# Patient Record
Sex: Female | Born: 1960 | Race: Black or African American | Hispanic: No | Smoking: Current every day smoker
Health system: Southern US, Community
[De-identification: ages and names within clinical notes are randomized; demographics above are authoritative.]

## PROBLEM LIST (undated history)

## (undated) DIAGNOSIS — E119 Type 2 diabetes mellitus without complications: Secondary | ICD-10-CM

## (undated) DIAGNOSIS — I10 Essential (primary) hypertension: Secondary | ICD-10-CM

---

## 2000-10-21 ENCOUNTER — Emergency Department (HOSPITAL_COMMUNITY): Admission: EM | Admit: 2000-10-21 | Discharge: 2000-10-21 | Payer: Self-pay | Admitting: Emergency Medicine

## 2001-04-04 ENCOUNTER — Encounter: Payer: Self-pay | Admitting: Emergency Medicine

## 2001-04-04 ENCOUNTER — Emergency Department (HOSPITAL_COMMUNITY): Admission: EM | Admit: 2001-04-04 | Discharge: 2001-04-04 | Payer: Self-pay | Admitting: Emergency Medicine

## 2002-02-25 ENCOUNTER — Emergency Department (HOSPITAL_COMMUNITY): Admission: EM | Admit: 2002-02-25 | Discharge: 2002-02-25 | Payer: Self-pay | Admitting: *Deleted

## 2004-05-08 ENCOUNTER — Emergency Department (HOSPITAL_COMMUNITY): Admission: EM | Admit: 2004-05-08 | Discharge: 2004-05-08 | Payer: Self-pay | Admitting: *Deleted

## 2004-07-21 ENCOUNTER — Emergency Department (HOSPITAL_COMMUNITY): Admission: EM | Admit: 2004-07-21 | Discharge: 2004-07-21 | Payer: Self-pay | Admitting: Emergency Medicine

## 2004-08-25 ENCOUNTER — Emergency Department (HOSPITAL_COMMUNITY): Admission: EM | Admit: 2004-08-25 | Discharge: 2004-08-26 | Payer: Self-pay | Admitting: Emergency Medicine

## 2005-09-23 ENCOUNTER — Emergency Department (HOSPITAL_COMMUNITY): Admission: EM | Admit: 2005-09-23 | Discharge: 2005-09-23 | Payer: Self-pay | Admitting: *Deleted

## 2007-04-12 ENCOUNTER — Ambulatory Visit: Payer: Self-pay | Admitting: Family Medicine

## 2007-04-12 ENCOUNTER — Ambulatory Visit: Payer: Self-pay | Admitting: *Deleted

## 2007-05-12 ENCOUNTER — Ambulatory Visit: Payer: Self-pay | Admitting: Family Medicine

## 2007-05-12 LAB — CONVERTED CEMR LAB
ALT: 12 units/L (ref 0–35)
AST: 15 units/L (ref 0–37)
Albumin: 4 g/dL (ref 3.5–5.2)
Basophils Absolute: 0 10*3/uL (ref 0.0–0.1)
CO2: 25 meq/L (ref 19–32)
Calcium: 9.4 mg/dL (ref 8.4–10.5)
Creatinine, Ser: 0.85 mg/dL (ref 0.40–1.20)
HCT: 49 % — ABNORMAL HIGH (ref 36.0–46.0)
HDL: 52 mg/dL (ref >39)
Lymphocytes Relative: 37 % (ref 12–46)
MCHC: 32.2 g/dL (ref 30.0–36.0)
MCV: 106.1 fL — ABNORMAL HIGH (ref 78.0–100.0)
Monocytes Absolute: 0.6 10*3/uL (ref 0.2–0.7)
Monocytes Relative: 9 % (ref 3–11)
Neutro Abs: 3.4 10*3/uL (ref 1.7–7.7)
Potassium: 5.1 meq/L (ref 3.5–5.3)
RDW: 14.6 % — ABNORMAL HIGH (ref 11.5–14.0)
TSH: 1.493 microintl units/mL (ref 0.350–5.50)
Total CHOL/HDL Ratio: 4.3
Triglycerides: 300 mg/dL — ABNORMAL HIGH (ref <150)

## 2008-08-06 ENCOUNTER — Emergency Department (HOSPITAL_COMMUNITY): Admission: EM | Admit: 2008-08-06 | Discharge: 2008-08-07 | Payer: Self-pay | Admitting: Emergency Medicine

## 2008-11-19 ENCOUNTER — Emergency Department (HOSPITAL_COMMUNITY): Admission: EM | Admit: 2008-11-19 | Discharge: 2008-11-19 | Payer: Self-pay | Admitting: Emergency Medicine

## 2008-12-10 ENCOUNTER — Ambulatory Visit: Payer: Self-pay | Admitting: Internal Medicine

## 2009-10-02 ENCOUNTER — Emergency Department (HOSPITAL_COMMUNITY): Admission: EM | Admit: 2009-10-02 | Discharge: 2009-10-02 | Payer: Self-pay | Admitting: Family Medicine

## 2009-11-12 ENCOUNTER — Emergency Department (HOSPITAL_COMMUNITY): Admission: EM | Admit: 2009-11-12 | Discharge: 2009-11-12 | Payer: Self-pay | Admitting: Family Medicine

## 2009-11-26 ENCOUNTER — Ambulatory Visit: Payer: Self-pay | Admitting: Family Medicine

## 2009-11-26 ENCOUNTER — Encounter (INDEPENDENT_AMBULATORY_CARE_PROVIDER_SITE_OTHER): Payer: Self-pay | Admitting: Adult Health

## 2009-11-26 ENCOUNTER — Ambulatory Visit: Payer: Self-pay | Admitting: Internal Medicine

## 2009-11-26 LAB — CONVERTED CEMR LAB
BUN: 12 mg/dL (ref 6–23)
CO2: 28 meq/L (ref 19–32)
Cholesterol: 281 mg/dL — ABNORMAL HIGH (ref 0–200)
HDL: 50 mg/dL (ref >39)
LDL Cholesterol: 180 mg/dL — ABNORMAL HIGH (ref 0–99)
Microalb, Ur: 4.8 mg/dL — ABNORMAL HIGH (ref 0.00–1.89)
Sodium: 138 meq/L (ref 135–145)
Total Bilirubin: 0.7 mg/dL (ref 0.3–1.2)
Triglycerides: 257 mg/dL — ABNORMAL HIGH (ref <150)
VLDL: 51 mg/dL — ABNORMAL HIGH (ref 0–40)

## 2010-01-28 ENCOUNTER — Ambulatory Visit: Payer: Self-pay | Admitting: Family Medicine

## 2010-01-28 LAB — CONVERTED CEMR LAB
BUN: 15 mg/dL (ref 6–23)
Chloride: 101 meq/L (ref 96–112)
Sodium: 140 meq/L (ref 135–145)
Vit D, 25-Hydroxy: 20 ng/mL — ABNORMAL LOW (ref 30–89)

## 2010-06-22 ENCOUNTER — Inpatient Hospital Stay (HOSPITAL_COMMUNITY): Admission: EM | Admit: 2010-06-22 | Discharge: 2010-06-24 | Payer: Self-pay | Admitting: Emergency Medicine

## 2010-10-09 ENCOUNTER — Inpatient Hospital Stay (INDEPENDENT_AMBULATORY_CARE_PROVIDER_SITE_OTHER)
Admission: RE | Admit: 2010-10-09 | Discharge: 2010-10-09 | Disposition: A | Discharge status: Home / Self Care | Payer: Self-pay | Source: Ambulatory Visit | Attending: Emergency Medicine | Admitting: Emergency Medicine

## 2010-10-09 DIAGNOSIS — I10 Essential (primary) hypertension: Secondary | ICD-10-CM

## 2010-10-09 DIAGNOSIS — Z760 Encounter for issue of repeat prescription: Secondary | ICD-10-CM

## 2010-10-21 LAB — CBC
HCT: 40.5 % (ref 36.0–46.0)
Hemoglobin: 13.9 g/dL (ref 12.0–15.0)
MCH: 33.3 pg (ref 26.0–34.0)
MCV: 96.8 fL (ref 78.0–100.0)
MCV: 96.8 fL (ref 78.0–100.0)
Platelets: 314 10*3/uL (ref 150–400)
RBC: 4.18 MIL/uL (ref 3.87–5.11)
RBC: 4.57 MIL/uL (ref 3.87–5.11)
RDW: 13.2 % (ref 11.5–15.5)
WBC: 8.2 10*3/uL (ref 4.0–10.5)
WBC: 8.7 10*3/uL (ref 4.0–10.5)

## 2010-10-21 LAB — BASIC METABOLIC PANEL
BUN: 3 mg/dL — ABNORMAL LOW (ref 6–23)
CO2: 26 mEq/L (ref 19–32)
CO2: 30 mEq/L (ref 19–32)
Calcium: 9.5 mg/dL (ref 8.4–10.5)
Chloride: 101 mEq/L (ref 96–112)
Creatinine, Ser: 0.87 mg/dL (ref 0.4–1.2)
GFR calc Af Amer: >60 mL/min (ref >60)
GFR calc non Af Amer: >60 mL/min (ref >60)
GFR calc non Af Amer: >60 mL/min (ref >60)
Glucose, Bld: 125 mg/dL — ABNORMAL HIGH (ref 70–99)
Potassium: 3.1 mEq/L — ABNORMAL LOW (ref 3.5–5.1)

## 2010-10-21 LAB — BLOOD GAS, ARTERIAL
Acid-Base Excess: 2.9 mmol/L — ABNORMAL HIGH (ref 0.0–2.0)
Bicarbonate: 25.6 mEq/L — ABNORMAL HIGH (ref 20.0–24.0)
Drawn by: 30996
FIO2: 0.21 %
O2 Saturation: 87.6 %
pCO2 arterial: 34.4 mmHg — ABNORMAL LOW (ref 35.0–45.0)
pO2, Arterial: 51.9 mmHg — ABNORMAL LOW (ref 80.0–100.0)

## 2010-10-21 LAB — DIFFERENTIAL
Monocytes Relative: 8 % (ref 3–12)
Neutro Abs: 5.7 10*3/uL (ref 1.7–7.7)
Neutrophils Relative %: 66 % (ref 43–77)

## 2010-10-21 LAB — BRAIN NATRIURETIC PEPTIDE: Pro B Natriuretic peptide (BNP): 65.5 pg/mL (ref 0.0–100.0)

## 2011-05-14 LAB — URINE MICROSCOPIC-ADD ON

## 2011-05-14 LAB — POCT I-STAT, CHEM 8
BUN: 4 mg/dL — ABNORMAL LOW (ref 6–23)
Creatinine, Ser: 1 mg/dL (ref 0.4–1.2)
Glucose, Bld: 103 mg/dL — ABNORMAL HIGH (ref 70–99)
Hemoglobin: 17 g/dL — ABNORMAL HIGH (ref 12.0–15.0)
Sodium: 140 mEq/L (ref 135–145)

## 2011-05-14 LAB — URINALYSIS, ROUTINE W REFLEX MICROSCOPIC
Ketones, ur: NEGATIVE mg/dL
Protein, ur: NEGATIVE mg/dL
Urobilinogen, UA: 1 mg/dL (ref 0.0–1.0)
pH: 6.5 (ref 5.0–8.0)

## 2013-03-30 ENCOUNTER — Emergency Department (HOSPITAL_COMMUNITY)
Admission: EM | Admit: 2013-03-30 | Discharge: 2013-03-30 | Disposition: A | Discharge status: Home / Self Care | Payer: No Typology Code available for payment source | Attending: Emergency Medicine | Admitting: Emergency Medicine

## 2013-03-30 ENCOUNTER — Encounter (HOSPITAL_COMMUNITY): Payer: Self-pay | Admitting: *Deleted

## 2013-03-30 DIAGNOSIS — M25519 Pain in unspecified shoulder: Secondary | ICD-10-CM | POA: Insufficient documentation

## 2013-03-30 DIAGNOSIS — M25511 Pain in right shoulder: Secondary | ICD-10-CM

## 2013-03-30 DIAGNOSIS — E119 Type 2 diabetes mellitus without complications: Secondary | ICD-10-CM | POA: Insufficient documentation

## 2013-03-30 DIAGNOSIS — M79605 Pain in left leg: Secondary | ICD-10-CM

## 2013-03-30 DIAGNOSIS — Z79899 Other long term (current) drug therapy: Secondary | ICD-10-CM | POA: Insufficient documentation

## 2013-03-30 DIAGNOSIS — M549 Dorsalgia, unspecified: Secondary | ICD-10-CM | POA: Insufficient documentation

## 2013-03-30 DIAGNOSIS — F17200 Nicotine dependence, unspecified, uncomplicated: Secondary | ICD-10-CM | POA: Insufficient documentation

## 2013-03-30 DIAGNOSIS — M79609 Pain in unspecified limb: Secondary | ICD-10-CM | POA: Insufficient documentation

## 2013-03-30 DIAGNOSIS — I10 Essential (primary) hypertension: Secondary | ICD-10-CM | POA: Insufficient documentation

## 2013-03-30 HISTORY — DX: Type 2 diabetes mellitus without complications: E11.9

## 2013-03-30 HISTORY — DX: Essential (primary) hypertension: I10

## 2013-03-30 MED ORDER — TRAMADOL HCL 50 MG PO TABS: 50.0000 mg | ORAL_TABLET | Freq: Four times a day (QID) | ORAL | Status: DC | PRN

## 2013-03-30 NOTE — ED Provider Notes (Signed)
CSN: [id number]     Arrival date & time 03/30/13  1626 History  This chart was scribed for non-physician practitioner, Sharilyn Sites, PA-C working with Raeford Razor, MD by Danella Maiers, ED Scribe. This patient was seen in room TR11C/TR11C and the patient's care was started at 5:05 PM.    Chief Complaint  Patient presents with  . Shoulder Pain  . Leg Pain   The history is provided by the patient. No language interpreter was used.   HPI Comments: Darlene Pugh is a 52 y.o. female who presents to the Emergency Department complaining of gradual onset, constant throbbing right shoulder pain that radiates to the front of her arm and her back onset 4 days ago. Pt reports no injury, trauma, or falls.  States she has been lifting the hood of her car a lot recently. She reports pain is made worse by movement and she has been unable to lift objects. She denies numbness and tingling. Pt expresses concern that it might be due to muscle strain. Pt reports having similar pain in her left thigh but denies having trouble ambulating. She has tried OTC pain medication and a heat patch with little relief.    Past Medical History  Diagnosis Date  . Diabetes mellitus without complication   . Hypertension    History reviewed. No pertinent past surgical history. History reviewed. No pertinent family history. History  Substance Use Topics  . Smoking status: Current Every Day Smoker    Types: Cigarettes  . Smokeless tobacco: Not on file  . Alcohol Use: No   OB History   Grav Para Term Preterm Abortions TAB SAB Ect Mult Living                 Review of Systems  Musculoskeletal: Positive for back pain and arthralgias (right shoulder and left leg pain).  Neurological: Negative for numbness.  All other systems reviewed and are negative.    Allergies  Review of patient's allergies indicates no known allergies.  Home Medications  No current outpatient prescriptions on file.  BP 115/57   Pulse 105  Temp(Src) 98.1 F (36.7 C) (Oral)  Resp 18  SpO2 97%  Physical Exam  Nursing note and vitals reviewed. Constitutional: She is oriented to person, place, and time. She appears well-developed and well-nourished. No distress.  HENT:  Head: Normocephalic and atraumatic.  Eyes: Conjunctivae and EOM are normal.  Neck: Normal range of motion. Neck supple.  Cardiovascular: Normal rate, regular rhythm and normal heart sounds.   Pulmonary/Chest: Effort normal and breath sounds normal. No respiratory distress. She has no wheezes.  Musculoskeletal:       Right shoulder: She exhibits decreased range of motion, tenderness and pain. She exhibits no bony tenderness, no swelling, no effusion, no crepitus, no deformity, no laceration and no spasm.  Right shoulder TTP; no deformity, swelling, bruising, or other signs of trauma; strong radial pulse, sensation intact Left thigh pain without focal TTP; no deformity noted; distal sensation intact, normal gait  Neurological: She is alert and oriented to person, place, and time.  Skin: Skin is warm and dry. She is not diaphoretic.  Psychiatric: She has a normal mood and affect.    ED Course  Medications - No data to display  DIAGNOSTIC STUDIES: Oxygen Saturation is 97% on room air, normal by my interpretation.    COORDINATION OF CARE: 5:10 PM- Discussed treatment plan with pt which includes pain meds and warm compresses and pt agrees to plan.  Procedures (including critical care time)  Labs Reviewed - No data to display No results found.  1. Shoulder pain, right   2. Left leg pain     MDM   Doubt acute fx or dislocation of shoulder or left femur, likely muscle soreness.  Rx tramadol.  Instructed to continue using heat patches at home to help with pain.  Pt will FU with [facility] if no improvement in the next few days.  Discussed plan with pt, they agreed.  Return precautions advised.   I personally performed the services  described in this documentation, which was scribed in my presence. The recorded information has been reviewed and is accurate.    Garlon Hatchet, PA-C 03/30/13 2301

## 2013-03-30 NOTE — ED Notes (Signed)
Pt reports right shoulder pain and left leg pain x 4 days, increases with movement, unsure if possible muscle strain. Ambulatory at triage.

## 2013-03-31 ENCOUNTER — Telehealth (HOSPITAL_COMMUNITY): Payer: Self-pay | Admitting: Emergency Medicine

## 2013-03-31 NOTE — Telephone Encounter (Signed)
Pharmacist called to verify prescription for tramadol. It was verified.

## 2013-04-06 NOTE — ED Provider Notes (Signed)
Medical screening examination/treatment/procedure(s) were performed by non-physician practitioner and as supervising physician I was immediately available for consultation/collaboration.  Raeford Razor, MD 04/06/13 [phone]

## 2013-06-01 ENCOUNTER — Ambulatory Visit (HOSPITAL_COMMUNITY)
Admission: RE | Admit: 2013-06-01 | Discharge: 2013-06-01 | Disposition: A | Discharge status: Home / Self Care | Payer: No Typology Code available for payment source | Source: Ambulatory Visit | Attending: Internal Medicine | Admitting: Internal Medicine

## 2013-06-01 ENCOUNTER — Other Ambulatory Visit (HOSPITAL_COMMUNITY): Payer: Self-pay | Admitting: Internal Medicine

## 2013-06-01 DIAGNOSIS — M169 Osteoarthritis of hip, unspecified: Secondary | ICD-10-CM | POA: Insufficient documentation

## 2013-06-01 DIAGNOSIS — R52 Pain, unspecified: Secondary | ICD-10-CM

## 2013-06-01 DIAGNOSIS — M1610 Unilateral primary osteoarthritis, unspecified hip: Secondary | ICD-10-CM | POA: Insufficient documentation

## 2013-06-01 DIAGNOSIS — M25559 Pain in unspecified hip: Secondary | ICD-10-CM | POA: Insufficient documentation

## 2013-06-01 DIAGNOSIS — I998 Other disorder of circulatory system: Secondary | ICD-10-CM | POA: Insufficient documentation

## 2013-10-16 ENCOUNTER — Encounter (HOSPITAL_COMMUNITY): Payer: Self-pay | Admitting: Emergency Medicine

## 2013-10-16 ENCOUNTER — Emergency Department (HOSPITAL_COMMUNITY)
Admission: EM | Admit: 2013-10-16 | Discharge: 2013-10-16 | Disposition: A | Discharge status: Home / Self Care | Payer: No Typology Code available for payment source | Attending: Emergency Medicine | Admitting: Emergency Medicine

## 2013-10-16 DIAGNOSIS — Z792 Long term (current) use of antibiotics: Secondary | ICD-10-CM | POA: Insufficient documentation

## 2013-10-16 DIAGNOSIS — F17200 Nicotine dependence, unspecified, uncomplicated: Secondary | ICD-10-CM | POA: Insufficient documentation

## 2013-10-16 DIAGNOSIS — Z79899 Other long term (current) drug therapy: Secondary | ICD-10-CM | POA: Insufficient documentation

## 2013-10-16 DIAGNOSIS — M79606 Pain in leg, unspecified: Secondary | ICD-10-CM

## 2013-10-16 DIAGNOSIS — M79609 Pain in unspecified limb: Secondary | ICD-10-CM | POA: Insufficient documentation

## 2013-10-16 DIAGNOSIS — I10 Essential (primary) hypertension: Secondary | ICD-10-CM | POA: Insufficient documentation

## 2013-10-16 DIAGNOSIS — G8929 Other chronic pain: Secondary | ICD-10-CM

## 2013-10-16 DIAGNOSIS — M129 Arthropathy, unspecified: Secondary | ICD-10-CM | POA: Insufficient documentation

## 2013-10-16 DIAGNOSIS — E119 Type 2 diabetes mellitus without complications: Secondary | ICD-10-CM | POA: Insufficient documentation

## 2013-10-16 DIAGNOSIS — M25559 Pain in unspecified hip: Secondary | ICD-10-CM | POA: Insufficient documentation

## 2013-10-16 DIAGNOSIS — E669 Obesity, unspecified: Secondary | ICD-10-CM | POA: Insufficient documentation

## 2013-10-16 MED ORDER — NAPROXEN 500 MG PO TABS: 500.0000 mg | ORAL_TABLET | Freq: Two times a day (BID) | ORAL | Status: DC

## 2013-10-16 NOTE — ED Provider Notes (Signed)
Medical screening examination/treatment/procedure(s) were performed by non-physician practitioner and as supervising physician I was immediately available for consultation/collaboration.   EKG Interpretation None        Darlene OctaveStephen Earlie Arciga, MD 10/16/13 1535

## 2013-10-16 NOTE — Discharge Instructions (Signed)
Take naproxen as prescribed. Follow up with your doctor.  Chronic Pain Chronic pain can be defined as pain that is off and on and lasts for 3 6 months or longer. Many things cause chronic pain, which can make it difficult to make a diagnosis. There are many treatment options available for chronic pain. However, finding a treatment that works well for you may require trying various approaches until the right one is found. Many people benefit from a combination of two or more types of treatment to control their pain. SYMPTOMS  Chronic pain can occur anywhere in the body and can range from mild to very severe. Some types of chronic pain include:  Headache.  Low back pain.  Cancer pain.  Arthritis pain.  Neurogenic pain. This is pain resulting from damage to nerves. People with chronic pain may also have other symptoms such as:  Depression.  Anger.  Insomnia.  Anxiety. DIAGNOSIS  Your health care provider will help diagnose your condition over time. In many cases, the initial focus will be on excluding possible conditions that could be causing the pain. Depending on your symptoms, your health care provider may order tests to diagnose your condition. Some of these tests may include:   Blood tests.   CT scan.   MRI.   X-rays.   Ultrasounds.   Nerve conduction studies.  You may need to see a specialist.  TREATMENT  Finding treatment that works well may take time. You may be referred to a pain specialist. He or she may prescribe medicine or therapies, such as:   Mindful meditation or yoga.  Shots (injections) of numbing or pain-relieving medicines into the spine or area of pain.  Local electrical stimulation.  Acupuncture.   Massage therapy.   Aroma, color, light, or sound therapy.   Biofeedback.   Working with a physical therapist to keep from getting stiff.   Regular, gentle exercise.   Cognitive or behavioral therapy.   Group support.   Sometimes, surgery may be recommended.  HOME CARE INSTRUCTIONS   Take all medicines as directed by your health care provider.   Lessen stress in your life by relaxing and doing things such as listening to calming music.   Exercise or be active as directed by your health care provider.   Eat a healthy diet and include things such as vegetables, fruits, fish, and lean meats in your diet.   Keep all follow-up appointments with your health care provider.   Attend a support group with others suffering from chronic pain. SEEK MEDICAL CARE IF:   Your pain gets worse.   You develop a new pain that was not there before.   You cannot tolerate medicines given to you by your health care provider.   You have new symptoms since your last visit with your health care provider.  SEEK IMMEDIATE MEDICAL CARE IF:   You feel weak.   You have decreased sensation or numbness.   You lose control of bowel or bladder function.   Your pain suddenly gets much worse.   You develop shaking.  You develop chills.  You develop confusion.  You develop chest pain.  You develop shortness of breath.  MAKE SURE YOU:  Understand these instructions.  Will watch your condition.  Will get help right away if you are not doing well or get worse. Document Released: 04/18/2002 Document Revised: 03/29/2013 Document Reviewed: 01/20/2013 [facility] Patient Information 2014 [employer] [place].

## 2013-10-16 NOTE — ED Notes (Signed)
Patient states [facility] goes to [facility] and uses them as primary care.

## 2013-10-16 NOTE — Discharge Planning (Signed)
[facility] Felicia E, Community Liaison  [facility] [place]. Patient states she has an upcoming appointment on 10/18/13 with her PCP. Patient was educated on proper use of her orange card, and the importance of following up with her primary care provider vs coming to the Emergency department. My contact information was given for any future questions or concerns.

## 2013-10-16 NOTE — ED Notes (Signed)
Pt presents to department for evaluation of L leg pain. States chronic pain. States she ran out of pain medicine and muscle relaxer. 10/10 pain upon arrival, becomes worse with movement. Also states chronic pain to bilateral knees. Pt is alert and oriented x4. No signs of acute distress noted.

## 2013-10-16 NOTE — ED Provider Notes (Signed)
CSN: [id number]     Arrival date & time 10/16/13  1128 History  This chart was scribed for non-physician practitioner, Johnnette Gourdobyn Albert, PA-C working with Glynn OctaveStephen Rancour, MD by Greggory StallionKayla Andersen, ED scribe. This patient was seen in room TR07C/TR07C and the patient's care was started at 11:56 AM.   Chief Complaint  Patient presents with  . Leg Pain   The history is provided by the patient. No language interpreter was used.   HPI Comments: Darlene Pugh is a 53 y.o. female who presents to the Emergency Department complaining of chronic left leg pain. Denies new injury. Movement worsens the pain. Pt recently ran out the pain medication and muscle relaxer she was previously given by her PCP. She had xrays done and was told she had arthritis. She states she has an appointment with her PCP on 10/18/13 to discuss therapy for her pain. Pt states she has taken a generic PM medication from the [employer] with no relief. Denies back pain.    Past Medical History  Diagnosis Date  . Diabetes mellitus without complication   . Hypertension    History reviewed. No pertinent past surgical history. No family history on file. History  Substance Use Topics  . Smoking status: Current Every Day Smoker    Types: Cigarettes  . Smokeless tobacco: Not on file  . Alcohol Use: No   OB History   Grav Para Term Preterm Abortions TAB SAB Ect Mult Living                 Review of Systems  Musculoskeletal: Positive for arthralgias and myalgias. Negative for back pain.  All other systems reviewed and are negative.   Allergies  Review of patient's allergies indicates no known allergies.  Home Medications   Current Outpatient Rx  Name  Route  Sig  Dispense  Refill  . atorvastatin (LIPITOR) 20 MG tablet   Oral   Take 20 mg by mouth at bedtime.         Marland Kitchen. lisinopril (PRINIVIL,ZESTRIL) 20 MG tablet   Oral   Take 20 mg by mouth daily.         . metFORMIN (GLUCOPHAGE) 500 MG tablet   Oral   Take  500 mg by mouth daily with breakfast.         . minocycline (DYNACIN) 50 MG tablet   Oral   Take 50 mg by mouth 2 (two) times daily.         . naproxen (NAPROSYN) 500 MG tablet   Oral   Take 1 tablet (500 mg total) by mouth 2 (two) times daily.   30 tablet   0   . OVER THE COUNTER MEDICATION   Oral   Take 2 tablets by mouth daily as needed (sleep and pain).         . traMADol (ULTRAM) 50 MG tablet   Oral   Take 1 tablet (50 mg total) by mouth every 6 (six) hours as needed for pain.   15 tablet   0    BP 151/96  Pulse 93  Temp(Src) 98 F (36.7 C) (Oral)  Resp 18  Wt 194 lb 9.6 oz (88.27 kg)  SpO2 98%  Physical Exam  Nursing note and vitals reviewed. Constitutional: She is oriented to person, place, and time. She appears well-developed and well-nourished. No distress.  Obese.   HENT:  Head: Normocephalic and atraumatic.  Mouth/Throat: Oropharynx is clear and moist.  Eyes: Conjunctivae and EOM are normal.  Neck: Normal range of motion. Neck supple.  Cardiovascular: Normal rate, regular rhythm and normal heart sounds.   Pulmonary/Chest: Effort normal and breath sounds normal. No respiratory distress.  Musculoskeletal: Normal range of motion. She exhibits no edema.  Tender to palpation left hip down lateral left leg. Normal gait.   Neurological: She is alert and oriented to person, place, and time. No sensory deficit.  Skin: Skin is warm and dry.  Psychiatric: She has a normal mood and affect. Her behavior is normal.    ED Course  Procedures (including critical care time)  DIAGNOSTIC STUDIES: Oxygen Saturation is 98% on RA, normal by my interpretation.    COORDINATION OF CARE: 11:59 AM-Discussed treatment plan which includes an anti-inflammatory and PCP follow up with pt at bedside and pt agreed to plan.   Labs Review Labs Reviewed - No data to display Imaging Review No results found.   EKG Interpretation None      MDM   Final diagnoses:   Chronic leg pain    Patient presenting with chronic pain, no injury or new symptoms. She is well appearing and in no apparent distress, ambulating without difficulty. She has a followup with her PCP in 2 days for the same. She is told that she has arthritis. Her PCP is trying to wean her off of Flexeril. I will prescribe patient naproxen, followup with PCP. Stable for discharge. Return precautions given. Patient states understanding of treatment care plan and is agreeable.   I personally performed the services described in this documentation, which was scribed in my presence. The recorded information has been reviewed and is accurate.   Trevor Mace, PA-C 10/16/13 1205

## 2013-12-13 ENCOUNTER — Other Ambulatory Visit (HOSPITAL_COMMUNITY): Payer: Self-pay | Admitting: Internal Medicine

## 2013-12-13 ENCOUNTER — Ambulatory Visit (HOSPITAL_COMMUNITY)
Admission: RE | Admit: 2013-12-13 | Discharge: 2013-12-13 | Disposition: A | Discharge status: Home / Self Care | Payer: No Typology Code available for payment source | Source: Ambulatory Visit | Attending: Internal Medicine | Admitting: Internal Medicine

## 2013-12-13 DIAGNOSIS — M25569 Pain in unspecified knee: Secondary | ICD-10-CM

## 2013-12-13 DIAGNOSIS — M25579 Pain in unspecified ankle and joints of unspecified foot: Secondary | ICD-10-CM | POA: Insufficient documentation

## 2014-02-23 ENCOUNTER — Ambulatory Visit (HOSPITAL_COMMUNITY)
Admission: RE | Admit: 2014-02-23 | Discharge: 2014-02-23 | Disposition: A | Discharge status: Home / Self Care | Payer: No Typology Code available for payment source | Source: Ambulatory Visit | Attending: Internal Medicine | Admitting: Internal Medicine

## 2014-02-23 ENCOUNTER — Other Ambulatory Visit (HOSPITAL_COMMUNITY): Payer: Self-pay | Admitting: Internal Medicine

## 2014-02-23 DIAGNOSIS — R52 Pain, unspecified: Secondary | ICD-10-CM | POA: Insufficient documentation

## 2014-02-23 DIAGNOSIS — Q762 Congenital spondylolisthesis: Secondary | ICD-10-CM | POA: Insufficient documentation

## 2015-10-18 ENCOUNTER — Encounter (HOSPITAL_COMMUNITY): Payer: Self-pay | Admitting: Emergency Medicine

## 2015-10-18 ENCOUNTER — Emergency Department (HOSPITAL_COMMUNITY)
Admission: EM | Admit: 2015-10-18 | Discharge: 2015-10-18 | Disposition: A | Discharge status: Home / Self Care | Payer: Medicaid Other | Attending: Emergency Medicine | Admitting: Emergency Medicine

## 2015-10-18 DIAGNOSIS — M25552 Pain in left hip: Secondary | ICD-10-CM | POA: Insufficient documentation

## 2015-10-18 DIAGNOSIS — E119 Type 2 diabetes mellitus without complications: Secondary | ICD-10-CM | POA: Insufficient documentation

## 2015-10-18 DIAGNOSIS — Z760 Encounter for issue of repeat prescription: Secondary | ICD-10-CM | POA: Diagnosis not present

## 2015-10-18 DIAGNOSIS — Z7984 Long term (current) use of oral hypoglycemic drugs: Secondary | ICD-10-CM | POA: Diagnosis not present

## 2015-10-18 DIAGNOSIS — G8929 Other chronic pain: Secondary | ICD-10-CM | POA: Insufficient documentation

## 2015-10-18 DIAGNOSIS — R0989 Other specified symptoms and signs involving the circulatory and respiratory systems: Secondary | ICD-10-CM | POA: Diagnosis not present

## 2015-10-18 DIAGNOSIS — Z79899 Other long term (current) drug therapy: Secondary | ICD-10-CM | POA: Insufficient documentation

## 2015-10-18 DIAGNOSIS — M25562 Pain in left knee: Secondary | ICD-10-CM | POA: Insufficient documentation

## 2015-10-18 DIAGNOSIS — I10 Essential (primary) hypertension: Secondary | ICD-10-CM | POA: Diagnosis not present

## 2015-10-18 DIAGNOSIS — Z791 Long term (current) use of non-steroidal anti-inflammatories (NSAID): Secondary | ICD-10-CM | POA: Diagnosis not present

## 2015-10-18 DIAGNOSIS — M549 Dorsalgia, unspecified: Secondary | ICD-10-CM | POA: Insufficient documentation

## 2015-10-18 DIAGNOSIS — Z792 Long term (current) use of antibiotics: Secondary | ICD-10-CM | POA: Diagnosis not present

## 2015-10-18 DIAGNOSIS — F17210 Nicotine dependence, cigarettes, uncomplicated: Secondary | ICD-10-CM | POA: Insufficient documentation

## 2015-10-18 MED ORDER — MELOXICAM 7.5 MG PO TABS: 15.0000 mg | ORAL_TABLET | Freq: Every day | ORAL | Status: DC

## 2015-10-18 MED ORDER — METFORMIN HCL 500 MG PO TABS: 500.0000 mg | ORAL_TABLET | Freq: Every day | ORAL | Status: DC

## 2015-10-18 MED ORDER — LISINOPRIL 20 MG PO TABS: 20.0000 mg | ORAL_TABLET | Freq: Every day | ORAL | Status: DC

## 2015-10-18 MED ORDER — ATORVASTATIN CALCIUM 20 MG PO TABS: 20.0000 mg | ORAL_TABLET | Freq: Every day | ORAL | Status: DC

## 2015-10-18 NOTE — ED Provider Notes (Signed)
CSN: [id number]     Arrival date & time 10/18/15  1049 History  By signing my name below, I, Darlene Pugh, attest that this documentation has been prepared under the direction and in the presence of Joycie PeekBenjamin Danise Dehne, PA-C Electronically Signed: Charline BillsEssence Pugh, ED Scribe 10/18/2015 at 2:00 PM.   Chief Complaint  Patient presents with  . Hip Pain  . Back Pain   The history is provided by the patient. No language interpreter was used.   HPI Comments: Darlene PupaJacqueline L Pugh is a 55 y.o. female, with a h/o chronic back pain and osteoarthritis, who presents to the Emergency Department complaining of left hip pain that radiates into left leg for several months. Pt reports worsened pain with lying on her left side. She is followed by PCP: Darlene BenderEric L Dean, MD. Pt missed her appointment with her PCP last week but has an upcoming appointment on 11/27/15. She reports h/o neuropathy and has tried Gabapentin and Celebrex to manage pain. Pt has not had her medication since 07/30/15 due to finances. She was approved for an assistance program with social services but states that her PCP has to fax her medications to them. She denies abdominal pain, nausea, vomiting, diarrhea.   Past Medical History  Diagnosis Date  . Diabetes mellitus without complication (HCC)   . Hypertension    History reviewed. No pertinent past surgical history. No family history on file. Social History  Substance Use Topics  . Smoking status: Current Every Day Smoker    Types: Cigarettes  . Smokeless tobacco: None  . Alcohol Use: No   OB History    No data available     Review of Systems  Gastrointestinal: Negative for nausea, vomiting, abdominal pain and diarrhea.  Musculoskeletal: Positive for back pain and arthralgias.  All other systems reviewed and are negative.  Allergies  Review of patient's allergies indicates no known allergies.  Home Medications   Prior to Admission medications   Medication Sig Start Date End  Date Taking? Authorizing Provider  atorvastatin (LIPITOR) 20 MG tablet Take 20 mg by mouth at bedtime.    Historical Provider, MD  lisinopril (PRINIVIL,ZESTRIL) 20 MG tablet Take 20 mg by mouth daily.    Historical Provider, MD  metFORMIN (GLUCOPHAGE) 500 MG tablet Take 500 mg by mouth daily with breakfast.    Historical Provider, MD  minocycline (DYNACIN) 50 MG tablet Take 50 mg by mouth 2 (two) times daily. 03/15/13   Historical Provider, MD  naproxen (NAPROSYN) 500 MG tablet Take 1 tablet (500 mg total) by mouth 2 (two) times daily. 10/16/13   Robyn M Hess, PA-C  OVER THE COUNTER MEDICATION Take 2 tablets by mouth daily as needed (sleep and pain).    Historical Provider, MD  traMADol (ULTRAM) 50 MG tablet Take 1 tablet (50 mg total) by mouth every 6 (six) hours as needed for pain. 03/30/13   Darlene HatchetLisa M Sanders, PA-C   BP 158/94 mmHg  Pulse 90  Temp(Src) 98.1 F (36.7 C) (Oral)  Resp 18  Wt 193 lb 3.2 oz (87.635 kg)  SpO2 97% Physical Exam  Constitutional: She is oriented to person, place, and time. She appears well-developed and well-nourished. No distress.  HENT:  Head: Normocephalic and atraumatic.  Eyes: Conjunctivae and EOM are normal.  Neck: Neck supple. No tracheal deviation present.  Cardiovascular: Normal rate, regular rhythm and normal heart sounds.   Pulmonary/Chest: Effort normal. No respiratory distress. She has rhonchi (mild diffuse).  Abdominal: Soft. There is no tenderness.  Musculoskeletal: Normal range of motion.  Neurological: She is alert and oriented to person, place, and time.  Skin: Skin is warm and dry.  Psychiatric: She has a normal mood and affect. Her behavior is normal.  Nursing note and vitals reviewed.  ED Course  Procedures (including critical care time) DIAGNOSTIC STUDIES: Oxygen Saturation is 97% on RA, normal by my interpretation.    COORDINATION OF CARE: 1:52 PM-Discussed treatment plan which includes Mobic with pt at bedside and pt agreed to plan.    Labs Review Labs Reviewed - No data to display  Imaging Review No results found.   EKG Interpretation None     Meds given in ED:  Medications - No data to display  Discharge Medication List as of 10/18/2015  2:30 PM    START taking these medications   Details  meloxicam (MOBIC) 7.5 MG tablet Take 2 tablets (15 mg total) by mouth daily., Starting 10/18/2015, Until Discontinued, Print       Filed Vitals:   10/18/15 1126 10/18/15 1435  BP: 158/94 155/92  Pulse: 90 75  Temp: 98.1 F (36.7 C)   TempSrc: Oral   Resp: 18 16  Weight: 87.635 kg   SpO2: 97% 98%    MDM  Darlene Pugh Pugh is a 55 y.o. female history of diabetes and hypertension comes in for medication refill. Patient reports chronic left leg and hip pain, exacerbated since she has run out of her medications. Patient reports she has not had any of her medications since December 2016. She has been unable to follow up with her PCP. Stress importance of her following up with PCP for medication reconciliation. No objective findings on exam today. She is hemodynamically stable and afebrile and overall appears well. Will DC with prescription refill for metformin, lisinopril, atorvastatin and meloxicam for chronic leg pain. Patient verbalizes understanding that she will need follow-up with PCP next week for reevaluation and subsequent medication reconciliation. Discussed return precautions. Appropriate for discharge. Final diagnoses:  Left knee pain  Left hip pain  Chronic pain  Medication refill    I personally performed the services described in this documentation, which was scribed in my presence. The recorded information has been reviewed and is accurate.   Joycie Peek, PA-C 10/18/15 1455  Laurence Spates, MD 10/20/15 [phone]

## 2015-10-18 NOTE — Discharge Instructions (Signed)
It is important for you to follow-up with your doctor for further management of your medications. You need to call him today in order to schedule an appointment. You were given a refill of your lisinopril for blood pressure, atorvastatin for cholesterol, metformin for diabetes, meloxicam for your chronic knee and hip pain. Return to ED for new or worsening symptoms as we discussed.

## 2015-10-18 NOTE — ED Notes (Signed)
Pt c/o left hip and leg pain. Also c/o right wrist/arm pain with numbness to fingers. States has had this problem several months. [facility] Has multiple unfilled Rx's in hand from 07/26/2015. States she has been trying to get social services to pay for them.States has had NO MEDICINE since 07/2015.

## 2015-10-18 NOTE — ED Notes (Signed)
Pt c/o left hip and back pain ongoing for "a little while." Pt also c/o sharp pain to left eye ongoing before she got glasses. Pt has history of arthritis of hip and pain radiates down the left leg.

## 2015-12-01 ENCOUNTER — Emergency Department (HOSPITAL_COMMUNITY)
Admission: EM | Admit: 2015-12-01 | Discharge: 2015-12-01 | Disposition: A | Discharge status: Home / Self Care | Payer: Medicaid Other | Attending: Emergency Medicine | Admitting: Emergency Medicine

## 2015-12-01 ENCOUNTER — Encounter (HOSPITAL_COMMUNITY): Payer: Self-pay | Admitting: Nurse Practitioner

## 2015-12-01 DIAGNOSIS — Z792 Long term (current) use of antibiotics: Secondary | ICD-10-CM | POA: Diagnosis not present

## 2015-12-01 DIAGNOSIS — Z791 Long term (current) use of non-steroidal anti-inflammatories (NSAID): Secondary | ICD-10-CM | POA: Diagnosis not present

## 2015-12-01 DIAGNOSIS — I10 Essential (primary) hypertension: Secondary | ICD-10-CM | POA: Insufficient documentation

## 2015-12-01 DIAGNOSIS — E119 Type 2 diabetes mellitus without complications: Secondary | ICD-10-CM | POA: Diagnosis not present

## 2015-12-01 DIAGNOSIS — F17210 Nicotine dependence, cigarettes, uncomplicated: Secondary | ICD-10-CM | POA: Diagnosis not present

## 2015-12-01 DIAGNOSIS — M79602 Pain in left arm: Secondary | ICD-10-CM | POA: Diagnosis not present

## 2015-12-01 DIAGNOSIS — Z79899 Other long term (current) drug therapy: Secondary | ICD-10-CM | POA: Insufficient documentation

## 2015-12-01 DIAGNOSIS — Z7984 Long term (current) use of oral hypoglycemic drugs: Secondary | ICD-10-CM | POA: Diagnosis not present

## 2015-12-01 MED ORDER — NAPROXEN 500 MG PO TABS: 500.0000 mg | ORAL_TABLET | Freq: Two times a day (BID) | ORAL | Status: DC

## 2015-12-01 MED ORDER — METHOCARBAMOL 500 MG PO TABS: 500.0000 mg | ORAL_TABLET | Freq: Two times a day (BID) | ORAL | Status: DC

## 2015-12-01 NOTE — ED Notes (Signed)
Declined W/C at D/C and was escorted to lobby by RN. 

## 2015-12-01 NOTE — Discharge Instructions (Signed)

## 2015-12-01 NOTE — ED Notes (Signed)
She c/o 2 week history of L arm pain from shoulder to wrist. Onset after lifting grocery bags. Pain increased with lifting and moving. She tried muscle rub and icy hot with no relief. Describes as a "pulling pain."

## 2015-12-01 NOTE — ED Provider Notes (Signed)
CSN: [id number]     Arrival date & time 12/01/15  1242 History  By signing my name below, I, Darlene Pugh, attest that this documentation has been prepared under the direction and in the presence of Alveta HeimlichStevi Namine Beahm, PA-C Electronically Signed: Soijett Pugh, ED Scribe. 12/01/2015. 2:16 PM.   Chief Complaint  Patient presents with  . Arm Pain   The history is provided by the patient. No language interpreter was used.    Darlene Pugh is a 55 y.o. female with a medical hx of DM and HTN, who presents to the Emergency Department complaining of left arm pain onset 2 weeks. Pt reports that her left arm pain radiates from her left shoulder to her left wrist with a sharp, pulling sensation. The pain is most prominent over her left bicep. Pt states that she was lifting heavy groceries prior to the onset of her symptoms. She states her left arm was mildly painful after that episode and then she lifted her granddaughter one week ago which caused an increase in pain. Pt denies any other injury or trauma to the area. Pt states that her left arm pain is increased with lifting and movement, denies any alleviating factors. She notes that she has tried OTC muscle rub and icy hot with no relief of her symptoms. Pt had similar symptoms to her right arm 2 years ago that was contributed to heavy lifting and overuse. 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 She saw her PCP 2 days ago and mentioned the arm pain to him but did not see him for this reason. She denies color change, wound, rash, swelling, numbness, tingling, weakness, neck pain and any other symptoms. Pt states that when she saw her PCP Friday, he was going to refer her to an orthopedist for further evaluation of her symptoms.   Past Medical History  Diagnosis Date  . Diabetes mellitus without complication (HCC)   . Hypertension    History reviewed. No pertinent past surgical history. History reviewed. No pertinent  family history. Social History  Substance Use Topics  . Smoking status: Current Every Day Smoker    Types: Cigarettes  . Smokeless tobacco: None  . Alcohol Use: No   OB History    No data available     Review of Systems  Musculoskeletal: Positive for myalgias and arthralgias. Negative for joint swelling.  Skin: Negative for color change, rash and wound.  Neurological: Negative for weakness and numbness.       No tingling  All other systems reviewed and are negative.     Allergies  Review of patient's allergies indicates no known allergies.  Home Medications   Prior to Admission medications   Medication Sig Start Date End Date Taking? Authorizing Provider  atorvastatin (LIPITOR) 20 MG tablet Take 1 tablet (20 mg total) by mouth at bedtime. 10/18/15   Joycie PeekBenjamin Cartner, PA-C  lisinopril (PRINIVIL,ZESTRIL) 20 MG tablet Take 1 tablet (20 mg total) by mouth daily. 10/18/15   Joycie PeekBenjamin Cartner, PA-C  meloxicam (MOBIC) 7.5 MG tablet Take 2 tablets (15 mg total) by mouth daily. 10/18/15   Joycie PeekBenjamin Cartner, PA-C  metFORMIN (GLUCOPHAGE) 500 MG tablet Take 1 tablet (500 mg total) by mouth daily with breakfast. 10/18/15   Joycie PeekBenjamin Cartner, PA-C  methocarbamol (ROBAXIN) 500 MG tablet Take 1 tablet (500 mg total) by mouth 2 (two) times daily. 12/01/15   Trana Ressler, PA-C  minocycline (DYNACIN) 50 MG tablet Take 50 mg by mouth 2 (two)  times daily. 03/15/13   Historical Provider, MD  naproxen (NAPROSYN) 500 MG tablet Take 1 tablet (500 mg total) by mouth 2 (two) times daily. 10/16/13   Robyn M Hess, PA-C  naproxen (NAPROSYN) 500 MG tablet Take 1 tablet (500 mg total) by mouth 2 (two) times daily. 12/01/15   Safaa Stingley, PA-C  OVER THE COUNTER MEDICATION Take 2 tablets by mouth daily as needed (sleep and pain).    Historical Provider, MD  traMADol (ULTRAM) 50 MG tablet Take 1 tablet (50 mg total) by mouth every 6 (six) hours as needed for pain. 03/30/13   Garlon Hatchet, PA-C   BP 118/88 mmHg  Pulse  86  Temp(Src) 97.7 F (36.5 C) (Oral)  Resp 16  Ht  (1.575 m)  Wt 87.091 kg  BMI 35.11 kg/m2  SpO2 100% Physical Exam  Constitutional: She appears well-developed and well-nourished. No distress.  HENT:  Head: Normocephalic and atraumatic.  Right Ear: External ear normal.  Left Ear: External ear normal.  Eyes: Conjunctivae are normal. Right eye exhibits no discharge. Left eye exhibits no discharge. No scleral icterus.  Neck: Normal range of motion.    Tenderness over left superior portion of trapezius muscle. No focal tenderness of cervical spine. No bony deformities of cervical spine. FROM intact.   Cardiovascular: Normal rate.   Radial pulse palpable with cap refill less than 2 seconds  Pulmonary/Chest: Effort normal.  Musculoskeletal: Normal range of motion.  No tenderness over the left humeral head or shoulder girdle. Mild TTP over biceps muscle belly. No tenderness over bicep tendon insertion sites. FROM of left shoulder, elbow, wrist, and digits intact. Moves all extremities spontaneously and walks with a steady gait  Neurological: She is alert. Coordination normal.  5/5 strength of LUE. Sensation to light touch intact throughout  Skin: Skin is warm and dry.  Psychiatric: She has a normal mood and affect. Her behavior is normal.  Nursing note and vitals reviewed.   ED Course  Procedures (including critical care time) DIAGNOSTIC STUDIES: Oxygen Saturation is 100% on RA, nl by my interpretation.    COORDINATION OF CARE: 2:13 PM Discussed treatment plan with pt at bedside and pt agreed to plan.    Labs Review Labs Reviewed - No data to display  Imaging Review No results found.    EKG Interpretation None      MDM   Final diagnoses:  Left arm pain   55 year old female presenting with left arm pain after lifting groceries and then her grandchild. Pain persistent for 2 weeks. Left arm is neurovascularly intact with full range of motion. Mild tenderness  over biceps muscle but no tenderness at insertion site. Mild tenderness over left trapezius muscle. No indication for imaging at this time. Presentation consistent with musculoskeletal pain. No neuro deficits. Will discharge with Robaxin and Naprosyn. Encouraged patient to keep her orthopedics follow-up. Return precautions given in discharge paperwork and discussed with pt at bedside. Pt stable for discharge  I personally performed the services described in this documentation, which was scribed in my presence. The recorded information has been reviewed and is accurate.    Rolm Gala Tyesha Joffe, PA-C 12/01/15 1614  Pricilla Loveless, MD 12/02/15 [phone]

## 2016-05-26 ENCOUNTER — Other Ambulatory Visit (HOSPITAL_COMMUNITY): Payer: Self-pay | Admitting: Internal Medicine

## 2016-05-26 ENCOUNTER — Ambulatory Visit (HOSPITAL_COMMUNITY)
Admission: RE | Admit: 2016-05-26 | Discharge: 2016-05-26 | Disposition: A | Discharge status: Home / Self Care | Payer: No Typology Code available for payment source | Source: Ambulatory Visit | Attending: Internal Medicine | Admitting: Internal Medicine

## 2016-05-26 DIAGNOSIS — R069 Unspecified abnormalities of breathing: Secondary | ICD-10-CM | POA: Insufficient documentation

## 2016-05-26 DIAGNOSIS — Z0000 Encounter for general adult medical examination without abnormal findings: Secondary | ICD-10-CM | POA: Insufficient documentation

## 2016-05-26 DIAGNOSIS — I700 Atherosclerosis of aorta: Secondary | ICD-10-CM | POA: Insufficient documentation

## 2016-06-09 ENCOUNTER — Ambulatory Visit: Payer: Medicaid Other | Attending: Orthopedic Surgery | Admitting: Physical Therapy

## 2016-06-09 DIAGNOSIS — M7062 Trochanteric bursitis, left hip: Secondary | ICD-10-CM | POA: Diagnosis present

## 2016-06-09 DIAGNOSIS — M6281 Muscle weakness (generalized): Secondary | ICD-10-CM | POA: Insufficient documentation

## 2016-06-09 DIAGNOSIS — M62838 Other muscle spasm: Secondary | ICD-10-CM | POA: Insufficient documentation

## 2016-06-09 DIAGNOSIS — R2689 Other abnormalities of gait and mobility: Secondary | ICD-10-CM

## 2016-06-09 NOTE — Therapy (Signed)
[facility] [address] [place], [place], [address] Phone: [phone]   Fax:  [phone]  Physical Therapy Evaluation  Patient Details  Name: CHAQUITA BASQUES MRN: [id number] Date of Birth: Dec 26, 1960 Referring Provider: Loreli Dollar MD  Encounter Date: 06/09/2016      PT End of Session - 06/09/16 1303    Visit Number 1   Number of Visits 1   Date for PT Re-Evaluation 06/10/16   Authorization Type Medicaid 1 time evaluation   PT Start Time 1015   PT Stop Time 1101   PT Time Calculation (min) 46 min   Activity Tolerance Patient tolerated treatment well   Behavior During Therapy [facility] for tasks assessed/performed      Past Medical History:  Diagnosis Date  . Diabetes mellitus without complication (HCC)   . Hypertension     No past surgical history on file.  There were no vitals filed for this visit.       Subjective Assessment - 06/09/16 1028    Subjective pt is a 55 y.o F with CC of L lateral hip pain that started between 2-3 years ago with gradual onset with occasional referral to the L knee. Reports only pain in the hip outside, denies N/T in the hip or knee. worse with standing/ walking that seems to fluctuate depending on activity. has recieved an injection from the MD which provide minor relief for alittle bit.    Limitations Sitting;Standing;Walking;Writing   How long can you sit comfortably? couple of minutes before shifting   How long can you stand comfortably? couple of minutes before sitting down   How long can you walk comfortably? 5-10 min   Diagnostic tests x-ray   Patient Stated Goals calm pain down, help bones in the body, be able to exercise.    Currently in Pain? Yes   Pain Score 6    Pain Location Hip   Pain Orientation Left;Lateral;Medial   Pain Descriptors / Indicators Sharp;Aching;Sore   Pain Type Chronic pain   Pain Radiating Towards to the anterior and lateral L knee   Pain Onset More  than a month ago   Pain Frequency Intermittent   Aggravating Factors  standing, walking, prolonged sitting, stairs, walking up/ down inclines   Pain Relieving Factors medication, muscle rub, topical ointment,             [facility] PT Assessment - 06/09/16 1029      Assessment   Medical Diagnosis L hip pain   Referring Provider Loreli Dollar MD   Onset Date/Surgical Date --  2-3 years ago   Hand Dominance Right   Next MD Visit make on PRN   Prior Therapy yes     Precautions   Precautions None     Restrictions   Weight Bearing Restrictions No     Balance Screen   Has the patient fallen in the past 6 months No   Has the patient had a decrease in activity level because of a fear of falling?  No   Is the patient reluctant to leave their home because of a fear of falling?  No     Home Environment   Living Environment Private residence   Living Arrangements Alone   Type of Home Apartment   Home Access Level entry   Home Layout One level   Home Equipment [place] - single point     Prior Function   Level of Independence Independent;Independent with basic ADLs   Vocation  On disability     Cognition   Overall Cognitive Status Within Functional Limits for tasks assessed   Area of Impairment --     Posture/Postural Control   Posture/Postural Control Postural limitations     ROM / Strength   AROM / PROM / Strength AROM;PROM;Strength     AROM   AROM Assessment Site Hip   Right/Left Hip Left   Left Hip Extension 10  pain during motion   Left Hip Flexion 64  pain during motion   Left Hip ABduction 18  pain during movement     PROM   PROM Assessment Site Hip   Right/Left Hip Left   Left Hip Extension 15  ERP   Left Hip Flexion 91  ERP   Left Hip ABduction 25  ERP     Strength   Strength Assessment Site Hip   Right/Left Hip Left   Left Hip Flexion 3+/5  pain during testing   Left Hip Extension 3+/5  pain in thigh during testing   Left Hip ABduction 3/5  pain  during testing in lateral hip/groin   Left Hip ADduction 3+/5     Palpation   Palpation comment tightness with pain in the glute medius and maximus with referral down to the L knee during palpation,  tenderness at the greater trochanter on the  L      Ambulation/Gait   Ambulation/Gait Yes   Gait Pattern Step-through pattern;Decreased stride length;Trendelenburg;Antalgic;Trunk flexed;Decreased stance time - left;Decreased step length - left                           PT Education - 06/09/16 1300    Education provided Yes   Education Details evaluation findings, HEP with proper form and treatment rationale, anatomy of the hip muscualture and referral of the muscles, manual trigger point release techniques and [facility] handout.    Person(s) Educated Patient   Methods Explanation;Verbal cues;Handout;Demonstration   Comprehension Verbalized understanding;Verbal cues required;Returned demonstration                    Plan - 06/09/16 1303    Clinical Impression Statement Mrs. Tinnie GensMungo presents to OPP[facility] as a low complexity evaluation with CC of L hip pain with referral to the L knee. she demonstrates limited hip mobility secondary to pain and tightness. weakness in the LLE with pain during testing.  tightness upon palpation of the glute medius and with referral to the L knee. she reported decreased pain following manual trigger point release of the glute medius. provied HEP and information for [facility].    PT Frequency One time visit   PT Next Visit Plan 1 time Medicaid evaluation   PT Home Exercise Plan see HEP handout   Consulted and Agree with Plan of Care Patient      Patient will benefit from skilled therapeutic intervention in order to improve the following deficits and impairments:  Abnormal gait, Pain, Improper body mechanics, Postural dysfunction, Increased muscle spasms, Decreased range of motion, Hypomobility, Decreased strength, Decreased mobility,  Decreased endurance, Decreased activity tolerance  Visit Diagnosis: Trochanteric bursitis of left hip  Muscle weakness (generalized)  Other abnormalities of gait and mobility  Other muscle spasm     Problem List There are no active problems to display for this patient.  Lulu RidingKristoffer Cabrini Ruggieri PT, DPT, LAT, ATC  06/09/16  1:11 PM       [facility] [address]  [address] [place], [place], [address] Phone: [phone]   Fax:  [phone]  Name: Franco NonesJacqueline L Pinch MRN: [id number] Date of Birth: 06/19/1961

## 2016-10-09 ENCOUNTER — Encounter (HOSPITAL_COMMUNITY): Payer: Self-pay | Admitting: *Deleted

## 2016-10-09 ENCOUNTER — Emergency Department (HOSPITAL_COMMUNITY): Payer: Medicaid Other

## 2016-10-09 ENCOUNTER — Emergency Department (HOSPITAL_COMMUNITY)
Admission: EM | Admit: 2016-10-09 | Discharge: 2016-10-09 | Disposition: A | Discharge status: Home / Self Care | Payer: Medicaid Other | Attending: Emergency Medicine | Admitting: Emergency Medicine

## 2016-10-09 DIAGNOSIS — F17210 Nicotine dependence, cigarettes, uncomplicated: Secondary | ICD-10-CM | POA: Insufficient documentation

## 2016-10-09 DIAGNOSIS — Z7984 Long term (current) use of oral hypoglycemic drugs: Secondary | ICD-10-CM | POA: Insufficient documentation

## 2016-10-09 DIAGNOSIS — R1032 Left lower quadrant pain: Secondary | ICD-10-CM | POA: Diagnosis present

## 2016-10-09 DIAGNOSIS — E119 Type 2 diabetes mellitus without complications: Secondary | ICD-10-CM | POA: Diagnosis not present

## 2016-10-09 DIAGNOSIS — Z79899 Other long term (current) drug therapy: Secondary | ICD-10-CM | POA: Insufficient documentation

## 2016-10-09 DIAGNOSIS — I10 Essential (primary) hypertension: Secondary | ICD-10-CM | POA: Insufficient documentation

## 2016-10-09 DIAGNOSIS — R109 Unspecified abdominal pain: Secondary | ICD-10-CM

## 2016-10-09 LAB — COMPREHENSIVE METABOLIC PANEL
ALBUMIN: 3.7 g/dL (ref 3.5–5.0)
ALT: 13 U/L — AB (ref 14–54)
AST: 24 U/L (ref 15–41)
Alkaline Phosphatase: 95 U/L (ref 38–126)
Anion gap: 11 (ref 5–15)
BUN: 8 mg/dL (ref 6–20)
CHLORIDE: 100 mmol/L — AB (ref 101–111)
CO2: 23 mmol/L (ref 22–32)
CREATININE: 0.62 mg/dL (ref 0.44–1.00)
Calcium: 9.2 mg/dL (ref 8.9–10.3)
GFR calc Af Amer: >60 mL/min (ref >60)
GFR calc non Af Amer: >60 mL/min (ref >60)
GLUCOSE: 298 mg/dL — AB (ref 65–99)
POTASSIUM: 4.7 mmol/L (ref 3.5–5.1)
SODIUM: 134 mmol/L — AB (ref 135–145)
Total Bilirubin: 1.5 mg/dL — ABNORMAL HIGH (ref 0.3–1.2)
Total Protein: 6.8 g/dL (ref 6.5–8.1)

## 2016-10-09 LAB — CBC
HEMATOCRIT: 44.5 % (ref 36.0–46.0)
Hemoglobin: 15.1 g/dL — ABNORMAL HIGH (ref 12.0–15.0)
MCH: 31.8 pg (ref 26.0–34.0)
MCHC: 33.9 g/dL (ref 30.0–36.0)
MCV: 93.7 fL (ref 78.0–100.0)
Platelets: 263 10*3/uL (ref 150–400)
RBC: 4.75 MIL/uL (ref 3.87–5.11)
RDW: 12.1 % (ref 11.5–15.5)
WBC: 7 10*3/uL (ref 4.0–10.5)

## 2016-10-09 LAB — URINALYSIS, ROUTINE W REFLEX MICROSCOPIC
BILIRUBIN URINE: NEGATIVE
Glucose, UA: >=500 mg/dL — AB
Ketones, ur: NEGATIVE mg/dL
LEUKOCYTES UA: NEGATIVE
Nitrite: NEGATIVE
Protein, ur: NEGATIVE mg/dL
SPECIFIC GRAVITY, URINE: 1.017 (ref 1.005–1.030)
pH: 5 (ref 5.0–8.0)

## 2016-10-09 MED ORDER — METHOCARBAMOL 750 MG PO TABS: 750.0000 mg | ORAL_TABLET | Freq: Four times a day (QID) | ORAL | 0 refills | Status: DC

## 2016-10-09 MED ORDER — IBUPROFEN 400 MG PO TABS: 400.0000 mg | ORAL_TABLET | Freq: Four times a day (QID) | ORAL | 0 refills | Status: DC | PRN

## 2016-10-09 NOTE — ED Notes (Signed)
Pt given gingerale and graham crackers per Dr. Freida BusmanAllen.

## 2016-10-09 NOTE — ED Triage Notes (Signed)
To ED for eval of left lower abd pain and left lower back pain for the past week. No difficulty with urination or bowel movements. No vomiting. Pt states laying down makes pain worse. No injury noted.

## 2016-10-09 NOTE — ED Provider Notes (Signed)
[facility]-EMERGENCY DEPT Provider Note   CSN: [id number] Arrival date & time: 10/09/16  [id number]     History   Chief Complaint Chief Complaint  Patient presents with  . Back Pain  . Abdominal Pain    HPI Darlene Pugh is a 56 y.o. female.  56 year old female presents with one-week history of left-sided flank pain radiates to her left lower quadrant. Pain is characterized as sharp and persistent with periods of more intensity. Symptoms are worse with movement and do not radiate to her leg. No prior history of diverticular disease. Denies any vaginal bleeding or discharge. Denies any hematuria, urinary frequency, dysuria. Denies any rashes to the skin. No dark or bloody stools. No prior history of same. No associated fever or chills. No treatment use prior to arrival.      Past Medical History:  Diagnosis Date  . Diabetes mellitus without complication (HCC)   . Hypertension     There are no active problems to display for this patient.   History reviewed. No pertinent surgical history.  OB History    No data available       Home Medications    Prior to Admission medications   Medication Sig Start Date End Date Taking? Authorizing Provider  atorvastatin (LIPITOR) 20 MG tablet Take 1 tablet (20 mg total) by mouth at bedtime. 10/18/15   Joycie PeekBenjamin Cartner, PA-C  gabapentin (NEURONTIN) 100 MG capsule Take 100 mg by mouth 3 (three) times daily.    Historical Provider, MD  lisinopril (PRINIVIL,ZESTRIL) 20 MG tablet Take 1 tablet (20 mg total) by mouth daily. 10/18/15   Joycie PeekBenjamin Cartner, PA-C  meloxicam (MOBIC) 7.5 MG tablet Take 2 tablets (15 mg total) by mouth daily. 10/18/15   Joycie PeekBenjamin Cartner, PA-C  metFORMIN (GLUCOPHAGE) 500 MG tablet Take 1 tablet (500 mg total) by mouth daily with breakfast. 10/18/15   Joycie PeekBenjamin Cartner, PA-C  methocarbamol (ROBAXIN) 500 MG tablet Take 1 tablet (500 mg total) by mouth 2 (two) times daily. Patient not taking: Reported on 06/09/2016 12/01/15   Rolm GalaStevi  Barrett, PA-C  minocycline (DYNACIN) 50 MG tablet Take 50 mg by mouth 2 (two) times daily. 03/15/13   Historical Provider, MD  naproxen (NAPROSYN) 500 MG tablet Take 1 tablet (500 mg total) by mouth 2 (two) times daily. Patient not taking: Reported on 06/09/2016 10/16/13   Kathrynn Speedobyn M Hess, PA-C  naproxen (NAPROSYN) 500 MG tablet Take 1 tablet (500 mg total) by mouth 2 (two) times daily. Patient not taking: Reported on 06/09/2016 12/01/15   Rolm GalaStevi Barrett, PA-C  OVER THE COUNTER MEDICATION Take 2 tablets by mouth daily as needed (sleep and pain).    Historical Provider, MD  QUEtiapine (SEROQUEL) 25 MG tablet Take 25 mg by mouth at bedtime.    Historical Provider, MD  sitaGLIPtin (JANUVIA) 100 MG tablet Take 100 mg by mouth daily.    Historical Provider, MD  traMADol (ULTRAM) 50 MG tablet Take 1 tablet (50 mg total) by mouth every 6 (six) hours as needed for pain. Patient not taking: Reported on 06/09/2016 03/30/13   Garlon HatchetLisa M Sanders, PA-C    Family History No family history on file.  Social History Social History  Substance Use Topics  . Smoking status: Current Every Day Smoker    Types: Cigarettes  . Smokeless tobacco: Never Used  . Alcohol use No     Allergies   Patient has no known allergies.   Review of Systems Review of Systems  All other systems reviewed and are  negative.    Physical Exam Updated Vital Signs BP (!) 157/102 (BP Location: Left Arm)   Pulse 101   Temp 98.2 F (36.8 C) (Oral)   Ht 5\' 4"  (1.626 m)   SpO2 100%   Physical Exam  Constitutional: She is oriented to person, place, and time. She appears well-developed and well-nourished.  Non-toxic appearance. No distress.  HENT:  Head: Normocephalic and atraumatic.  Eyes: Conjunctivae, EOM and lids are normal. Pupils are equal, round, and reactive to light.  Neck: Normal range of motion. Neck supple. No tracheal deviation present. No thyroid mass present.  Cardiovascular: Normal rate, regular rhythm and normal heart  sounds.  Exam reveals no gallop.   No murmur heard. Pulmonary/Chest: Effort normal and breath sounds normal. No stridor. No respiratory distress. She has no decreased breath sounds. She has no wheezes. She has no rhonchi. She has no rales.  Abdominal: Soft. Normal appearance and bowel sounds are normal. She exhibits no distension. There is no tenderness. There is no rebound and no CVA tenderness.    Musculoskeletal: Normal range of motion. She exhibits no edema or tenderness.  Neurological: She is alert and oriented to person, place, and time. She has normal strength. No cranial nerve deficit or sensory deficit. GCS eye subscore is 4. GCS verbal subscore is 5. GCS motor subscore is 6.  Skin: Skin is warm and dry. No abrasion and no rash noted.  Psychiatric: She has a normal mood and affect. Her speech is normal and behavior is normal.  Nursing note and vitals reviewed.    ED Treatments / Results  Labs (all labs ordered are listed, but only abnormal results are displayed) Labs Reviewed  COMPREHENSIVE METABOLIC PANEL  CBC  URINALYSIS, ROUTINE W REFLEX MICROSCOPIC    EKG  EKG Interpretation None       Radiology No results found.  Procedures Procedures (including critical care time)  Medications Ordered in ED Medications - No data to display   Initial Impression / Assessment and Plan / ED Course  I have reviewed the triage vital signs and the nursing notes.  Pertinent labs & imaging results that were available during my care of the patient were reviewed by me and considered in my medical decision making (see chart for details).     Patient's workup here is reassuring. The musculoskeletal in etiology with muscle relaxants and return precautions given.  Final Clinical Impressions(s) / ED Diagnoses   Final diagnoses:  None    New Prescriptions New Prescriptions   No medications on file     Lorre Nick, MD 10/09/16 1232

## 2017-01-22 ENCOUNTER — Ambulatory Visit: Payer: Medicaid Other | Attending: Internal Medicine | Admitting: Physical Therapy

## 2017-01-22 ENCOUNTER — Encounter: Payer: Self-pay | Admitting: Physical Therapy

## 2017-01-22 DIAGNOSIS — R293 Abnormal posture: Secondary | ICD-10-CM | POA: Insufficient documentation

## 2017-01-22 DIAGNOSIS — M25562 Pain in left knee: Secondary | ICD-10-CM | POA: Diagnosis present

## 2017-01-22 DIAGNOSIS — M545 Low back pain: Secondary | ICD-10-CM | POA: Diagnosis not present

## 2017-01-22 DIAGNOSIS — G8929 Other chronic pain: Secondary | ICD-10-CM | POA: Diagnosis present

## 2017-01-22 DIAGNOSIS — M6281 Muscle weakness (generalized): Secondary | ICD-10-CM | POA: Insufficient documentation

## 2017-01-22 NOTE — Therapy (Signed)
[facility] [address] [place], [place], [address] Phone: [phone]   Fax:  [phone]  Physical Therapy Evaluation  Patient Details  Name: Darlene Pugh MRN: [id number] Date of Birth: [birth year] Referring Provider: Gwenyth Bender, MD  Encounter Date: 01/22/2017      PT End of Session - 01/22/17 0927    Visit Number 1   Number of Visits 1   Date for PT Re-Evaluation 01/23/17   PT Start Time 0930   PT Stop Time 1015   PT Time Calculation (min) 45 min   Activity Tolerance Patient tolerated treatment well   Behavior During Therapy [facility] for tasks assessed/performed      Past Medical History:  Diagnosis Date  . Diabetes mellitus without complication (HCC)   . Hypertension     History reviewed. No pertinent surgical history.  There were no vitals filed for this visit.       Subjective Assessment - 01/22/17 0938    Subjective pt is a 56 y.o F with CC of low back pain that started for a few years unsure of specific MOI.  pain stays in the L/ R low back that and denies N/T. pt reports fluctuating symptoms depending on what she does. L knee pain started about the same the low back did. reports most of the pain stays on the outside of the L knee with fluctuating pain and sypmtoms depending on activty.    Limitations Sitting;Standing;Lifting;House hold activities   How long can you sit comfortably? 15 min   How long can you stand comfortably? 15 min   How long can you walk comfortably? 15 min   Diagnostic tests N/A   Patient Stated Goals to decrease pain, improve mobility    Currently in Pain? Yes   Pain Score 7    Pain Location Back   Pain Orientation Left;Lower;Right  L>R   Pain Descriptors / Indicators Aching;Sore;Tightness   Pain Type Chronic pain   Pain Onset More than a month ago   Pain Frequency Constant   Aggravating Factors  prolonged sitting/ standing/ walking, lifting,    Pain Relieving Factors muscle  rub,    Multiple Pain Sites Yes   Pain Score 6   Pain Location Knee   Pain Orientation Left   Pain Descriptors / Indicators Aching;Sore   Pain Onset More than a month ago   Pain Frequency Constant   Aggravating Factors  prlonged standing/ walking   Pain Relieving Factors muscle rub.             [facility] PT Assessment - 01/22/17 0926      Assessment   Medical Diagnosis Low back pain and knee pain   Referring Provider Gwenyth Bender, MD   Onset Date/Surgical Date --  many years ago   Hand Dominance Right   Next MD Visit --  2 weeks   Prior Therapy yes     Precautions   Precautions None     Restrictions   Weight Bearing Restrictions No     Balance Screen   Has the patient fallen in the past 6 months No   Has the patient had a decrease in activity level because of a fear of falling?  No   Is the patient reluctant to leave their home because of a fear of falling?  No     Home Environment   Living Environment Private residence   Living Arrangements Alone   Type of Home Apartment  Home Access Level entry   Home Layout One level   Home Equipment Cane - single point     Prior Function   Level of Independence Independent;Independent with basic ADLs   Vocation On disability     Cognition   Overall Cognitive Status Within Functional Limits for tasks assessed     Posture/Postural Control   Posture/Postural Control Postural limitations   Postural Limitations Forward head;Rounded Shoulders     ROM / Strength   AROM / PROM / Strength AROM;Strength     AROM   AROM Assessment Site Lumbar;Knee   Right/Left Knee Left   Left Knee Extension 0   Left Knee Flexion 126   Lumbar Flexion 46   Lumbar Extension 10   Lumbar - Right Side Bend 13   Lumbar - Left Side Bend 13     Strength   Strength Assessment Site Knee   Right/Left Knee Right;Left     Palpation   SI assessment  guarded mobility of lateral patellar movement on the L   Palpation comment TTP at the lateral pole  of the patella, tightness in the vastus lateralis, L lumbar parapsinal tightness with TTP at the L SIJ     Ambulation/Gait   Gait Pattern Step-through pattern;Decreased stride length;Trendelenburg;Antalgic            Objective measurements completed on examination: See above findings.          [facility] Adult PT Treatment/Exercise - 01/22/17 0926      Lumbar Exercises: Supine   Bent Knee Raise 10 reps  with cues to for core contraction   Straight Leg Raise 10 reps  x 2 on LLE     Knee/Hip Exercises: Supine   Short Arc Quad Sets Strengthening;Left;1 set;10 reps  with ball squeeze for VMO activation                PT Education - 01/22/17 1012    Education provided Yes   Education Details evaluation findings, HEP with proper form/ rationale, anatomy of areas involved, [facility] clinic handout information   Person(s) Educated Patient   Methods Explanation;Verbal cues;Handout;Demonstration   Comprehension Verbalized understanding;Verbal cues required;Returned demonstration                     Plan - 01/22/17 1013    Clinical Impression Statement Darlene Pugh presents to OPPT with CC or chronic low back and L knee pain that has been going on for mulitple years. she demonstrates limted trunk and L knee mobility due to guarding and pain. TTP at the L lateral pole of th epatella/ vastus lateralis tightness, L lumbar paraspinals and soreness at the L PSIS. reviewed HEP which she performed well. provided [facility] clinic handout.    History and Personal Factors relevant to plan of care: significant medication hx, PMHx of HTN/ DM.    Clinical Presentation Evolving   Clinical Presentation due to: chronic fluctuating pain, abnormal posture, limited trunk mobility, spasm in L lumbar spine. weakness / guarding.    Clinical Decision Making Moderate   PT Next Visit Plan 1 x    PT Home Exercise Plan see pt instruction   Consulted and Agree with Plan of Care Patient       Patient will benefit from skilled therapeutic intervention in order to improve the following deficits and impairments:  Abnormal gait, Pain, Improper body mechanics, Postural dysfunction, Decreased mobility, Decreased strength, Increased fascial restricitons, Decreased endurance, Decreased activity tolerance, Decreased balance  Visit Diagnosis:  Chronic bilateral low back pain, with sciatica presence unspecified - Plan: PT plan of care cert/re-cert  Chronic pain of left knee - Plan: PT plan of care cert/re-cert  Muscle weakness (generalized) - Plan: PT plan of care cert/re-cert  Abnormal posture - Plan: PT plan of care cert/re-cert     Problem List There are no active problems to display for this patient.  Lulu Riding PT, DPT, LAT, ATC  01/22/17  10:22 AM      [facility] [address] [place], [place], [address] Phone: [phone]   Fax:  [phone]  Name: Darlene Pugh MRN: [id number] Date of Birth: July 28, 1961

## 2017-07-31 ENCOUNTER — Emergency Department (HOSPITAL_COMMUNITY): Payer: Medicaid Other

## 2017-07-31 ENCOUNTER — Encounter (HOSPITAL_COMMUNITY): Payer: Self-pay | Admitting: Emergency Medicine

## 2017-07-31 ENCOUNTER — Other Ambulatory Visit: Payer: Self-pay

## 2017-07-31 ENCOUNTER — Inpatient Hospital Stay (HOSPITAL_COMMUNITY)
Admission: EM | Admit: 2017-07-31 | Discharge: 2017-08-03 | DRG: 247 | Disposition: A | Discharge status: Home / Self Care | Payer: Medicaid Other | Attending: Cardiology | Admitting: Cardiology

## 2017-07-31 DIAGNOSIS — I2510 Atherosclerotic heart disease of native coronary artery without angina pectoris: Secondary | ICD-10-CM | POA: Diagnosis present

## 2017-07-31 DIAGNOSIS — E1151 Type 2 diabetes mellitus with diabetic peripheral angiopathy without gangrene: Secondary | ICD-10-CM | POA: Diagnosis present

## 2017-07-31 DIAGNOSIS — R0789 Other chest pain: Secondary | ICD-10-CM | POA: Diagnosis present

## 2017-07-31 DIAGNOSIS — I10 Essential (primary) hypertension: Secondary | ICD-10-CM | POA: Diagnosis present

## 2017-07-31 DIAGNOSIS — Z791 Long term (current) use of non-steroidal anti-inflammatories (NSAID): Secondary | ICD-10-CM | POA: Diagnosis not present

## 2017-07-31 DIAGNOSIS — F17210 Nicotine dependence, cigarettes, uncomplicated: Secondary | ICD-10-CM | POA: Diagnosis present

## 2017-07-31 DIAGNOSIS — Z6832 Body mass index (BMI) 32.0-32.9, adult: Secondary | ICD-10-CM

## 2017-07-31 DIAGNOSIS — I214 Non-ST elevation (NSTEMI) myocardial infarction: Principal | ICD-10-CM | POA: Diagnosis present

## 2017-07-31 DIAGNOSIS — E785 Hyperlipidemia, unspecified: Secondary | ICD-10-CM | POA: Diagnosis present

## 2017-07-31 LAB — BASIC METABOLIC PANEL
ANION GAP: 7 (ref 5–15)
BUN: 10 mg/dL (ref 6–20)
CALCIUM: 9.6 mg/dL (ref 8.9–10.3)
CO2: 27 mmol/L (ref 22–32)
CREATININE: 0.72 mg/dL (ref 0.44–1.00)
Chloride: 103 mmol/L (ref 101–111)
Glucose, Bld: 174 mg/dL — ABNORMAL HIGH (ref 65–99)
Potassium: 4.2 mmol/L (ref 3.5–5.1)
Sodium: 137 mmol/L (ref 135–145)

## 2017-07-31 LAB — GLUCOSE, CAPILLARY: Glucose-Capillary: 188 mg/dL — ABNORMAL HIGH (ref 65–99)

## 2017-07-31 LAB — HEPARIN LEVEL (UNFRACTIONATED): Heparin Unfractionated: 0.16 IU/mL — ABNORMAL LOW (ref 0.30–0.70)

## 2017-07-31 LAB — HEPATIC FUNCTION PANEL
ALT: 13 U/L — ABNORMAL LOW (ref 14–54)
AST: 37 U/L (ref 15–41)
Albumin: 3.6 g/dL (ref 3.5–5.0)
Alkaline Phosphatase: 82 U/L (ref 38–126)
BILIRUBIN DIRECT: 0.2 mg/dL (ref 0.1–0.5)
BILIRUBIN INDIRECT: 1.5 mg/dL — AB (ref 0.3–0.9)
Total Bilirubin: 1.7 mg/dL — ABNORMAL HIGH (ref 0.3–1.2)
Total Protein: 6.8 g/dL (ref 6.5–8.1)

## 2017-07-31 LAB — CBC
HCT: 44.9 % (ref 36.0–46.0)
HEMOGLOBIN: 14.8 g/dL (ref 12.0–15.0)
MCH: 31.7 pg (ref 26.0–34.0)
MCHC: 33 g/dL (ref 30.0–36.0)
MCV: 96.1 fL (ref 78.0–100.0)
PLATELETS: 262 10*3/uL (ref 150–400)
RBC: 4.67 MIL/uL (ref 3.87–5.11)
RDW: 13.2 % (ref 11.5–15.5)
WBC: 11.1 10*3/uL — ABNORMAL HIGH (ref 4.0–10.5)

## 2017-07-31 LAB — I-STAT BETA HCG BLOOD, ED (MC, WL, AP ONLY)

## 2017-07-31 LAB — HEMOGLOBIN A1C
HEMOGLOBIN A1C: 7.8 % — AB (ref 4.8–5.6)
MEAN PLASMA GLUCOSE: 177.16 mg/dL

## 2017-07-31 LAB — LIPASE, BLOOD: LIPASE: 24 U/L (ref 11–51)

## 2017-07-31 LAB — TROPONIN I
TROPONIN I: 6.96 ng/mL — AB (ref <0.03)
TROPONIN I: 6.17 ng/mL — AB (ref <0.03)
Troponin I: 5.92 ng/mL (ref <0.03)

## 2017-07-31 LAB — I-STAT TROPONIN, ED: TROPONIN I, POC: 6.17 ng/mL — AB (ref 0.00–0.08)

## 2017-07-31 LAB — MRSA PCR SCREENING: MRSA by PCR: NEGATIVE

## 2017-07-31 LAB — PROTIME-INR
INR: 1.18
PROTHROMBIN TIME: 14.9 s (ref 11.4–15.2)

## 2017-07-31 MED ORDER — METOPROLOL TARTRATE 25 MG PO TABS
25.0000 mg | ORAL_TABLET | Freq: Two times a day (BID) | ORAL | Status: DC
  Administered 2017-07-31 – 2017-08-03 (×6): 25 mg via ORAL
  Filled 2017-07-31 (×7): qty 1

## 2017-07-31 MED ORDER — ASPIRIN 81 MG PO CHEW
324.0000 mg | CHEWABLE_TABLET | Freq: Once | ORAL | Status: DC
  Filled 2017-07-31: qty 4

## 2017-07-31 MED ORDER — HEPARIN BOLUS VIA INFUSION
4000.0000 [IU] | Freq: Once | INTRAVENOUS | Status: AC
  Administered 2017-07-31: 4000 [IU] via INTRAVENOUS
  Filled 2017-07-31: qty 4000

## 2017-07-31 MED ORDER — HEPARIN (PORCINE) IN NACL 100-0.45 UNIT/ML-% IJ SOLN
1000.0000 [IU]/h | INTRAMUSCULAR | Status: DC
  Administered 2017-07-31: 900 [IU]/h via INTRAVENOUS
  Filled 2017-07-31 (×2): qty 250

## 2017-07-31 MED ORDER — ONDANSETRON HCL 4 MG/2ML IJ SOLN: 4.0000 mg | Freq: Four times a day (QID) | INTRAMUSCULAR | Status: DC | PRN

## 2017-07-31 MED ORDER — AMLODIPINE BESYLATE 5 MG PO TABS
2.5000 mg | ORAL_TABLET | Freq: Every day | ORAL | Status: DC
  Administered 2017-07-31 – 2017-08-02 (×3): 2.5 mg via ORAL
  Filled 2017-07-31 (×3): qty 1

## 2017-07-31 MED ORDER — SODIUM CHLORIDE 0.9 % IV SOLN
INTRAVENOUS | Status: DC
  Administered 2017-07-31: 19:00:00 via INTRAVENOUS

## 2017-07-31 MED ORDER — NITROGLYCERIN 0.4 MG SL SUBL: 0.4000 mg | SUBLINGUAL_TABLET | SUBLINGUAL | Status: DC | PRN

## 2017-07-31 MED ORDER — MORPHINE SULFATE (PF) 4 MG/ML IV SOLN
4.0000 mg | Freq: Once | INTRAVENOUS | Status: AC
  Administered 2017-07-31: 4 mg via INTRAVENOUS
  Filled 2017-07-31: qty 1

## 2017-07-31 MED ORDER — ASPIRIN EC 81 MG PO TBEC
81.0000 mg | DELAYED_RELEASE_TABLET | Freq: Every day | ORAL | Status: DC
  Administered 2017-08-01 – 2017-08-03 (×2): 81 mg via ORAL
  Filled 2017-07-31 (×2): qty 1

## 2017-07-31 MED ORDER — ASPIRIN 81 MG PO CHEW
324.0000 mg | CHEWABLE_TABLET | ORAL | Status: AC
  Administered 2017-07-31: 324 mg via ORAL

## 2017-07-31 MED ORDER — CLOPIDOGREL BISULFATE 300 MG PO TABS
300.0000 mg | ORAL_TABLET | Freq: Once | ORAL | Status: AC
  Administered 2017-07-31: 300 mg via ORAL
  Filled 2017-07-31: qty 1

## 2017-07-31 MED ORDER — ATORVASTATIN CALCIUM 80 MG PO TABS
80.0000 mg | ORAL_TABLET | Freq: Every day | ORAL | Status: DC
  Administered 2017-07-31 – 2017-08-02 (×3): 80 mg via ORAL
  Filled 2017-07-31 (×4): qty 1

## 2017-07-31 MED ORDER — NITROGLYCERIN IN D5W 200-5 MCG/ML-% IV SOLN
5.0000 ug/min | INTRAVENOUS | Status: DC
  Administered 2017-07-31 – 2017-08-02 (×2): 5 ug/min via INTRAVENOUS
  Filled 2017-07-31: qty 250

## 2017-07-31 MED ORDER — IOPAMIDOL (ISOVUE-370) INJECTION 76%
INTRAVENOUS | Status: AC
Start: 2017-07-31 — End: 2017-07-31
  Administered 2017-07-31: 100 mL
  Filled 2017-07-31: qty 100

## 2017-07-31 MED ORDER — INSULIN ASPART 100 UNIT/ML ~~LOC~~ SOLN: 0.0000 [IU] | Freq: Three times a day (TID) | SUBCUTANEOUS | Status: DC

## 2017-07-31 MED ORDER — ACETAMINOPHEN 325 MG PO TABS: 650.0000 mg | ORAL_TABLET | ORAL | Status: DC | PRN

## 2017-07-31 MED ORDER — PANTOPRAZOLE SODIUM 40 MG PO TBEC
40.0000 mg | DELAYED_RELEASE_TABLET | Freq: Two times a day (BID) | ORAL | Status: DC
  Administered 2017-07-31 – 2017-08-03 (×6): 40 mg via ORAL
  Filled 2017-07-31 (×6): qty 1

## 2017-07-31 MED ORDER — ASPIRIN 300 MG RE SUPP: 300.0000 mg | RECTAL | Status: AC

## 2017-07-31 NOTE — ED Triage Notes (Signed)
Pt. Stated, Darlene Pugh had stomach pain and then I started having some chest pain. Ive never had that

## 2017-07-31 NOTE — ED Provider Notes (Signed)
[facility] Provider Note   CSN: [id number] Arrival date & time: 07/31/17  1205     History   Chief Complaint Chief Complaint  Patient presents with  . Abdominal Pain  . Chest Pain    HPI Darlene Pugh is a 56 y.o. female.  The history is provided by the patient. No language interpreter was used.  Abdominal Pain    Chest Pain   Associated symptoms include abdominal pain.    Darlene Pugh is a 56 y.o. female who presents to the Emergency Department complaining of chest pain/abdominal pain.  Presents for evaluation of severe left-sided chest pain that started yesterday.  Pain is described as sharp in nature and radiating to her left back as well as her left lower abdomen.  Overall her pain is improved since it began.  She did have a headache along with the pain.  No fevers, diaphoresis, shortness of breath, nausea, vomiting.  She does have a history of hypertension and diabetes.  Past Medical History:  Diagnosis Date  . Diabetes mellitus without complication (HCC)   . Hypertension     There are no active problems to display for this patient.   History reviewed. No pertinent surgical history.  OB History    No data available       Home Medications    Prior to Admission medications   Medication Sig Start Date End Date Taking? Authorizing Provider  gabapentin (NEURONTIN) 100 MG capsule Take 100 mg by mouth at bedtime.    Yes [provider]  lisinopril (PRINIVIL,ZESTRIL) 20 MG tablet Take 1 tablet (20 mg total) by mouth daily. 10/18/15  Yes Cartner, Sharlet SalinaBenjamin, PA-C  meloxicam (MOBIC) 7.5 MG tablet Take 2 tablets (15 mg total) by mouth daily. 10/18/15  Yes Cartner, Sharlet SalinaBenjamin, PA-C  QUEtiapine (SEROQUEL) 25 MG tablet Take 25 mg by mouth at bedtime.   Yes [provider]  Vitamin D, Ergocalciferol, (DRISDOL) 50000 units CAPS capsule Take 50,000 Units by mouth every 7 (seven) days.   Yes [provider]  zolpidem (AMBIEN) 5 MG tablet Take 5 mg by mouth at bedtime.   Yes [provider]  atorvastatin (LIPITOR) 20 MG tablet Take 1 tablet (20 mg total) by mouth at bedtime. Patient not taking: Reported on 07/31/2017 10/18/15   Joycie Peekartner, Benjamin, PA-C  ibuprofen (ADVIL,MOTRIN) 400 MG tablet Take 1 tablet (400 mg total) by mouth every 6 (six) hours as needed. Patient not taking: Reported on 01/22/2017 10/09/16   Lorre NickAllen, Anthony, MD  metFORMIN (GLUCOPHAGE) 500 MG tablet Take 1 tablet (500 mg total) by mouth daily with breakfast. Patient not taking: Reported on 07/31/2017 10/18/15   Joycie Peekartner, Benjamin, PA-C  methocarbamol (ROBAXIN) 500 MG tablet Take 1 tablet (500 mg total) by mouth 2 (two) times daily. Patient not taking: Reported on 01/22/2017 12/01/15   Barrett, Rolm GalaStevi, PA-C  methocarbamol (ROBAXIN-750) 750 MG tablet Take 1 tablet (750 mg total) by mouth 4 (four) times daily. Patient not taking: Reported on 01/22/2017 10/09/16   Lorre NickAllen, Anthony, MD  naproxen (NAPROSYN) 500 MG tablet Take 1 tablet (500 mg total) by mouth 2 (two) times daily. Patient not taking: Reported on 01/22/2017 10/16/13   Kathrynn SpeedHess, Robyn M, PA-C  naproxen (NAPROSYN) 500 MG tablet Take 1 tablet (500 mg total) by mouth 2 (two) times daily. Patient not taking: Reported on 07/31/2017 12/01/15   Barrett, Rolm GalaStevi, PA-C  traMADol (ULTRAM) 50 MG tablet Take 1 tablet (50 mg total) by mouth every  6 (six) hours as needed for pain. Patient not taking: Reported on 01/22/2017 03/30/13   Garlon HatchetSanders, Lisa M, PA-C    Family History No family history on file.  Social History Social History   Tobacco Use  . Smoking status: Current Every Day Smoker    Types: Cigarettes  . Smokeless tobacco: Never Used  Substance Use Topics  . Alcohol use: No  . Drug use: No     Allergies   Patient has no known allergies.   Review of Systems Review of Systems  Cardiovascular: Positive for chest pain.  Gastrointestinal: Positive for abdominal pain.  All other  systems reviewed and are negative.    Physical Exam Updated Vital Signs BP 116/77 (BP Location: Right Arm)   Pulse 77   Temp 98.4 F (36.9 C) (Oral)   Resp 18   Ht 5\' 4"  (1.626 m)   SpO2 99%   BMI 32.96 kg/m   Physical Exam  Constitutional: She is oriented to person, place, and time. She appears well-developed and well-nourished. No distress.  HENT:  Head: Normocephalic and atraumatic.  Cardiovascular: Normal rate and regular rhythm.  No murmur heard. Pulmonary/Chest: Effort normal. No respiratory distress.  Occasional rhonchi  Abdominal: Soft. There is no rebound and no guarding.  Moderate diffuse abdominal tenderness  Musculoskeletal: She exhibits no edema or tenderness.  Neurological: She is alert and oriented to person, place, and time.  Skin: Skin is warm and dry.  Psychiatric: She has a normal mood and affect. Her behavior is normal.  Nursing note and vitals reviewed.    ED Treatments / Results  Labs (all labs ordered are listed, but only abnormal results are displayed) Labs Reviewed  BASIC METABOLIC PANEL - Abnormal; Notable for the following components:      Result Value   Glucose, Bld 174 (*)    All other components within normal limits  CBC - Abnormal; Notable for the following components:   WBC 11.1 (*)    All other components within normal limits  TROPONIN I - Abnormal; Notable for the following components:   Troponin I 5.92 (*)    All other components within normal limits  HEPATIC FUNCTION PANEL - Abnormal; Notable for the following components:   ALT 13 (*)    Total Bilirubin 1.7 (*)    Indirect Bilirubin 1.5 (*)    All other components within normal limits  I-STAT TROPONIN, ED - Abnormal; Notable for the following components:   Troponin i, poc 6.17 (*)    All other components within normal limits  LIPASE, BLOOD  I-STAT BETA HCG BLOOD, ED ([facility], [facility], [facility] ONLY)    EKG  EKG Interpretation  Date/Time:  Saturday July 31 2017 12:32:38  EST Ventricular Rate:  88 PR Interval:  142 QRS Duration: 72 QT Interval:  388 QTC Calculation: 469 R Axis:   54 Text Interpretation:  Normal sinus rhythm Normal ECG Confirmed by Tilden Fossaees, Zamar Odwyer [phone]([id number]) on 07/31/2017 1:54:41 PM       Radiology Dg Chest 2 View  Result Date: 07/31/2017 CLINICAL DATA:  56 year old with left chest pain. Left shoulder pain. EXAM: CHEST  2 VIEW COMPARISON:  05/26/2016 FINDINGS: Few small densities at the left lung base are suggestive for atelectasis. Otherwise, the lungs are clear. No pulmonary edema. Heart and mediastinum are within normal limits. Trachea is midline. Surgical clips in the right upper quadrant. Degenerative endplate changes in thoracic spine. No large pleural effusions. IMPRESSION: Small left basilar densities, most compatible with atelectasis. Electronically Signed  By: Richarda Overlie M.D.   On: 07/31/2017 13:36   Ct Angio Chest/abd/pel For Dissection W And/or W/wo  Result Date: 07/31/2017 CLINICAL DATA:  56 year old female with central chest pain radiating into the left lower abdomen and back for the past 2 days EXAM: CT ANGIOGRAPHY CHEST, ABDOMEN AND PELVIS TECHNIQUE: Multidetector CT imaging through the chest, abdomen and pelvis was performed using the standard protocol during bolus administration of intravenous contrast. Multiplanar reconstructed images and MIPs were obtained and reviewed to evaluate the vascular anatomy. CONTRAST:  ISOVUE-370 IOPAMIDOL (ISOVUE-370) INJECTION 76% COMPARISON:  Prior CT scan of the abdomen and pelvis 10/09/2016 FINDINGS: CTA CHEST FINDINGS Cardiovascular: The initial unenhanced images demonstrate no evidence of increased density within the aortic media to suggest an acute intramural hematoma. Following administration of intravenous contrast, there is excellent opacification of the thoracic aorta and its branch vessels. Four vessel aortic arch. The left vertebral artery arises directly from the aorta. The aorta  is normal in caliber. No evidence of dissection. No significant atherosclerotic plaque. The pulmonary arteries are also adequately opacified to the proximal segmental level. No evidence of pulmonary embolus. The heart is normal in size. No pericardial effusion. Trace calcification along the left anterior descending coronary artery. Mediastinum/Nodes: Unremarkable CT appearance of the thyroid gland. No suspicious mediastinal or hilar adenopathy. No soft tissue mediastinal mass. The thoracic esophagus is unremarkable. Lungs/Pleura: Mild dependent atelectasis in both lower lobes. No focal airspace consolidation, pulmonary edema, pleural effusion or pneumothorax. No suspicious pulmonary nodule or mass. 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 No evidence of acute fracture or aggressive appearing lytic or blastic osseous lesion. Multilevel degenerative disc disease throughout the thoracic spine. Review of the MIP images confirms the above findings. CTA ABDOMEN AND PELVIS FINDINGS VASCULAR Aorta: Normal caliber abdominal aorta. Trace atherosclerotic vascular calcifications. No evidence of dissection or ulcerated plaque. Celiac: The lateral segmental branch of the left hepatic artery is replaced to the left gastric artery. Otherwise, no evidence of significant stenosis, dissection or aneurysm. SMA: Patent without evidence of aneurysm, dissection, vasculitis or significant stenosis. Renals: Both renal arteries are patent without evidence of aneurysm, dissection, vasculitis, fibromuscular dysplasia or significant stenosis. IMA: Patent without evidence of aneurysm, dissection, vasculitis or significant stenosis. Inflow: Scattered atherosclerotic vascular calcifications. No focal stenosis, dissection or aneurysm. Outflow: Critical stenosis of the origin of the right superficial femoral artery. 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 Veins: No obvious venous  abnormality within the limitations of this arterial phase study. Review of the MIP images confirms the above findings. NON-VASCULAR Hepatobiliary: Small transient hepatic attenuation difference versus flash fill capillary hemangioma in hepatic segment 7. Otherwise, no focal liver lesion. The gallbladder is surgically absent. No intra or extrahepatic biliary ductal dilatation. Pancreas: Unremarkable. No pancreatic ductal dilatation or surrounding inflammatory changes. Spleen: Normal in size without focal abnormality. Adrenals/Urinary Tract: Adrenal glands are unremarkable. Kidneys are normal, without renal calculi, focal lesion, or hydronephrosis. Bladder is unremarkable. Stomach/Bowel: No evidence of obstruction or focal bowel wall thickening. Normal appendix in the right lower quadrant. The terminal ileum is unremarkable. Lymphatic: No suspicious lymphadenopathy. Reproductive: Uterus and bilateral adnexa are unremarkable. Other: No abdominal wall hernia or abnormality. No abdominopelvic ascites. Musculoskeletal: No acute fracture or aggressive appearing lytic or blastic osseous lesion. Focal degenerative disc disease at L5-S1. Bilateral lower lumbar facet arthropathy. Review of the MIP images confirms the above findings. IMPRESSION: CTA Chest 1. No evidence of acute aortic abnormality or pulmonary embolus. 2. Coronary  artery calcifications along the course of the left anterior descending coronary artery. Please note that although the presence of coronary artery calcium documents the presence of coronary artery disease, the severity of this disease and any potential stenosis cannot be assessed on this non-gated CT examination. Assessment for potential risk factor modification, dietary therapy or pharmacologic therapy may be warranted, if clinically indicated. 3. 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 CTA Abd/Pelvis 1. No evidence of acute vascular abnormality, or other acute abnormality within  the abdomen or pelvis. 2. 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 3. Critical stenosis of the origin of the right superficial femoral artery. Does the patient have clinical symptoms of claudication in either lower extremity? 4. Focal lower lumbar degenerative disc disease and facet arthropathy. 5. Surgical changes of prior cholecystectomy. Signed, Sterling Big, MD [facility] Radiology Electronically Signed   By: Malachy Moan M.D.   On: 07/31/2017 16:15    Procedures Procedures (including critical care time) CRITICAL CARE Performed by: Tilden Fossa   Total critical care time: 35 minutes  Critical care time was exclusive of separately billable procedures and treating other patients.  Critical care was necessary to treat or prevent imminent or life-threatening deterioration.  Critical care was time spent personally by me on the following activities: development of treatment plan with patient and/or surrogate as well as nursing, discussions with consultants, evaluation of patient's response to treatment, examination of patient, obtaining history from patient or surrogate, ordering and performing treatments and interventions, ordering and review of laboratory studies, ordering and review of radiographic studies, pulse oximetry and re-evaluation of patient's condition.  Medications Ordered in ED Medications  aspirin chewable tablet 324 mg (not administered)  morphine 4 MG/ML injection 4 mg (4 mg Intravenous Given 07/31/17 1511)  iopamidol (ISOVUE-370) 76 % injection (100 mLs  Contrast Given 07/31/17 1540)     Initial Impression / Assessment and Plan / ED Course  I have reviewed the triage vital signs and the nursing notes.  Pertinent labs & imaging results that were available during my care of the patient were reviewed by me and considered in my medical decision making (see chart for details).      Patient here for evaluation of left-sided chest pain rating to the back and abdomen.  Her pain is essentially resolved on evaluation initial concern for possible dissection based on her history anticoagulations were held up front.  Initial EKG with no acute ischemic changes.  CTA obtained that was negative for dissection.  She was her on aspirin heparin drip for non-ST elevation MI.  Cardiology consulted for further management.  Final Clinical Impressions(s) / ED Diagnoses   Final diagnoses:  NSTEMI (non-ST elevated myocardial infarction) [facility])    ED Discharge Orders    None       Tilden Fossa, MD 07/31/17 1644

## 2017-07-31 NOTE — ED Notes (Signed)
I Stat Lactic Acid results shown to Dr. Particia NearingHaviland, and Charge Nurse [facility]

## 2017-07-31 NOTE — Progress Notes (Signed)
ANTICOAGULATION CONSULT NOTE - Initial Consult  Pharmacy Consult for heparin Indication: chest pain/ACS  No Known Allergies  Patient Measurements: Height: 5\' 4"  (162.6 cm) IBW/kg (Calculated) : 54.7 Heparin Dosing Weight: 72.2kg  Vital Signs: Temp: 98.4 F (36.9 C) (12/22 1226) Temp Source: Oral (12/22 1226) BP: 116/77 (12/22 1226) Pulse Rate: 77 (12/22 1226)  Labs: Recent Labs    07/31/17 1302 07/31/17 1500  HGB 14.8  --   HCT 44.9  --   PLT 262  --   CREATININE 0.72  --   TROPONINI  --  5.92*    CrCl cannot be calculated (Unknown ideal weight.).   Medical History: Past Medical History:  Diagnosis Date  . Diabetes mellitus without complication (HCC)   . Hypertension    Assessment: [age] yof presented to the ED with abdominal pain and chest pain. Troponin is elevated at 5.92. To start IV heparin. Baseline CBC is WNL and she it not on anticoagulation PTA.   Goal of Therapy:  Heparin level 0.3-0.7 units/ml Monitor platelets by anticoagulation protocol: Yes   Plan:  Heparin bolus 4000 units IV x 1 Heparin gtt 900 units/hr Check a 6 hr heparin level Daily heparin level and CBC  Zoey Gilkeson, Drake Leachachel Lynn 07/31/2017,4:38 PM

## 2017-07-31 NOTE — Progress Notes (Signed)
ANTICOAGULATION CONSULT NOTE  Pharmacy Consult for Heparin Indication: chest pain/ACS  No Known Allergies  Patient Measurements: Height: 5\' 4"  (162.6 cm) Weight: 177 lb 4 oz (80.4 kg) IBW/kg (Calculated) : 54.7 Heparin Dosing Weight: 72.2kg  Vital Signs: Temp: 98.2 F (36.8 C) (12/22 2324) Temp Source: Oral (12/22 2324) BP: 93/67 (12/22 2324) Pulse Rate: 65 (12/22 2324)  Labs: Recent Labs    07/31/17 1302 07/31/17 1500 07/31/17 1756 07/31/17 2312  HGB 14.8  --   --   --   HCT 44.9  --   --   --   PLT 262  --   --   --   LABPROT  --   --  14.9  --   INR  --   --  1.18  --   HEPARINUNFRC  --   --   --  0.16*  CREATININE 0.72  --   --   --   TROPONINI  --  5.92* 6.96*  --     Estimated Creatinine Clearance: 81.5 mL/min (by C-G formula based on SCr of 0.72 mg/dL).   Medical History: Past Medical History:  Diagnosis Date  . Diabetes mellitus without complication (HCC)   . Hypertension    Assessment: [age] yof presented to the ED with abdominal pain and chest pain. Troponin is elevated. To start IV heparin. Baseline CBC is WNL and she it not on anticoagulation PTA.   12/22 PM: heparin level sub-therapeutic   Goal of Therapy:  Heparin level 0.3-0.7 units/ml Monitor platelets by anticoagulation protocol: Yes   Plan:  Inc heparin to 1050 units/hr 0800 HL  Darlene Pugh 07/31/2017,11:53 PM

## 2017-07-31 NOTE — H&P (Signed)
Darlene Pugh is an 56 y.o. female.   Chief Complaint: Chest pain/abdominal pain since yesterday HPI: Patient is 56 year old female with past medical history significant for hypertension, diabetes mellitus, morbid obesity, tobacco abuse, came to ER complaining of left-sided chest pain/abdominal pain radiating to left shoulder and upper back associated with nausea and did not seek any medical attention states chest pain lasted 2-3 hours and went to bed this morning again felt similar pain so decided to come to ED. Patient denies any shortness of breath. Denies palpitation lightheadedness or syncope. EKG done in the ED showed normal sinus rhythm with minimal ST coving in inferior leads with minimal respiratory: Changes patient was noted to have elevated troponin I. Patient presently denies any chest pain at present. CT of the chest done in the ED showed no evidence of pulmonary embolism or dissection. States has not been on any antiplatelet medications.  Past Medical History:  Diagnosis Date  . Diabetes mellitus without complication ([place])   . Hypertension     History reviewed. No pertinent surgical history.  No family history on file. Social History:  reports that she has been smoking cigarettes.  she has never used smokeless tobacco. She reports that she does not drink alcohol or use drugs.  Allergies: No Known Allergies   (Not in a hospital admission)  Results for orders placed or performed during the hospital encounter of 07/31/17 (from the past 48 hour(s))  Basic metabolic panel     Status: Abnormal   Collection Time: 07/31/17  1:02 PM  Result Value Ref Range   Sodium 137 135 - 145 mmol/L   Potassium 4.2 3.5 - 5.1 mmol/L   Chloride 103 101 - 111 mmol/L   CO2 27 22 - 32 mmol/L   Glucose, Bld 174 (H) 65 - 99 mg/dL   BUN 10 6 - 20 mg/dL   Creatinine, Ser 0.72 0.44 - 1.00 mg/dL   Calcium 9.6 8.9 - 10.3 mg/dL   GFR calc non Af Amer >60 >60 mL/min   GFR calc Af Amer >60 >60 mL/min     Comment: (NOTE) The eGFR has been calculated using the CKD EPI equation. This calculation has not been validated in all clinical situations. eGFR's persistently <60 mL/min signify possible Chronic Kidney Disease.    Anion gap 7 5 - 15  CBC     Status: Abnormal   Collection Time: 07/31/17  1:02 PM  Result Value Ref Range   WBC 11.1 (H) 4.0 - 10.5 K/uL   RBC 4.67 3.87 - 5.11 MIL/uL   Hemoglobin 14.8 12.0 - 15.0 g/dL   HCT 44.9 36.0 - 46.0 %   MCV 96.1 78.0 - 100.0 fL   MCH 31.7 26.0 - 34.0 pg   MCHC 33.0 30.0 - 36.0 g/dL   RDW 13.2 11.5 - 15.5 %   Platelets 262 150 - 400 K/uL  I-Stat beta hCG blood, ED     Status: None   Collection Time: 07/31/17  1:14 PM  Result Value Ref Range   I-stat hCG, quantitative <5.0 <5 mIU/mL   Comment 3            Comment:   GEST. AGE      CONC.  (mIU/mL)   <=1 WEEK        5 - 50     2 WEEKS       50 - 500     3 WEEKS       100 - 10,000  4 WEEKS     1,000 - 30,000        FEMALE AND NON-PREGNANT FEMALE:     LESS THAN 5 mIU/mL   I-stat troponin, ED     Status: Abnormal   Collection Time: 07/31/17  1:36 PM  Result Value Ref Range   Troponin i, poc 6.17 (HH) 0.00 - 0.08 ng/mL   Comment NOTIFIED PHYSICIAN    Comment 3            Comment: Due to the release kinetics of cTnI, a negative result within the first hours of the onset of symptoms does not rule out myocardial infarction with certainty. If myocardial infarction is still suspected, repeat the test at appropriate intervals.   Troponin I     Status: Abnormal   Collection Time: 07/31/17  3:00 PM  Result Value Ref Range   Troponin I 5.92 (HH) <0.03 ng/mL    Comment: CRITICAL RESULT CALLED TO, READ BACK BY AND VERIFIED WITH: KAREN COBB RN AT 1638 07/31/17 BY WOOLLENK   Lipase, blood     Status: None   Collection Time: 07/31/17  3:00 PM  Result Value Ref Range   Lipase 24 11 - 51 U/L  Hepatic function panel     Status: Abnormal   Collection Time: 07/31/17  3:00 PM  Result Value Ref  Range   Total Protein 6.8 6.5 - 8.1 g/dL   Albumin 3.6 3.5 - 5.0 g/dL   AST 37 15 - 41 U/L   ALT 13 (L) 14 - 54 U/L   Alkaline Phosphatase 82 38 - 126 U/L   Total Bilirubin 1.7 (H) 0.3 - 1.2 mg/dL   Bilirubin, Direct 0.2 0.1 - 0.5 mg/dL   Indirect Bilirubin 1.5 (H) 0.3 - 0.9 mg/dL   Dg Chest 2 View  Result Date: 07/31/2017 CLINICAL DATA:  56 year old with left chest pain. Left shoulder pain. EXAM: CHEST  2 VIEW COMPARISON:  05/26/2016 FINDINGS: Few small densities at the left lung base are suggestive for atelectasis. Otherwise, the lungs are clear. No pulmonary edema. Heart and mediastinum are within normal limits. Trachea is midline. Surgical clips in the right upper quadrant. Degenerative endplate changes in thoracic spine. No large pleural effusions. IMPRESSION: Small left basilar densities, most compatible with atelectasis. Electronically Signed   By: Markus Daft M.D.   On: 07/31/2017 13:36   Ct Angio Chest/abd/pel For Dissection W And/or W/wo  Result Date: 07/31/2017 CLINICAL DATA:  56 year old female with central chest pain radiating into the left lower abdomen and back for the past 2 days EXAM: CT ANGIOGRAPHY CHEST, ABDOMEN AND PELVIS TECHNIQUE: Multidetector CT imaging through the chest, abdomen and pelvis was performed using the standard protocol during bolus administration of intravenous contrast. Multiplanar reconstructed images and MIPs were obtained and reviewed to evaluate the vascular anatomy. CONTRAST:  164m ISOVUE-370 IOPAMIDOL (ISOVUE-370) INJECTION 76% COMPARISON:  Prior CT scan of the abdomen and pelvis 10/09/2016 FINDINGS: CTA CHEST FINDINGS Cardiovascular: The initial unenhanced images demonstrate no evidence of increased density within the aortic media to suggest an acute intramural hematoma. Following administration of intravenous contrast, there is excellent opacification of the thoracic aorta and its branch vessels. Four vessel aortic arch. The left vertebral artery arises  directly from the aorta. The aorta is normal in caliber. No evidence of dissection. No significant atherosclerotic plaque. The pulmonary arteries are also adequately opacified to the proximal segmental level. No evidence of pulmonary embolus. The heart is normal in size. No pericardial effusion. Trace calcification  along the left anterior descending coronary artery. Mediastinum/Nodes: Unremarkable CT appearance of the thyroid gland. No suspicious mediastinal or hilar adenopathy. No soft tissue mediastinal mass. The thoracic esophagus is unremarkable. Lungs/Pleura: Mild dependent atelectasis in both lower lobes. No focal airspace consolidation, pulmonary edema, pleural effusion or pneumothorax. No suspicious pulmonary nodule or mass. 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 No evidence of acute fracture or aggressive appearing lytic or blastic osseous lesion. Multilevel degenerative disc disease throughout the thoracic spine. Review of the MIP images confirms the above findings. CTA ABDOMEN AND PELVIS FINDINGS VASCULAR Aorta: Normal caliber abdominal aorta. Trace atherosclerotic vascular calcifications. No evidence of dissection or ulcerated plaque. Celiac: The lateral segmental branch of the left hepatic artery is replaced to the left gastric artery. Otherwise, no evidence of significant stenosis, dissection or aneurysm. SMA: Patent without evidence of aneurysm, dissection, vasculitis or significant stenosis. Renals: Both renal arteries are patent without evidence of aneurysm, dissection, vasculitis, fibromuscular dysplasia or significant stenosis. IMA: Patent without evidence of aneurysm, dissection, vasculitis or significant stenosis. Inflow: Scattered atherosclerotic vascular calcifications. No focal stenosis, dissection or aneurysm. Outflow: Critical stenosis of the origin of the right superficial femoral artery. 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  Veins: No obvious venous abnormality within the limitations of this arterial phase study. Review of the MIP images confirms the above findings. NON-VASCULAR Hepatobiliary: Small transient hepatic attenuation difference versus flash fill capillary hemangioma in hepatic segment 7. Otherwise, no focal liver lesion. The gallbladder is surgically absent. No intra or extrahepatic biliary ductal dilatation. Pancreas: Unremarkable. No pancreatic ductal dilatation or surrounding inflammatory changes. Spleen: Normal in size without focal abnormality. Adrenals/Urinary Tract: Adrenal glands are unremarkable. Kidneys are normal, without renal calculi, focal lesion, or hydronephrosis. Bladder is unremarkable. Stomach/Bowel: No evidence of obstruction or focal bowel wall thickening. Normal appendix in the right lower quadrant. The terminal ileum is unremarkable. Lymphatic: No suspicious lymphadenopathy. Reproductive: Uterus and bilateral adnexa are unremarkable. Other: No abdominal wall hernia or abnormality. No abdominopelvic ascites. Musculoskeletal: No acute fracture or aggressive appearing lytic or blastic osseous lesion. Focal degenerative disc disease at L5-S1. Bilateral lower lumbar facet arthropathy. Review of the MIP images confirms the above findings. IMPRESSION: CTA Chest 1. No evidence of acute aortic abnormality or pulmonary embolus. 2. Coronary artery calcifications along the course of the left anterior descending coronary artery. Please note that although the presence of coronary artery calcium documents the presence of coronary artery disease, the severity of this disease and any potential stenosis cannot be assessed on this non-gated CT examination. Assessment for potential risk factor modification, dietary therapy or pharmacologic therapy may be warranted, if clinically indicated. 3. 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 CTA Abd/Pelvis 1. No evidence of acute vascular abnormality, or other  acute abnormality within the abdomen or pelvis. 2. 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 3. Critical stenosis of the origin of the right superficial femoral artery. Does the patient have clinical symptoms of claudication in either lower extremity? 4. Focal lower lumbar degenerative disc disease and facet arthropathy. 5. Surgical changes of prior cholecystectomy. Signed, Criselda Peaches, MD [facility] Electronically Signed   By: Jacqulynn Cadet M.D.   On: 07/31/2017 16:15    Review of Systems  Constitutional: Negative for chills and fever.  Eyes: Negative for blurred vision.  Respiratory: Negative for shortness of breath.   Cardiovascular: Positive for chest pain. Negative for orthopnea and claudication.  Gastrointestinal:  Positive for nausea. Negative for abdominal pain and vomiting.  Genitourinary: Negative for dysuria.  Skin: Negative for rash.  Neurological: Negative for dizziness.    Blood pressure 116/77, pulse 77, temperature 98.4 F (36.9 C), temperature source Oral, resp. rate 18, height _0  (1.626 m), weight 81.3 kg (179 lb 3.2 oz), SpO2 99 %. Physical Exam  Constitutional: She is oriented to person, place, and time.  HENT:  Head: Normocephalic and atraumatic.  Eyes: Conjunctivae are normal. Pupils are equal, round, and reactive to light.  Neck: Normal range of motion. Neck supple.  Cardiovascular: Normal rate and regular rhythm.  Murmur (Soft systolic murmur noted no S3 gallop) heard. Respiratory: Effort normal and breath sounds normal. No respiratory distress. She has no wheezes. She has no rales.  GI: Soft. Bowel sounds are normal. She exhibits no distension. There is no tenderness. There is no rebound and no guarding.  Musculoskeletal: She exhibits no edema, tenderness or deformity.  Neurological: She is alert and oriented to person, place, and time.     Assessment/Plan Acute  non-Q-wave myocardial infarction probably involving RCA hypertension Diabetes mellitus Morbid obesity Tobacco abuse Plan As per orders Patient presently is pain-free discussed with patient at length regarding left cardiac catheterization possible PTCA stenting its risk and benefits i.e. death MI stroke need for emergency CABG local vascular complications etc. and consents for PCI will schedule her for Monday unless she has recurrent chest pain EKG changes or markedly elevated cardiac enzymes or signs of hemodynamic compromise. Patient agrees  Charolette Forward, MD 07/31/2017, 4:55 PM

## 2017-08-01 ENCOUNTER — Other Ambulatory Visit: Payer: Self-pay

## 2017-08-01 LAB — GLUCOSE, CAPILLARY
GLUCOSE-CAPILLARY: 255 mg/dL — AB (ref 65–99)
GLUCOSE-CAPILLARY: 217 mg/dL — AB (ref 65–99)
Glucose-Capillary: 161 mg/dL — ABNORMAL HIGH (ref 65–99)
Glucose-Capillary: 106 mg/dL — ABNORMAL HIGH (ref 65–99)

## 2017-08-01 LAB — BASIC METABOLIC PANEL
Anion gap: 7 (ref 5–15)
BUN: 12 mg/dL (ref 6–20)
CO2: 24 mmol/L (ref 22–32)
Calcium: 9.2 mg/dL (ref 8.9–10.3)
Chloride: 104 mmol/L (ref 101–111)
Creatinine, Ser: 0.75 mg/dL (ref 0.44–1.00)
GFR calc Af Amer: >60 mL/min (ref >60)
GFR calc non Af Amer: >60 mL/min (ref >60)
Glucose, Bld: 151 mg/dL — ABNORMAL HIGH (ref 65–99)
Potassium: 3.9 mmol/L (ref 3.5–5.1)
Sodium: 135 mmol/L (ref 135–145)

## 2017-08-01 LAB — LIPID PANEL
Cholesterol: 274 mg/dL — ABNORMAL HIGH (ref 0–200)
HDL: 37 mg/dL — ABNORMAL LOW (ref >40)
LDL Cholesterol: UNDETERMINED mg/dL (ref 0–99)
Total CHOL/HDL Ratio: 7.4 RATIO
Triglycerides: 535 mg/dL — ABNORMAL HIGH (ref <150)
VLDL: UNDETERMINED mg/dL (ref 0–40)

## 2017-08-01 LAB — HIV ANTIBODY (ROUTINE TESTING W REFLEX): HIV Screen 4th Generation wRfx: NONREACTIVE

## 2017-08-01 LAB — TROPONIN I: Troponin I: 5.52 ng/mL (ref <0.03)

## 2017-08-01 LAB — CBC
HCT: 40.6 % (ref 36.0–46.0)
Hemoglobin: 13.3 g/dL (ref 12.0–15.0)
MCH: 31.4 pg (ref 26.0–34.0)
MCHC: 32.8 g/dL (ref 30.0–36.0)
MCV: 95.8 fL (ref 78.0–100.0)
Platelets: 268 10*3/uL (ref 150–400)
RBC: 4.24 MIL/uL (ref 3.87–5.11)
RDW: 13.3 % (ref 11.5–15.5)
WBC: 9.1 10*3/uL (ref 4.0–10.5)

## 2017-08-01 LAB — HEPARIN LEVEL (UNFRACTIONATED): Heparin Unfractionated: 0.27 IU/mL — ABNORMAL LOW (ref 0.30–0.70)

## 2017-08-01 MED ORDER — NICOTINE 21 MG/24HR TD PT24
21.0000 mg | MEDICATED_PATCH | Freq: Once | TRANSDERMAL | Status: AC
  Administered 2017-08-01: 21 mg via TRANSDERMAL
  Filled 2017-08-01: qty 1

## 2017-08-01 MED ORDER — LOSARTAN POTASSIUM 50 MG PO TABS
25.0000 mg | ORAL_TABLET | Freq: Every day | ORAL | Status: DC
  Administered 2017-08-01 – 2017-08-03 (×3): 25 mg via ORAL
  Filled 2017-08-01 (×3): qty 1

## 2017-08-01 MED ORDER — GLIMEPIRIDE 4 MG PO TABS
2.0000 mg | ORAL_TABLET | Freq: Every day | ORAL | Status: DC
  Administered 2017-08-01 – 2017-08-03 (×2): 2 mg via ORAL
  Filled 2017-08-01 (×2): qty 1

## 2017-08-01 MED ORDER — CLOPIDOGREL BISULFATE 75 MG PO TABS
75.0000 mg | ORAL_TABLET | Freq: Every day | ORAL | Status: DC
  Administered 2017-08-01 – 2017-08-02 (×2): 75 mg via ORAL
  Filled 2017-08-01 (×2): qty 1

## 2017-08-01 NOTE — Progress Notes (Signed)
Subjective:  Doing well denies any chest pain or shortness of breath. Cardiac enzymes trending down.  Objective:  Vital Signs in the last 24 hours: Temp:  [97.8 F (36.6 C)-98.4 F (36.9 C)] 97.8 F (36.6 C) (12/23 0810) Pulse Rate:  [60-78] 73 (12/23 0821) Resp:  [15-26] 17 (12/23 0810) BP: (91-140)/(67-89) 140/80 (12/23 0810) SpO2:  [95 %-100 %] 100 % (12/23 0810) Weight:  [80.4 kg (177 lb 4 oz)-81.3 kg (179 lb 3.2 oz)] 80.4 kg (177 lb 4 oz) (12/22 1836)  Intake/Output from previous day: 12/22 0701 - 12/23 0700 In: 859.3 [P.O.:370; I.V.:489.3] Out: 1 [Urine:1] Intake/Output from this shift: No intake/output data recorded.  Physical Exam: Neck: no adenopathy, no carotid bruit, no JVD and supple, symmetrical, trachea midline Lungs: clear to auscultation bilaterally Heart: regular rate and rhythm, S1, S2 normal and Soft systolic murmur noted Abdomen: soft, non-tender; bowel sounds normal; no masses,  no organomegaly Extremities: extremities normal, atraumatic, no cyanosis or edema  Lab Results: Recent Labs    07/31/17 1302 08/01/17 0255  WBC 11.1* 9.1  HGB 14.8 13.3  PLT 262 268   Recent Labs    07/31/17 1302 08/01/17 0255  NA 137 135  K 4.2 3.9  CL 103 104  CO2 27 24  GLUCOSE 174* 151*  BUN 10 12  CREATININE 0.72 0.75   Recent Labs    07/31/17 2312 08/01/17 0255  TROPONINI 6.17* 5.52*   Hepatic Function Panel Recent Labs    07/31/17 1500  PROT 6.8  ALBUMIN 3.6  AST 37  ALT 13*  ALKPHOS 82  BILITOT 1.7*  BILIDIR 0.2  IBILI 1.5*   Recent Labs    08/01/17 0255  CHOL 274*   No results for input(s): PROTIME in the last 72 hours.  Imaging: Imaging results have been reviewed and Dg Chest 2 View  Result Date: 07/31/2017 CLINICAL DATA:  56 year old with left chest pain. Left shoulder pain. EXAM: CHEST  2 VIEW COMPARISON:  05/26/2016 FINDINGS: Few small densities at the left lung base are suggestive for atelectasis. Otherwise, the lungs are clear.  No pulmonary edema. Heart and mediastinum are within normal limits. Trachea is midline. Surgical clips in the right upper quadrant. Degenerative endplate changes in thoracic spine. No large pleural effusions. IMPRESSION: Small left basilar densities, most compatible with atelectasis. Electronically Signed   By: Richarda OverlieAdam  Henn M.D.   On: 07/31/2017 13:36   Ct Angio Chest/abd/pel For Dissection W And/or W/wo  Result Date: 07/31/2017 CLINICAL DATA:  56 year old female with central chest pain radiating into the left lower abdomen and back for the past 2 days EXAM: CT ANGIOGRAPHY CHEST, ABDOMEN AND PELVIS TECHNIQUE: Multidetector CT imaging through the chest, abdomen and pelvis was performed using the standard protocol during bolus administration of intravenous contrast. Multiplanar reconstructed images and MIPs were obtained and reviewed to evaluate the vascular anatomy. CONTRAST:  100mL ISOVUE-370 IOPAMIDOL (ISOVUE-370) INJECTION 76% COMPARISON:  Prior CT scan of the abdomen and pelvis 10/09/2016 FINDINGS: CTA CHEST FINDINGS Cardiovascular: The initial unenhanced images demonstrate no evidence of increased density within the aortic media to suggest an acute intramural hematoma. Following administration of intravenous contrast, there is excellent opacification of the thoracic aorta and its branch vessels. Four vessel aortic arch. The left vertebral artery arises directly from the aorta. The aorta is normal in caliber. No evidence of dissection. No significant atherosclerotic plaque. The pulmonary arteries are also adequately opacified to the proximal segmental level. No evidence of pulmonary embolus. The heart is normal  in size. No pericardial effusion. Trace calcification along the left anterior descending coronary artery. Mediastinum/Nodes: Unremarkable CT appearance of the thyroid gland. No suspicious mediastinal or hilar adenopathy. No soft tissue mediastinal mass. The thoracic esophagus is unremarkable.  Lungs/Pleura: Mild dependent atelectasis in both lower lobes. No focal airspace consolidation, pulmonary edema, pleural effusion or pneumothorax. No suspicious pulmonary nodule or mass. 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 No evidence of acute fracture or aggressive appearing lytic or blastic osseous lesion. Multilevel degenerative disc disease throughout the thoracic spine. Review of the MIP images confirms the above findings. CTA ABDOMEN AND PELVIS FINDINGS VASCULAR Aorta: Normal caliber abdominal aorta. Trace atherosclerotic vascular calcifications. No evidence of dissection or ulcerated plaque. Celiac: The lateral segmental branch of the left hepatic artery is replaced to the left gastric artery. Otherwise, no evidence of significant stenosis, dissection or aneurysm. SMA: Patent without evidence of aneurysm, dissection, vasculitis or significant stenosis. Renals: Both renal arteries are patent without evidence of aneurysm, dissection, vasculitis, fibromuscular dysplasia or significant stenosis. IMA: Patent without evidence of aneurysm, dissection, vasculitis or significant stenosis. Inflow: Scattered atherosclerotic vascular calcifications. No focal stenosis, dissection or aneurysm. Outflow: Critical stenosis of the origin of the right superficial femoral artery. 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 Veins: No obvious venous abnormality within the limitations of this arterial phase study. Review of the MIP images confirms the above findings. NON-VASCULAR Hepatobiliary: Small transient hepatic attenuation difference versus flash fill capillary hemangioma in hepatic segment 7. Otherwise, no focal liver lesion. The gallbladder is surgically absent. No intra or extrahepatic biliary ductal dilatation. Pancreas: Unremarkable. No pancreatic ductal dilatation or surrounding inflammatory changes. Spleen: Normal in size without focal abnormality.  Adrenals/Urinary Tract: Adrenal glands are unremarkable. Kidneys are normal, without renal calculi, focal lesion, or hydronephrosis. Bladder is unremarkable. Stomach/Bowel: No evidence of obstruction or focal bowel wall thickening. Normal appendix in the right lower quadrant. The terminal ileum is unremarkable. Lymphatic: No suspicious lymphadenopathy. Reproductive: Uterus and bilateral adnexa are unremarkable. Other: No abdominal wall hernia or abnormality. No abdominopelvic ascites. Musculoskeletal: No acute fracture or aggressive appearing lytic or blastic osseous lesion. Focal degenerative disc disease at L5-S1. Bilateral lower lumbar facet arthropathy. Review of the MIP images confirms the above findings. IMPRESSION: CTA Chest 1. No evidence of acute aortic abnormality or pulmonary embolus. 2. Coronary artery calcifications along the course of the left anterior descending coronary artery. Please note that although the presence of coronary artery calcium documents the presence of coronary artery disease, the severity of this disease and any potential stenosis cannot be assessed on this non-gated CT examination. Assessment for potential risk factor modification, dietary therapy or pharmacologic therapy may be warranted, if clinically indicated. 3. 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 CTA Abd/Pelvis 1. No evidence of acute vascular abnormality, or other acute abnormality within the abdomen or pelvis. 2. 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 3. Critical stenosis of the origin of the right superficial femoral artery. Does the patient have clinical symptoms of claudication in either lower extremity? 4. Focal lower lumbar degenerative disc disease and facet arthropathy. 5. Surgical changes of prior cholecystectomy. Signed, Sterling Big, MD [facility] Electronically Signed   By: Malachy Moan M.D.    On: 07/31/2017 16:15    Cardiac Studies:  Assessment/Plan:  Acute non-Q-wave myocardial infarction probably involving RCA Hypertension Diabetes mellitus Morbid obesity Tobacco abuse Hyperlipidemia Peripheral vascular disease Plan Discussed with patient at length  regarding left cardiac catheterization possible PTCA stenting its risk and benefits i.e. death MI stroke need for emergency CABG local vascular complications etc. and consents for PCI   LOS: 1 day    Rinaldo CloudHarwani, Kveon Casanas 08/01/2017, 10:51 AM

## 2017-08-01 NOTE — Progress Notes (Signed)
ANTICOAGULATION CONSULT NOTE  Pharmacy Consult for Heparin Indication: chest pain/ACS  No Known Allergies  Patient Measurements: Height: 5\' 4"  (162.6 cm) Weight: 177 lb 4 oz (80.4 kg) IBW/kg (Calculated) : 54.7 Heparin Dosing Weight: 72.2kg  Vital Signs: Temp: 97.8 F (36.6 C) (12/23 0810) Temp Source: Oral (12/23 0810) BP: 140/80 (12/23 0810) Pulse Rate: 73 (12/23 0821)  Labs: Recent Labs    07/31/17 1302  07/31/17 1756 07/31/17 2312 08/01/17 0255  HGB 14.8  --   --   --  13.3  HCT 44.9  --   --   --  40.6  PLT 262  --   --   --  268  LABPROT  --   --  14.9  --   --   INR  --   --  1.18  --   --   HEPARINUNFRC  --   --   --  0.16*  --   CREATININE 0.72  --   --   --  0.75  TROPONINI  --    < > 6.96* 6.17* 5.52*   < > = values in this interval not displayed.    Estimated Creatinine Clearance: 81.5 mL/min (by C-G formula based on SCr of 0.75 mg/dL).   Medical History: Past Medical History:  Diagnosis Date  . Diabetes mellitus without complication ([facility])   . Hypertension    Assessment: [age] yof presented to the ED with abdominal pain and chest pain. Troponin is elevated. To start IV heparin. Baseline CBC is WNL and she it not on anticoagulation PTA.   Heparin level this morning is still slightly below goal at 0.25. No issues noted overnight. Will adjust and follow up with am labs.  Goal of Therapy:  Heparin level 0.3-0.7 units/ml Monitor platelets by anticoagulation protocol: Yes   Plan:  Increase heparin to 1200 units/hr Daily CBC and heparin level  Sheppard CoilFrank Wilson PharmD., BCPS Clinical Pharmacist 08/01/2017 8:22 AM

## 2017-08-01 NOTE — H&P (View-Only) (Signed)
Subjective:  Doing well denies any chest pain or shortness of breath. Cardiac enzymes trending down.  Objective:  Vital Signs in the last 24 hours: Temp:  [97.8 F (36.6 C)-98.4 F (36.9 C)] 97.8 F (36.6 C) (12/23 0810) Pulse Rate:  [60-78] 73 (12/23 0821) Resp:  [15-26] 17 (12/23 0810) BP: (91-140)/(67-89) 140/80 (12/23 0810) SpO2:  [95 %-100 %] 100 % (12/23 0810) Weight:  [80.4 kg (177 lb 4 oz)-81.3 kg (179 lb 3.2 oz)] 80.4 kg (177 lb 4 oz) (12/22 1836)  Intake/Output from previous day: 12/22 0701 - 12/23 0700 In: 859.3 [P.O.:370; I.V.:489.3] Out: 1 [Urine:1] Intake/Output from this shift: No intake/output data recorded.  Physical Exam: Neck: no adenopathy, no carotid bruit, no JVD and supple, symmetrical, trachea midline Lungs: clear to auscultation bilaterally Heart: regular rate and rhythm, S1, S2 normal and Soft systolic murmur noted Abdomen: soft, non-tender; bowel sounds normal; no masses,  no organomegaly Extremities: extremities normal, atraumatic, no cyanosis or edema  Lab Results: Recent Labs    07/31/17 1302 08/01/17 0255  WBC 11.1* 9.1  HGB 14.8 13.3  PLT 262 268   Recent Labs    07/31/17 1302 08/01/17 0255  NA 137 135  K 4.2 3.9  CL 103 104  CO2 27 24  GLUCOSE 174* 151*  BUN 10 12  CREATININE 0.72 0.75   Recent Labs    07/31/17 2312 08/01/17 0255  TROPONINI 6.17* 5.52*   Hepatic Function Panel Recent Labs    07/31/17 1500  PROT 6.8  ALBUMIN 3.6  AST 37  ALT 13*  ALKPHOS 82  BILITOT 1.7*  BILIDIR 0.2  IBILI 1.5*   Recent Labs    08/01/17 0255  CHOL 274*   No results for input(s): PROTIME in the last 72 hours.  Imaging: Imaging results have been reviewed and Dg Chest 2 View  Result Date: 07/31/2017 CLINICAL DATA:  56 year old with left chest pain. Left shoulder pain. EXAM: CHEST  2 VIEW COMPARISON:  05/26/2016 FINDINGS: Few small densities at the left lung base are suggestive for atelectasis. Otherwise, the lungs are clear.  No pulmonary edema. Heart and mediastinum are within normal limits. Trachea is midline. Surgical clips in the right upper quadrant. Degenerative endplate changes in thoracic spine. No large pleural effusions. IMPRESSION: Small left basilar densities, most compatible with atelectasis. Electronically Signed   By: Richarda OverlieAdam  Henn M.D.   On: 07/31/2017 13:36   Ct Angio Chest/abd/pel For Dissection W And/or W/wo  Result Date: 07/31/2017 CLINICAL DATA:  56 year old female with central chest pain radiating into the left lower abdomen and back for the past 2 days EXAM: CT ANGIOGRAPHY CHEST, ABDOMEN AND PELVIS TECHNIQUE: Multidetector CT imaging through the chest, abdomen and pelvis was performed using the standard protocol during bolus administration of intravenous contrast. Multiplanar reconstructed images and MIPs were obtained and reviewed to evaluate the vascular anatomy. CONTRAST:  100mL ISOVUE-370 IOPAMIDOL (ISOVUE-370) INJECTION 76% COMPARISON:  Prior CT scan of the abdomen and pelvis 10/09/2016 FINDINGS: CTA CHEST FINDINGS Cardiovascular: The initial unenhanced images demonstrate no evidence of increased density within the aortic media to suggest an acute intramural hematoma. Following administration of intravenous contrast, there is excellent opacification of the thoracic aorta and its branch vessels. Four vessel aortic arch. The left vertebral artery arises directly from the aorta. The aorta is normal in caliber. No evidence of dissection. No significant atherosclerotic plaque. The pulmonary arteries are also adequately opacified to the proximal segmental level. No evidence of pulmonary embolus. The heart is normal  in size. No pericardial effusion. Trace calcification along the left anterior descending coronary artery. Mediastinum/Nodes: Unremarkable CT appearance of the thyroid gland. No suspicious mediastinal or hilar adenopathy. No soft tissue mediastinal mass. The thoracic esophagus is unremarkable.  Lungs/Pleura: Mild dependent atelectasis in both lower lobes. No focal airspace consolidation, pulmonary edema, pleural effusion or pneumothorax. No suspicious pulmonary nodule or mass. 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 No evidence of acute fracture or aggressive appearing lytic or blastic osseous lesion. Multilevel degenerative disc disease throughout the thoracic spine. Review of the MIP images confirms the above findings. CTA ABDOMEN AND PELVIS FINDINGS VASCULAR Aorta: Normal caliber abdominal aorta. Trace atherosclerotic vascular calcifications. No evidence of dissection or ulcerated plaque. Celiac: The lateral segmental branch of the left hepatic artery is replaced to the left gastric artery. Otherwise, no evidence of significant stenosis, dissection or aneurysm. SMA: Patent without evidence of aneurysm, dissection, vasculitis or significant stenosis. Renals: Both renal arteries are patent without evidence of aneurysm, dissection, vasculitis, fibromuscular dysplasia or significant stenosis. IMA: Patent without evidence of aneurysm, dissection, vasculitis or significant stenosis. Inflow: Scattered atherosclerotic vascular calcifications. No focal stenosis, dissection or aneurysm. Outflow: Critical stenosis of the origin of the right superficial femoral artery. 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 Veins: No obvious venous abnormality within the limitations of this arterial phase study. Review of the MIP images confirms the above findings. NON-VASCULAR Hepatobiliary: Small transient hepatic attenuation difference versus flash fill capillary hemangioma in hepatic segment 7. Otherwise, no focal liver lesion. The gallbladder is surgically absent. No intra or extrahepatic biliary ductal dilatation. Pancreas: Unremarkable. No pancreatic ductal dilatation or surrounding inflammatory changes. Spleen: Normal in size without focal abnormality.  Adrenals/Urinary Tract: Adrenal glands are unremarkable. Kidneys are normal, without renal calculi, focal lesion, or hydronephrosis. Bladder is unremarkable. Stomach/Bowel: No evidence of obstruction or focal bowel wall thickening. Normal appendix in the right lower quadrant. The terminal ileum is unremarkable. Lymphatic: No suspicious lymphadenopathy. Reproductive: Uterus and bilateral adnexa are unremarkable. Other: No abdominal wall hernia or abnormality. No abdominopelvic ascites. Musculoskeletal: No acute fracture or aggressive appearing lytic or blastic osseous lesion. Focal degenerative disc disease at L5-S1. Bilateral lower lumbar facet arthropathy. Review of the MIP images confirms the above findings. IMPRESSION: CTA Chest 1. No evidence of acute aortic abnormality or pulmonary embolus. 2. Coronary artery calcifications along the course of the left anterior descending coronary artery. Please note that although the presence of coronary artery calcium documents the presence of coronary artery disease, the severity of this disease and any potential stenosis cannot be assessed on this non-gated CT examination. Assessment for potential risk factor modification, dietary therapy or pharmacologic therapy may be warranted, if clinically indicated. 3. 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 CTA Abd/Pelvis 1. No evidence of acute vascular abnormality, or other acute abnormality within the abdomen or pelvis. 2. 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 3. Critical stenosis of the origin of the right superficial femoral artery. Does the patient have clinical symptoms of claudication in either lower extremity? 4. Focal lower lumbar degenerative disc disease and facet arthropathy. 5. Surgical changes of prior cholecystectomy. Signed, Sterling Big, MD [facility] Electronically Signed   By: Malachy Moan M.D.    On: 07/31/2017 16:15    Cardiac Studies:  Assessment/Plan:  Acute non-Q-wave myocardial infarction probably involving RCA Hypertension Diabetes mellitus Morbid obesity Tobacco abuse Hyperlipidemia Peripheral vascular disease Plan Discussed with patient at length  regarding left cardiac catheterization possible PTCA stenting its risk and benefits i.e. death MI stroke need for emergency CABG local vascular complications etc. and consents for PCI   LOS: 1 day    Rinaldo CloudHarwani, Eryka Dolinger 08/01/2017, 10:51 AM

## 2017-08-02 ENCOUNTER — Encounter (HOSPITAL_COMMUNITY)
Admission: EM | Disposition: A | Discharge status: Home / Self Care | Payer: Self-pay | Source: Home / Self Care | Attending: Cardiology

## 2017-08-02 HISTORY — PX: LEFT HEART CATH AND CORONARY ANGIOGRAPHY: CATH118249

## 2017-08-02 LAB — GLUCOSE, CAPILLARY
Glucose-Capillary: 133 mg/dL — ABNORMAL HIGH (ref 65–99)
Glucose-Capillary: 91 mg/dL (ref 65–99)
Glucose-Capillary: 110 mg/dL — ABNORMAL HIGH (ref 65–99)
Glucose-Capillary: 111 mg/dL — ABNORMAL HIGH (ref 65–99)

## 2017-08-02 LAB — CBC
HCT: 40.4 % (ref 36.0–46.0)
Hemoglobin: 13.1 g/dL (ref 12.0–15.0)
MCH: 31 pg (ref 26.0–34.0)
MCHC: 32.4 g/dL (ref 30.0–36.0)
MCV: 95.5 fL (ref 78.0–100.0)
PLATELETS: 268 10*3/uL (ref 150–400)
RBC: 4.23 MIL/uL (ref 3.87–5.11)
RDW: 13 % (ref 11.5–15.5)
WBC: 9.7 10*3/uL (ref 4.0–10.5)

## 2017-08-02 LAB — HEPARIN LEVEL (UNFRACTIONATED)
HEPARIN UNFRACTIONATED: 0.88 [IU]/mL — AB (ref 0.30–0.70)
HEPARIN UNFRACTIONATED: 0.71 [IU]/mL — AB (ref 0.30–0.70)

## 2017-08-02 LAB — POCT ACTIVATED CLOTTING TIME: Activated Clotting Time: 362 seconds

## 2017-08-02 SURGERY — LEFT HEART CATH AND CORONARY ANGIOGRAPHY
Anesthesia: LOCAL

## 2017-08-02 MED ORDER — MORPHINE SULFATE (PF) 4 MG/ML IV SOLN
2.0000 mg | INTRAVENOUS | Status: DC | PRN
  Administered 2017-08-02 (×2): 2 mg via INTRAVENOUS
  Filled 2017-08-02 (×2): qty 1

## 2017-08-02 MED ORDER — HEPARIN (PORCINE) IN NACL 2-0.9 UNIT/ML-% IJ SOLN
INTRAMUSCULAR | Status: AC
  Filled 2017-08-02: qty 1000

## 2017-08-02 MED ORDER — SODIUM CHLORIDE 0.9 % IV SOLN
INTRAVENOUS | Status: AC
  Administered 2017-08-02: 1000 mL via INTRAVENOUS
  Administered 2017-08-02: 14:00:00 via INTRAVENOUS

## 2017-08-02 MED ORDER — BIVALIRUDIN TRIFLUOROACETATE 250 MG IV SOLR
INTRAVENOUS | Status: AC
  Filled 2017-08-02: qty 250

## 2017-08-02 MED ORDER — MORPHINE SULFATE (PF) 4 MG/ML IV SOLN
2.0000 mg | Freq: Once | INTRAVENOUS | Status: AC
  Administered 2017-08-02: 19:00:00 2 mg via INTRAVENOUS
  Filled 2017-08-02: qty 1

## 2017-08-02 MED ORDER — SODIUM CHLORIDE 0.9 % IV SOLN: 250.0000 mL | INTRAVENOUS | Status: DC | PRN

## 2017-08-02 MED ORDER — IOPAMIDOL (ISOVUE-370) INJECTION 76%
INTRAVENOUS | Status: DC | PRN
  Administered 2017-08-02: 135 mL via INTRAVENOUS

## 2017-08-02 MED ORDER — TICAGRELOR 90 MG PO TABS
ORAL_TABLET | ORAL | Status: AC
  Filled 2017-08-02: qty 2

## 2017-08-02 MED ORDER — LIDOCAINE HCL (PF) 1 % IJ SOLN
INTRAMUSCULAR | Status: DC | PRN
  Administered 2017-08-02: 20 mL

## 2017-08-02 MED ORDER — SODIUM CHLORIDE 0.9% FLUSH: 3.0000 mL | Freq: Two times a day (BID) | INTRAVENOUS | Status: DC

## 2017-08-02 MED ORDER — SODIUM CHLORIDE 0.9% FLUSH: 3.0000 mL | INTRAVENOUS | Status: DC | PRN

## 2017-08-02 MED ORDER — NITROGLYCERIN IN D5W 200-5 MCG/ML-% IV SOLN
INTRAVENOUS | Status: AC
  Filled 2017-08-02: qty 250

## 2017-08-02 MED ORDER — SODIUM CHLORIDE 0.9 % WEIGHT BASED INFUSION
1.0000 mL/kg/h | INTRAVENOUS | Status: DC
  Administered 2017-08-02: 1 mL/kg/h via INTRAVENOUS

## 2017-08-02 MED ORDER — TICAGRELOR 90 MG PO TABS
90.0000 mg | ORAL_TABLET | Freq: Two times a day (BID) | ORAL | Status: DC
  Administered 2017-08-02 – 2017-08-03 (×2): 90 mg via ORAL
  Filled 2017-08-02 (×4): qty 1

## 2017-08-02 MED ORDER — IOPAMIDOL (ISOVUE-370) INJECTION 76%
INTRAVENOUS | Status: AC
  Filled 2017-08-02: qty 125

## 2017-08-02 MED ORDER — FENTANYL CITRATE (PF) 100 MCG/2ML IJ SOLN
INTRAMUSCULAR | Status: DC | PRN
  Administered 2017-08-02 (×2): 25 ug via INTRAVENOUS

## 2017-08-02 MED ORDER — ASPIRIN 81 MG PO CHEW
81.0000 mg | CHEWABLE_TABLET | ORAL | Status: AC
  Administered 2017-08-02: 81 mg via ORAL
  Filled 2017-08-02: qty 1

## 2017-08-02 MED ORDER — SODIUM CHLORIDE 0.9% FLUSH
3.0000 mL | Freq: Two times a day (BID) | INTRAVENOUS | Status: DC
  Administered 2017-08-02: 3 mL via INTRAVENOUS

## 2017-08-02 MED ORDER — MIDAZOLAM HCL 2 MG/2ML IJ SOLN
INTRAMUSCULAR | Status: AC
  Filled 2017-08-02: qty 2

## 2017-08-02 MED ORDER — TICAGRELOR 90 MG PO TABS
ORAL_TABLET | ORAL | Status: DC | PRN
  Administered 2017-08-02: 180 mg via ORAL

## 2017-08-02 MED ORDER — IOPAMIDOL (ISOVUE-370) INJECTION 76%
INTRAVENOUS | Status: AC
  Filled 2017-08-02: qty 50

## 2017-08-02 MED ORDER — SODIUM CHLORIDE 0.9 % IV SOLN
INTRAVENOUS | Status: AC | PRN
  Administered 2017-08-02: 250 mL via INTRAVENOUS

## 2017-08-02 MED ORDER — LIDOCAINE HCL (PF) 1 % IJ SOLN
INTRAMUSCULAR | Status: AC
  Filled 2017-08-02: qty 30

## 2017-08-02 MED ORDER — MIDAZOLAM HCL 2 MG/2ML IJ SOLN
INTRAMUSCULAR | Status: DC | PRN
  Administered 2017-08-02 (×2): 1 mg via INTRAVENOUS

## 2017-08-02 MED ORDER — HEPARIN (PORCINE) IN NACL 2-0.9 UNIT/ML-% IJ SOLN
INTRAMUSCULAR | Status: AC | PRN
  Administered 2017-08-02: 1000 mL via INTRA_ARTERIAL

## 2017-08-02 MED ORDER — NITROGLYCERIN 1 MG/10 ML FOR IR/CATH LAB
INTRA_ARTERIAL | Status: AC
  Filled 2017-08-02: qty 10

## 2017-08-02 MED ORDER — SODIUM CHLORIDE 0.9 % IV SOLN
INTRAVENOUS | Status: AC | PRN
  Administered 2017-08-02: 1.75 mg/kg/h via INTRAVENOUS

## 2017-08-02 MED ORDER — BIVALIRUDIN BOLUS VIA INFUSION - CUPID
INTRAVENOUS | Status: DC | PRN
  Administered 2017-08-02: 61.2 mg via INTRAVENOUS

## 2017-08-02 MED ORDER — ACETAMINOPHEN 325 MG PO TABS: 650.0000 mg | ORAL_TABLET | ORAL | Status: DC | PRN

## 2017-08-02 MED ORDER — HYDRALAZINE HCL 20 MG/ML IJ SOLN: 10.0000 mg | INTRAMUSCULAR | Status: DC | PRN

## 2017-08-02 MED ORDER — FENTANYL CITRATE (PF) 100 MCG/2ML IJ SOLN
INTRAMUSCULAR | Status: AC
  Filled 2017-08-02: qty 2

## 2017-08-02 SURGICAL SUPPLY — 18 items
BALLN EMERGE MR 2.5X15 (BALLOONS) ×2
BALLN ~~LOC~~ EMERGE MR 3.0X20 (BALLOONS) ×2
BALLOON EMERGE MR 2.5X15 (BALLOONS) IMPLANT
BALLOON ~~LOC~~ EMERGE MR 3.0X20 (BALLOONS) IMPLANT
CATH INFINITI 5 FR JL3.5 (CATHETERS) ×1 IMPLANT
CATH INFINITI 5FR MULTPACK ANG (CATHETERS) ×1 IMPLANT
CATH VISTA GUIDE 6FR IM 90 CM (CATHETERS) ×1 IMPLANT
CATH VISTA GUIDE 6FR JR3.5 (CATHETERS) ×1 IMPLANT
KIT ENCORE 26 ADVANTAGE (KITS) ×1 IMPLANT
KIT HEART LEFT (KITS) ×2 IMPLANT
PACK CARDIAC CATHETERIZATION (CUSTOM PROCEDURE TRAY) ×2 IMPLANT
SHEATH PINNACLE 5F 10CM (SHEATH) ×1 IMPLANT
SHEATH PINNACLE 6F 10CM (SHEATH) ×1 IMPLANT
STENT SIERRA 2.75 X 23 MM (Permanent Stent) ×1 IMPLANT
SYR MEDRAD MARK V 150ML (SYRINGE) ×2 IMPLANT
TRANSDUCER W/STOPCOCK (MISCELLANEOUS) ×2 IMPLANT
WIRE EMERALD 3MM-J .035X150CM (WIRE) ×1 IMPLANT
WIRE RUNTHROUGH .014X180CM (WIRE) ×1 IMPLANT

## 2017-08-02 NOTE — Progress Notes (Signed)
ANTICOAGULATION CONSULT NOTE  Pharmacy Consult for Heparin Indication: chest pain/ACS  No Known Allergies  Patient Measurements: Height: 5\' 4"  (162.6 cm) Weight: 179 lb 14.3 oz (81.6 kg) IBW/kg (Calculated) : 54.7 Heparin Dosing Weight: 72.2kg  Vital Signs: Temp: 98.1 F (36.7 C) (12/24 0818) Temp Source: Oral (12/24 0818) BP: 130/83 (12/24 0818) Pulse Rate: 56 (12/24 0818)  Labs: Recent Labs    07/31/17 1302  07/31/17 1756  07/31/17 2312 08/01/17 0255 08/01/17 0835 08/02/17 0157 08/02/17 1144  HGB 14.8  --   --   --   --  13.3  --  13.1  --   HCT 44.9  --   --   --   --  40.6  --  40.4  --   PLT 262  --   --   --   --  268  --  268  --   LABPROT  --   --  14.9  --   --   --   --   --   --   INR  --   --  1.18  --   --   --   --   --   --   HEPARINUNFRC  --   --   --    < > 0.16*  --  0.27* 0.88* 0.71*  CREATININE 0.72  --   --   --   --  0.75  --   --   --   TROPONINI  --    < > 6.96*  --  6.17* 5.52*  --   --   --    < > = values in this interval not displayed.    Estimated Creatinine Clearance: 82.2 mL/min (by C-G formula based on SCr of 0.75 mg/dL).  Medical History: Past Medical History:  Diagnosis Date  . Diabetes mellitus without complication (HCC)   . Hypertension    Assessment: [age] yof presented to the ED with abdominal pain and chest pain. Troponin is elevated. To start IV heparin. Baseline CBC is WNL and she it not on anticoagulation PTA.   12/24 AM: heparin level supra-therapeutic this AM and rate dropped to 1050 units/hr 12/24 12N:  HL 0.71 and within upper goal range on current rate.  Goal of Therapy:  Heparin level 0.3-0.7 units/ml Monitor platelets by anticoagulation protocol: Yes   Plan:  Dec heparin to 1000 units/hr to avoid overshooting target. F/U daily HL/CBC Monitor for s/s of bleeding complications  Nadara MustardNita Brianny Soulliere, PharmD., MS Clinical Pharmacist Pager:  [phone] Thank you for allowing pharmacy to be part of this patients  care team. 08/02/2017,12:25 PM

## 2017-08-02 NOTE — Progress Notes (Signed)
Pt refused second IV to be placed. RN explained to pt that she needs a second IV site  since she's going for her procedure this morning. Pt still refused. Will continue to monitor pt.

## 2017-08-02 NOTE — Progress Notes (Signed)
ANTICOAGULATION CONSULT NOTE  Pharmacy Consult for Heparin Indication: chest pain/ACS  No Known Allergies  Patient Measurements: Height: 5\' 4"  (162.6 cm) Weight: 177 lb 4 oz (80.4 kg) IBW/kg (Calculated) : 54.7 Heparin Dosing Weight: 72.2kg  Vital Signs: Temp: 98 F (36.7 C) (12/23 2327) Temp Source: Oral (12/23 2327) BP: 130/81 (12/23 2327) Pulse Rate: 77 (12/23 2327)  Labs: Recent Labs    07/31/17 1302  07/31/17 1756 07/31/17 2312 08/01/17 0255 08/01/17 0835 08/02/17 0157  HGB 14.8  --   --   --  13.3  --  13.1  HCT 44.9  --   --   --  40.6  --  40.4  PLT 262  --   --   --  268  --  268  LABPROT  --   --  14.9  --   --   --   --   INR  --   --  1.18  --   --   --   --   HEPARINUNFRC  --   --   --  0.16*  --  0.27* 0.88*  CREATININE 0.72  --   --   --  0.75  --   --   TROPONINI  --    < > 6.96* 6.17* 5.52*  --   --    < > = values in this interval not displayed.    Estimated Creatinine Clearance: 81.5 mL/min (by C-G formula based on SCr of 0.75 mg/dL).   Medical History: Past Medical History:  Diagnosis Date  . Diabetes mellitus without complication (HCC)   . Hypertension    Assessment: 89 yof presented to the ED with abdominal pain and chest pain. Troponin is elevated. To start IV heparin. Baseline CBC is WNL and she it not on anticoagulation PTA.   12/24 AM: heparin level supra-therapeutic this AM  Goal of Therapy:  Heparin level 0.3-0.7 units/ml Monitor platelets by anticoagulation protocol: Yes   Plan:  Dec heparin back to 1050 units/hr 1200 HL  Darlene Pugh, Darlene Pugh 08/02/2017,4:09 AM

## 2017-08-02 NOTE — Progress Notes (Signed)
Pt does not want to be stuck again [facility] Consuello Masseimmons, Sophia Cubero M

## 2017-08-02 NOTE — Interval H&P Note (Signed)
Cath Lab Visit (complete for each Cath Lab visit)  Clinical Evaluation Leading to the Procedure:   ACS: Yes.    Non-ACS:    Anginal Classification: CCS IV  Anti-ischemic medical therapy: Maximal Therapy (2 or more classes of medications)  Non-Invasive Test Results: No non-invasive testing performed  Prior CABG: No previous CABG      History and Physical Interval Note:  08/02/2017 12:27 PM  Franco NonesJacqueline L Ardoin  has presented today for surgery, with the diagnosis of NSTEMI  The various methods of treatment have been discussed with the patient and family. After consideration of risks, benefits and other options for treatment, the patient has consented to  Procedure(s): LEFT HEART CATH AND CORONARY ANGIOGRAPHY (N/A) as a surgical intervention .  The patient's history has been reviewed, patient examined, no change in status, stable for surgery.  I have reviewed the patient's chart and labs.  Questions were answered to the patient's satisfaction.     Rinaldo CloudHarwani, Chevy Sweigert

## 2017-08-03 LAB — CBC
HEMATOCRIT: 37.4 % (ref 36.0–46.0)
HEMOGLOBIN: 12.5 g/dL (ref 12.0–15.0)
MCH: 31.3 pg (ref 26.0–34.0)
MCHC: 33.4 g/dL (ref 30.0–36.0)
MCV: 93.5 fL (ref 78.0–100.0)
Platelets: 250 10*3/uL (ref 150–400)
RBC: 4 MIL/uL (ref 3.87–5.11)
RDW: 12.7 % (ref 11.5–15.5)
WBC: 7.8 10*3/uL (ref 4.0–10.5)

## 2017-08-03 LAB — BASIC METABOLIC PANEL
ANION GAP: 8 (ref 5–15)
BUN: 6 mg/dL (ref 6–20)
CHLORIDE: 107 mmol/L (ref 101–111)
CO2: 24 mmol/L (ref 22–32)
Calcium: 9.1 mg/dL (ref 8.9–10.3)
Creatinine, Ser: 0.73 mg/dL (ref 0.44–1.00)
GFR calc Af Amer: >60 mL/min (ref >60)
GFR calc non Af Amer: >60 mL/min (ref >60)
GLUCOSE: 123 mg/dL — AB (ref 65–99)
POTASSIUM: 3.7 mmol/L (ref 3.5–5.1)
Sodium: 139 mmol/L (ref 135–145)

## 2017-08-03 LAB — GLUCOSE, CAPILLARY: GLUCOSE-CAPILLARY: 108 mg/dL — AB (ref 65–99)

## 2017-08-03 MED ORDER — NITROGLYCERIN 0.4 MG SL SUBL: 0.4000 mg | SUBLINGUAL_TABLET | SUBLINGUAL | 12 refills | Status: DC | PRN

## 2017-08-03 MED ORDER — PANTOPRAZOLE SODIUM 40 MG PO TBEC: 40.0000 mg | DELAYED_RELEASE_TABLET | Freq: Every day | ORAL | 3 refills | Status: DC

## 2017-08-03 MED ORDER — METOPROLOL TARTRATE 25 MG PO TABS: 25.0000 mg | ORAL_TABLET | Freq: Two times a day (BID) | ORAL | 3 refills | Status: DC

## 2017-08-03 MED ORDER — ASPIRIN 81 MG PO TBEC: 81.0000 mg | DELAYED_RELEASE_TABLET | Freq: Every day | ORAL | 3 refills | Status: DC

## 2017-08-03 MED ORDER — TICAGRELOR 90 MG PO TABS: 90.0000 mg | ORAL_TABLET | Freq: Two times a day (BID) | ORAL | 3 refills | Status: DC

## 2017-08-03 MED ORDER — ATORVASTATIN CALCIUM 80 MG PO TABS: 80.0000 mg | ORAL_TABLET | Freq: Every day | ORAL | 3 refills | Status: DC

## 2017-08-03 MED ORDER — METFORMIN HCL 500 MG PO TABS: 500.0000 mg | ORAL_TABLET | Freq: Every day | ORAL | 0 refills | Status: DC

## 2017-08-03 MED ORDER — ACTIVE PARTNERSHIP FOR HEALTH OF YOUR HEART BOOK
Freq: Once | Status: AC
  Administered 2017-08-03: 1
  Filled 2017-08-03: qty 1

## 2017-08-03 MED ORDER — HEART ATTACK BOUNCING BOOK
Freq: Once | Status: AC
  Administered 2017-08-03: 02:00:00 1
  Filled 2017-08-03: qty 1

## 2017-08-03 MED ORDER — ANGIOPLASTY BOOK
Freq: Once | Status: AC
  Administered 2017-08-03: 1
  Filled 2017-08-03: qty 1

## 2017-08-03 NOTE — Discharge Instructions (Signed)
Acute Coronary Syndrome °Acute coronary syndrome (ACS) is a serious problem in which there is suddenly not enough blood and oxygen reaching the heart. ACS can result in chest pain or a heart attack. °What are the causes? °This condition may be caused by: °· A buildup of fat and cholesterol inside of the arteries (atherosclerosis). This is the most common cause. The buildup (plaque) can cause the blood vessels in your heart (coronary arteries) to become narrow or blocked. Plaque can also break off to form a clot. °· A coronary spasm. °· A tearing of the coronary artery (spontaneous coronary artery dissection). °· Low blood pressure (hypotension). °· An abnormal heart beat (arrhythmia). °· Using cocaine or methamphetamine. ° °What increases the risk? °The following factors may make you more likely to develop this condition: °· Age. °· History of chest pain, heart attack, or stroke. °· Being female. °· Family history of chest pain, heart disease, or stroke. °· Smoking. °· Inactivity. °· Being overweight. °· High cholesterol. °· High blood pressure (hypertension). °· Diabetes. °· Excessive alcohol use. ° °What are the signs or symptoms? °Common symptoms of this condition include: °· Chest pain. The pain may last long, or may stop and come back (recur). It may feel like: °? Crushing or squeezing. °? Tightness, pressure, fullness, or heaviness. °· Arm, neck, jaw, or back pain. °· Heartburn or indigestion. °· Shortness of breath. °· Nausea. °· Sudden cold sweats. °· Lightheadedness. °· Dizziness. °· Tiredness (fatigue). ° °Sometimes there are no symptoms. °How is this diagnosed? °This condition may be diagnosed through: °· An electrocardiogram (ECG). This test records the impulses of the heart. °· Blood tests. °· A CT scan of the chest. °· A coronary angiogram. This procedure checks for a blockage in the coronary arteries. ° °How is this treated? °Treatment for this condition may include: °· Oxygen. °· Medicines, such  as: °? Antiplatelet medicines and blood-thinning medicines, such as aspirin. These help prevent blood clots. °? Fibrinolytic therapy. This breaks apart a blood clot. °? Blood pressure medicines. °? Nitroglycerin. °? Pain medicine. °? Cholesterol medicine. °· A procedure called coronary angioplasty and stenting. This is done to widen a narrowed artery and keep it open. °· Coronary artery bypass surgery. This allows blood to pass the blockage to reach your heart. °· Cardiac rehabilitation. This is a program that helps improve your health and well-being. It includes exercise training, education, and counseling to help you recover. ° °Follow these instructions at home: °Eating and drinking °· Follow a heart-healthy, low-salt (sodium) diet. °· Use healthy cooking methods such as roasting, grilling, broiling, baking, poaching, steaming, or stir-frying. °· Talk to a dietitian to learn about healthy cooking methods and how to eat less sodium. °Medicines °· Take over-the-counter and prescription medicines only as told by your health care provider. °· Do not take these medicines unless your health care provider approves: °? Nonsteroidal anti-inflammatory drugs (NSAIDs), such as ibuprofen, naproxen, or celecoxib. °? Vitamin supplements that contain vitamin A or vitamin E. °? Hormone replacement therapy that contains estrogen. °Activity °· Join a cardiac rehabilitation program. °· Ask your health care provider: °? What activities and exercises are safe for you. °? If you should follow specific instructions about lifting, driving, or climbing stairs. °· If you are taking aspirin and another blood thinning medicine, avoid activities that are likely to result in an injury. The medicines can increase your risk of bleeding. °Lifestyle °· Do not use any products that contain nicotine or tobacco, such as cigarettes   and e-cigarettes. If you need help quitting, ask your health care provider. °· If you drink alcohol and your health care  provider says it is okay to drink, limit your alcohol intake to no more than 1 drink per day. One drink equals 12 oz of beer, 5 oz of wine, or 1½ oz of hard liquor. °· Maintain a healthy weight. If you need to lose weight, do it in a way that has been approved by your health care provider. °General instructions °· Tell all your health care providers about your heart condition, including your dentist. Some medicines can increase your risk of arrhythmia. °· Manage other health conditions, such as hypertension and diabetes. These conditions affect your heart. °· Learn ways to manage stress. °· Get screened for depression, and seek treatment if needed. °· Monitor your blood pressure if told by your health care provider. °· Keep your vaccinations up to date. Get the annual influenza vaccine. °· Keep all follow-up visits as told by your health care provider. This is important. °Contact a health care provider if: °· You feel overwhelmed or sad. °· You have trouble with your daily activities. °Get help right away if: °· You have pain in your chest, neck, arm, jaw, stomach, or back that recurs, and: °? Lasts more than a few minutes. °? Is not relieved by taking the medicineyour health care provider prescribed. °· You have unexplained: °? Heavy sweating. °? Heartburn or indigestion. °? Shortness of breath. °? Difficulty breathing. °? Nausea or vomiting. °? Fatigue. °? Nervousness or anxiety. °? Weakness. °? Diarrhea. °? Dark stools or blood in the stool. °· You have sudden lightheadedness or dizziness. °· Your blood pressure is higher than 180/120 °· You faint. °· You feel like hurting yourself or think about taking your own life. °These symptoms may represent a serious problem that is an emergency. Do not wait to see if the symptoms will go away. Get medical help right away. Call your local emergency services (911 in the [place] Do not drive yourself to the clinic or hospital. °Summary °· Acute coronary syndrome (ACS) is a  when there is not enough blood and oxygen being supplied to the heart. ACS can result in chest pain or a heart attack. °· Acute coronary syndrome is a medical emergency. If you have any symptoms of this condition, get help right away. °· Treatment includes oxygen, medicines, and procedures to open the blocked arteries and restore blood flow. °This information is not intended to replace advice given to you by your health care provider. Make sure you discuss any questions you have with your health care provider. °Document Released: 07/27/2005 Document Revised: 08/28/2016 Document Reviewed: 08/28/2016 °Elsevier Interactive Patient Education © 2018 [employer] ° °Coronary Angiogram With Stent °Coronary angiogram with stent placement is a procedure to widen or open a narrow blood vessel of the heart (coronary artery). Arteries may become blocked by cholesterol buildup (plaques) in the lining of the wall. When a coronary artery becomes partially blocked, blood flow to that area decreases. This may lead to chest pain or a heart attack (myocardial infarction). °A stent is a small piece of metal that looks like mesh or a spring. Stent placement may be done as treatment for a heart attack or right after a coronary angiogram in which a blocked artery is found. °Let your health care provider know about: °· Any allergies you have. °· All medicines you are taking, including vitamins, herbs, eye drops, creams, and over-the-counter medicines. °· Any   problems you or family members have had with anesthetic medicines. °· Any blood disorders you have. °· Any surgeries you have had. °· Any medical conditions you have. °· Whether you are pregnant or may be pregnant. °What are the risks? °Generally, this is a safe procedure. However, problems may occur, including: °· Damage to the heart or its blood vessels. °· A return of blockage. °· Bleeding, infection, or bruising at the insertion site. °· A collection of blood under the skin  (hematoma) at the insertion site. °· A blood clot in another part of the body. °· Kidney injury. °· Allergic reaction to the dye or contrast that is used. °· Bleeding into the abdomen (retroperitoneal bleeding). ° °What happens before the procedure? °Staying hydrated °Follow instructions from your health care provider about hydration, which may include: °· Up to 2 hours before the procedure - you may continue to drink clear liquids, such as water, clear fruit juice, black coffee, and plain tea. ° °Eating and drinking restrictions °Follow instructions from your health care provider about eating and drinking, which may include: °· 8 hours before the procedure - stop eating heavy meals or foods such as meat, fried foods, or fatty foods. °· 6 hours before the procedure - stop eating light meals or foods, such as toast or cereal. °· 2 hours before the procedure - stop drinking clear liquids. ° °Ask your health care provider about: °· Changing or stopping your regular medicines. This is especially important if you are taking diabetes medicines or blood thinners. °· Taking medicines such as ibuprofen. These medicines can thin your blood. Do not take these medicines before your procedure if your health care provider instructs you not to. Generally, aspirin is recommended before a procedure of passing a small, thin tube (catheter) through a blood vessel and into the heart (cardiac catheterization). ° °What happens during the procedure? °· An IV tube will be inserted into one of your veins. °· You will be given one or more of the following: °? A medicine to help you relax (sedative). °? A medicine to numb the area where the catheter will be inserted into an artery (local anesthetic). °· To reduce your risk of infection: °? Your health care team will wash or sanitize their hands. °? Your skin will be washed with soap. °? Hair may be removed from the area where the catheter will be inserted. °· Using a guide wire, the catheter  will be inserted into an artery. The location may be in your groin, in your wrist, or in the fold of your arm (near your elbow). °· A type of X-ray (fluoroscopy) will be used to help guide the catheter to the opening of the arteries in the heart. °· A dye will be injected into the catheter, and X-rays will be taken. The dye will help to show where any narrowing or blockages are located in the arteries. °· A tiny wire will be guided to the blocked spot, and a balloon will be inflated to make the artery wider. °· The stent will be expanded and will crush the plaques into the wall of the vessel. The stent will hold the area open and improve the blood flow. Most stents have a drug coating to reduce the risk of the stent narrowing over time. °· The artery may be made wider using a drill, laser, or other tools to remove plaques. °· When the blood flow is better, the catheter will be removed. The lining of the artery will   grow over the stent, which stays where it was placed. °This procedure may vary among health care providers and hospitals. °What happens after the procedure? °· If the procedure is done through the leg, you will be kept in bed lying flat for about 6 hours. You will be instructed to not bend and not cross your legs. °· The insertion site will be checked frequently. °· The pulse in your foot or wrist will be checked frequently. °· You may have additional blood tests, X-rays, and a test that records the electrical activity of your heart (electrocardiogram, or ECG). °This information is not intended to replace advice given to you by your health care provider. Make sure you discuss any questions you have with your health care provider. °Document Released: 01/31/2003 Document Revised: 03/26/2016 Document Reviewed: 03/01/2016 °Elsevier Interactive Patient Education © 2018 [employer] ° °

## 2017-08-03 NOTE — Discharge Summary (Signed)
Discharge summary dictated on 08/03/2017 dictation number is [phone]

## 2017-08-03 NOTE — Discharge Summary (Signed)
Darlene Pugh:  Darlene Pugh             ACCOUNT NO.:  [id number]  MEDICAL RECORD NO.:  [id number]  LOCATION:                                 FACILITY:  PHYSICIAN:  Brailyn Delman N. Sharyn Pugh, M.D.      DATE OF BIRTH:  DATE OF ADMISSION:  07/31/2017 DATE OF DISCHARGE:  08/03/2017                              DISCHARGE SUMMARY   ADMITTING DIAGNOSES: 1. Acute non-Q-wave myocardial infarction, probably involving right     coronary artery. 2. Hypertension. 3. Diabetes mellitus. 4. Morbid obesity. 5. Tobacco abuse.  DISCHARGE DIAGNOSES: 1. Status post acute non-Q-wave myocardial infarction, status post     left cardiac catheterization/percutaneous transluminal coronary     angioplasty stenting to mid right coronary artery. 2. Hypertension. 3. Diabetes mellitus. 4. Hyperlipidemia. 5. Morbid obesity. 6. Tobacco abuse. 7. Peripheral vascular disease.  DISCHARGE HOME MEDICATIONS: 1. Aspirin 81 mg 1 tablet daily. 2. Metoprolol 25 mg 1 tablet twice daily. 3. Nitrostat 0.4 mg sublingual p.r.n. 4. Protonix 40 mg 1 tablet daily. 5. Brilinta 90 mg 1 tablet twice daily. 6. Gabapentin 100 mg daily at night. 7. Lisinopril 20 mg daily. 8. Metformin 500 mg daily, starting from tomorrow. 9. Seroquel 25 mg daily at bedtime. 10.Vitamin D 50,000 units once a week. 11.Atorvastatin 80 mg 1 tablet daily.  DIET:  Low-salt, low-cholesterol, 1800 calories ADA diet.  The patient has been advised to monitor blood pressure and blood sugar daily and chart.  Post-cardiac catheterization/PTCA stent instructions have been given. The patient will be scheduled for phase 2 cardiac rehab as outpatient.  CONDITION AT DISCHARGE:  Stable.  BRIEF HISTORY AND HOSPITAL COURSE:  Darlene Pugh is a 56 year old female with past medical history significant for hypertension, diabetes mellitus, morbid obesity, tobacco abuse.  She came to the ER complaining of left-sided chest pain/abdominal pain radiating to left shoulder and upper  back associated with nausea.  The patient did not seek any medical attention.  States chest pain lasted 2-3 hours and went to the bed. This morning, again felt similar pain, so decided to come to the ED. The patient denies any shortness of breath.  Denies palpitation, lightheadedness, or syncope.  EKG done in the ED showed normal sinus rhythm with minimal ST coving in inferior leads with minimal reciprocal changes.  The patient was noted to have elevated troponin I.  the patient presently denies any chest pain.  CT of the chest was done in the ED, which showed no evidence of pulmonary embolism or dissection. States she has not been on any antiplatelets medications.  PHYSICAL EXAMINATION:  GENERAL:  She was alert, awake, oriented x3, in no acute distress. VITAL SIGNS:  Blood pressure was 116/77, pulse 77. HEENT:  Conjunctivae were pink. NECK:  Supple.  No JVD.  No bruit. LUNGS:  Clear to auscultation without rhonchi or rales. CARDIOVASCULAR:  S1, S2 were normal.  There were soft systolic murmur and S3 gallop. ABDOMEN:  Soft, obese, nontender. EXTREMITIES:  There was no clubbing, cyanosis, or edema. NEURO:  Grossly intact.  LABORATORY DATA:  Sodium was 137, potassium 4.2, blood sugar was 174, BUN 10, creatinine 0.72.  Hemoglobin was 14.8, hematocrit 44.9, white count of 11.1.  Her  troponin I first set in the ED was 6.17.  Repeat troponins were 5.92, 6.96, 6.17, 5.52, which is trending down.  Her cholesterol was 274, HDL 37, triglycerides were 535.  Hemoglobin A1c was 7.8.  BRIEF HOSPITAL COURSE:  The patient was admitted to step-down unit.  The patient ruled in for acute non-Q-wave myocardial infarction with suggestion of changes in the inferior leads.  The patient did not have any episodes of chest pain during the hospital stay.  The patient subsequently underwent cardiac catheterization and PTCA stenting to mid RCA critical lesion.  The patient tolerated the procedure  well. Postprocedure, the patient did not have any episodes of chest pain during the hospital stay.  Her groin is stable.  The patient is ambulating in room without any problems.  The patient will receive phase 1 cardiac rehab before discharge home today and will be followed up in my office in 1 week.  The patient was extensively counseled regarding diet, compliance with medication, lifestyle changes, and smoking cessation to which he agrees.  The patient will be scheduled for phase 2 cardiac rehab as outpatient.     Darlene Pugh, M.D.     MNH/MEDQ  D:  08/03/2017  T:  08/03/2017  Job:  [id number]

## 2017-08-04 ENCOUNTER — Encounter (HOSPITAL_COMMUNITY): Payer: Self-pay | Admitting: Cardiology

## 2017-08-09 ENCOUNTER — Telehealth (HOSPITAL_COMMUNITY): Payer: Self-pay | Admitting: *Deleted

## 2017-08-09 NOTE — Telephone Encounter (Signed)
Pt discharged prior to receiving advisement by Cardiac Rehab phase I.  Message left for pt to to contact cardiac rehab.  Contact information provided. Alanson Alyarlette Carlton RN, BSN Cardiac and Emergency planning/management officerulmonary Rehab Nurse Navigator

## 2017-08-11 ENCOUNTER — Telehealth (HOSPITAL_COMMUNITY): Payer: Self-pay

## 2017-08-11 ENCOUNTER — Encounter (HOSPITAL_COMMUNITY): Payer: Self-pay

## 2017-08-11 NOTE — Telephone Encounter (Signed)
Attempted to call patient in regards to Insurance - No vm. °

## 2017-08-19 ENCOUNTER — Telehealth (HOSPITAL_COMMUNITY): Payer: Self-pay

## 2017-08-19 NOTE — Telephone Encounter (Signed)
Patient is insured with medicaid. Faxed over medicaid reimbursement form to Dr.Harwani.

## 2017-09-13 ENCOUNTER — Telehealth (HOSPITAL_COMMUNITY): Payer: Self-pay

## 2017-09-13 ENCOUNTER — Encounter (HOSPITAL_COMMUNITY): Payer: Self-pay

## 2017-09-13 NOTE — Telephone Encounter (Signed)
Attempted to call patient - Phone kept ringing with no voicemail. Tried twice - same thing. Sending letter. °

## 2017-09-13 NOTE — Telephone Encounter (Signed)
Attempted to call patient - Phone kept ringing with no voicemail. Tried twice - same thing. Sending letter.

## 2017-09-20 ENCOUNTER — Telehealth (HOSPITAL_COMMUNITY): Payer: Self-pay

## 2017-09-20 NOTE — Telephone Encounter (Signed)
2nd attempt to call patient - was able to leave vm.

## 2017-09-30 ENCOUNTER — Other Ambulatory Visit (HOSPITAL_COMMUNITY): Payer: Self-pay | Admitting: Internal Medicine

## 2017-10-06 ENCOUNTER — Telehealth (HOSPITAL_COMMUNITY): Payer: Self-pay

## 2017-10-06 NOTE — Telephone Encounter (Signed)
3rd attempt to call patient in regards to Cardiac Rehab - vm is full.

## 2018-01-21 IMAGING — DX DG CHEST 2V
2 series · 2 of 2 positions shown · non-contrast
Comparison: June 22, 2010

CLINICAL DATA: Chronic cough

EXAM:
CHEST  2 VIEW

[chest pa]
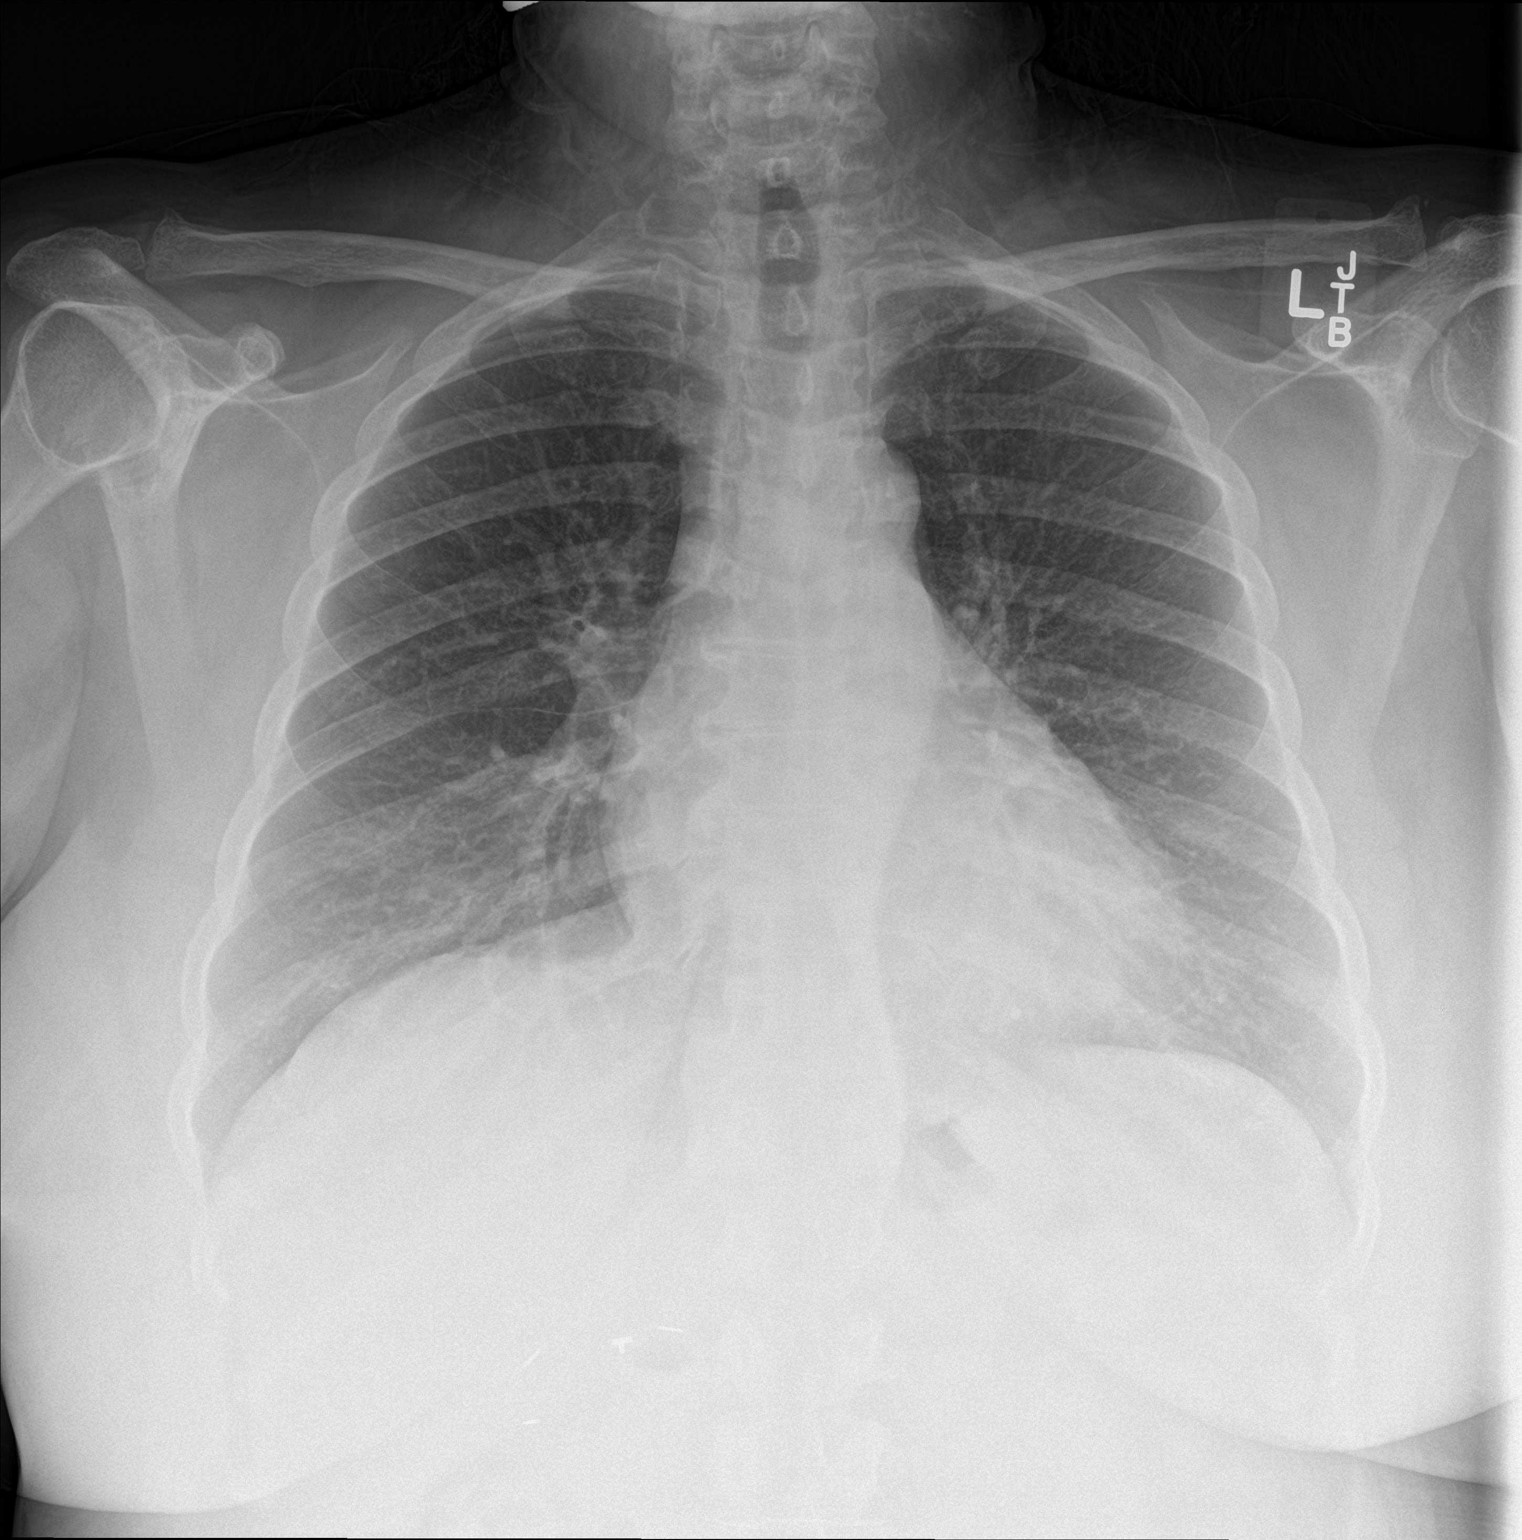

[chest lat]
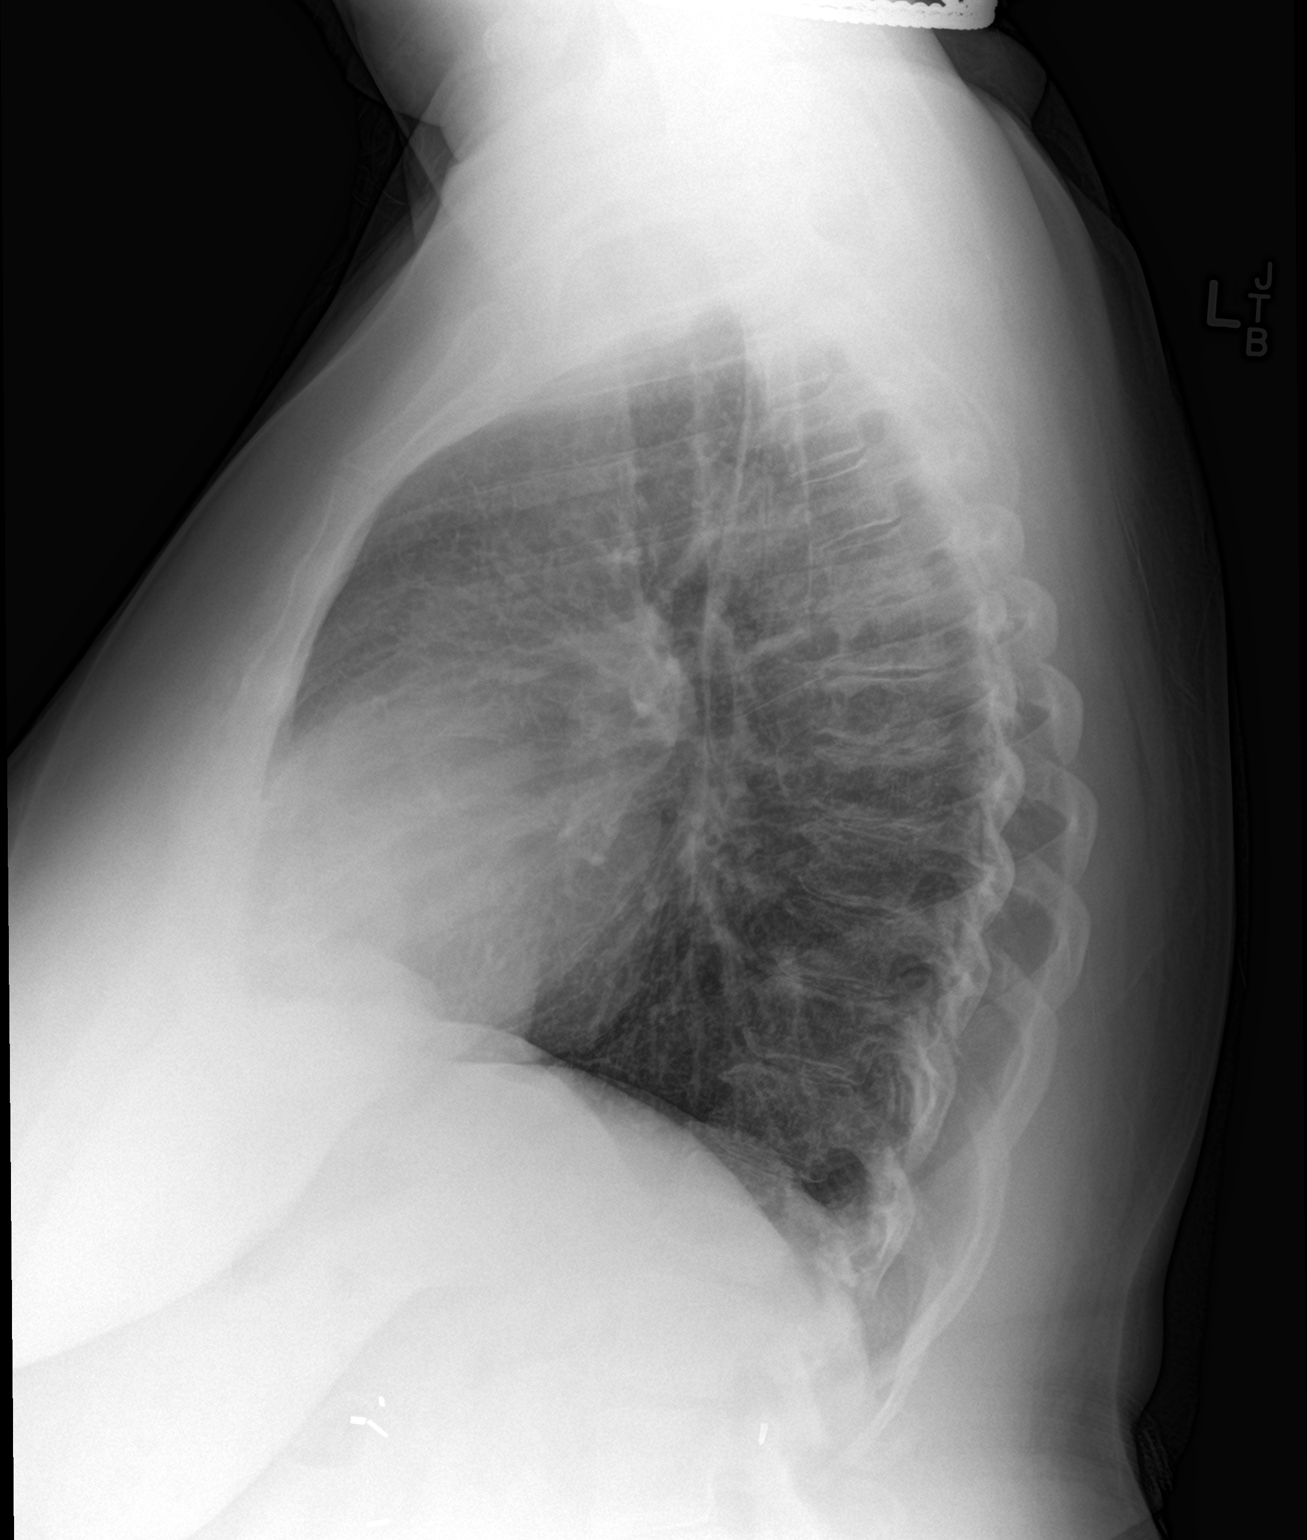

[2 of 2 positions shown; findings below may reference images not displayed]

FINDINGS: There is no appreciable edema or consolidation. The heart size and
pulmonary vascularity are normal. No adenopathy. There is
atherosclerotic calcification in the aorta. There is degenerative
change in the thoracic spine.
IMPRESSION: No edema or consolidation.  Aortic atherosclerosis.

## 2018-05-02 ENCOUNTER — Other Ambulatory Visit: Payer: Self-pay | Admitting: Family Medicine

## 2018-05-02 DIAGNOSIS — Z1231 Encounter for screening mammogram for malignant neoplasm of breast: Secondary | ICD-10-CM

## 2018-06-02 ENCOUNTER — Ambulatory Visit: Payer: No Typology Code available for payment source

## 2018-08-26 ENCOUNTER — Ambulatory Visit
Admission: RE | Admit: 2018-08-26 | Discharge: 2018-08-26 | Disposition: A | Discharge status: Home / Self Care | Payer: Medicaid Other | Source: Ambulatory Visit | Attending: Family Medicine | Admitting: Family Medicine

## 2018-08-26 DIAGNOSIS — Z1231 Encounter for screening mammogram for malignant neoplasm of breast: Secondary | ICD-10-CM

## 2018-12-15 DIAGNOSIS — E785 Hyperlipidemia, unspecified: Secondary | ICD-10-CM | POA: Diagnosis present

## 2019-08-30 ENCOUNTER — Other Ambulatory Visit: Payer: Self-pay | Admitting: Family Medicine

## 2019-08-30 DIAGNOSIS — N6320 Unspecified lump in the left breast, unspecified quadrant: Secondary | ICD-10-CM

## 2020-12-03 ENCOUNTER — Other Ambulatory Visit: Payer: Self-pay | Admitting: Family Medicine

## 2020-12-10 ENCOUNTER — Other Ambulatory Visit: Payer: Self-pay | Admitting: Family Medicine

## 2020-12-10 DIAGNOSIS — N644 Mastodynia: Secondary | ICD-10-CM

## 2020-12-10 DIAGNOSIS — N63 Unspecified lump in unspecified breast: Secondary | ICD-10-CM

## 2021-01-14 ENCOUNTER — Ambulatory Visit
Admission: RE | Admit: 2021-01-14 | Discharge: 2021-01-14 | Disposition: A | Discharge status: Home / Self Care | Payer: Medicaid Other | Source: Ambulatory Visit | Attending: Family Medicine | Admitting: Family Medicine

## 2021-01-14 ENCOUNTER — Other Ambulatory Visit: Payer: Self-pay

## 2021-01-14 ENCOUNTER — Other Ambulatory Visit: Payer: Self-pay | Admitting: Family Medicine

## 2021-01-14 DIAGNOSIS — N63 Unspecified lump in unspecified breast: Secondary | ICD-10-CM

## 2021-01-14 DIAGNOSIS — N644 Mastodynia: Secondary | ICD-10-CM

## 2021-02-05 ENCOUNTER — Other Ambulatory Visit: Payer: Self-pay

## 2021-02-05 ENCOUNTER — Ambulatory Visit
Admission: RE | Admit: 2021-02-05 | Discharge: 2021-02-05 | Disposition: A | Discharge status: Home / Self Care | Payer: Medicaid Other | Source: Ambulatory Visit | Attending: Family Medicine | Admitting: Family Medicine

## 2021-02-05 DIAGNOSIS — N644 Mastodynia: Secondary | ICD-10-CM

## 2021-02-05 DIAGNOSIS — N63 Unspecified lump in unspecified breast: Secondary | ICD-10-CM

## 2021-10-21 ENCOUNTER — Ambulatory Visit: Payer: Medicaid Other

## 2021-10-22 NOTE — Therapy (Signed)
?OUTPATIENT PHYSICAL THERAPY THORACOLUMBAR EVALUATION ? ? ?Patient Name: Darlene Pugh [facility] ?MRN: [id number] ?DOB:Feb 22, 1961, 61 y.o., female ?Today's Date: 10/23/2021 ? ? PT End of Session - 10/23/21 1259   ? ? Visit Number 1   ? Number of Visits 9   ? Date for PT Re-Evaluation 12/18/21   ? Authorization Type [facility]   ? Authorization Time Period 27VL   ? Authorization - Visit Number 1   ? Authorization - Number of Visits 27   ? PT Start Time 1215   ? PT Stop Time 1300   ? PT Time Calculation (min) 45 min   ? Activity Tolerance Patient tolerated treatment well   ? Behavior During Therapy [facility] for tasks assessed/performed   ? ?  ?  ? ?  ? ? ?Past Medical History:  ?Diagnosis Date  ? Diabetes mellitus without complication (HCC)   ? Hypertension   ? ?Past Surgical History:  ?Procedure Laterality Date  ? LEFT HEART CATH AND CORONARY ANGIOGRAPHY N/A 08/02/2017  ? Procedure: LEFT HEART CATH AND CORONARY ANGIOGRAPHY;  Surgeon: Rinaldo Cloud, MD;  [facility]: [facility];  Service: Cardiovascular;  Laterality: N/A;  ? ?Patient Active Problem List  ? Diagnosis Date Noted  ? Acute non Q wave MI (myocardial infarction), initial episode of care [facility]) 07/31/2017  ? ? ?PCP: Gwenyth Bender, MD ? ?REFERRING PROVIDER: Rhodia Albright, PA-C ? ?REFERRING DIAG: M54.51 (ICD-10-CM) - Vertebrogenic low back pain ? ?THERAPY DIAG:  ?Chronic bilateral low back pain, unspecified whether sciatica present ? ?Muscle weakness (generalized) ? ?ONSET DATE: Several years ago ? ?SUBJECTIVE:                                                                                                                                                                                          ? ?SUBJECTIVE STATEMENT: ?Pt reports primary c/o central low back pain of insidious onset lasting years. She adds that she also has Rt lateral hip and BIL knee pain, although the low back is the priority today. The pt denies N/T in her LE related to her LBP, although she  reports N/T in her feet likely related to diabetic neuropathy. Pt adds that her knee are stiff in the mornings and "creek" regularly.  Pt reports that her pain is achy at rest, but is sharp when exacerbated. Aggravating factors include standing >5-10 minutes, walking >15 minutes, bending, and lifting. Easing factors include ibuprofen, lidocaine, muscle relaxers, heating pad, and rest. Current pain is 1/10. Worst pain is 10/10. Best pain is 0/10. Red flag screening negative. ? ?PERTINENT HISTORY:  ?DMII, HTN, hx of MI ? ?PAIN:  ?  Are you having pain? Yes: NPRS scale: 1/10 ?Pain location: Midline low back ?Pain description: achy ?Aggravating factors: standing >5-10 minutes, walking >15 minutes, bending, and lifting ?Relieving factors: ibuprofen, lidocaine, muscle relaxers, heating pad, and rest ? ? ?PRECAUTIONS: None ? ?WEIGHT BEARING RESTRICTIONS No ? ?FALLS:  ?Has patient fallen in last 6 months? No, Number of falls: 0 ? ?LIVING ENVIRONMENT: ?Lives with: lives with their family ?Lives in: House/apartment ?Stairs: Yes; External: 4 steps; on right going up, on left going up, and can reach both ?Has following equipment at home: Quad cane small base ? ?OCCUPATION: On disability ? ?PLOF: Independent ? ?PATIENT GOALS Strengthen legs, walk for exercise, do household chores ? ? ?OBJECTIVE:  ? ?DIAGNOSTIC FINDINGS:  ?None current related to primary complaints ? ?PATIENT SURVEYS:  ?Modified Oswestry 21/50, 42%  ? ?SCREENING FOR RED FLAGS: ?Bowel or bladder incontinence: No ? ?Cauda equina syndrome: No ? ?COGNITION: ? Overall cognitive status: Within functional limits for tasks assessed   ?  ?SENSATION: ?WFL ? ?MUSCLE LENGTH: ?Hamstrings: mild limitation BIL ?Thomas test: mild limitation BIL ? ?POSTURE:  ?Decreased lumbar lordosis ? ?PALPATION: ?TTP to Rt>Lt lumbar paraspinals/ QL; TTP to Rt greater trochanter ? ?PASSIVE ACCESSORIES: ?Hypomobile and painful T12-L5 ? ?LUMBAR ROM:  ? ?Active  A/PROM  ?10/23/2021  ?Flexion WNL with  segmental lowering  ?Extension 50%  ?Right lateral flexion WNL, p!  ?Left lateral flexion WNL  ?Right rotation WNL  ?Left rotation WNL  ? (Blank rows = not tested) ? ? ?LE MMT: ? ?MMT Right ?10/23/2021 Left ?10/23/2021  ?Hip flexion 4/5 p! 4/5  ?Hip extension 4/5 4/5  ?Hip abduction 3+/5p! 4/5  ?Knee flexion 4/5 with audible crepitace 4/5 with audible crepitace  ?Knee extension 4/5 with audible crepitace 4/5 with audible crepitace  ? (Blank rows = not tested) ? ?LUMBAR SPECIAL TESTS:  ?PLE: (-) ?SLR: (-) ?Ober's: (+) on Rt ?Hip scour: (-) BIL ?FABER: (-) BIL ?FADDIR: (-) BIL ? ?FUNCTIONAL TESTS:  ?5xSTS: 25 seconds with knee pain ?Squat: 75% limited ?SLS: 3 seconds BIL ?Plank: 13 seconds ? ?GAIT: ?Distance walked: 41ft ?Assistive device utilized: None ?Level of assistance: Complete Independence ?Comments: Antalgic gait with decreased stance on Lt, decreased pelvic translation/ arm swing ? ? ? ?TODAY'S TREATMENT  ?Issued and demonstrated HEP ? ? ?PATIENT EDUCATION:  ?Education details: Pt educated on probable underlying pathophysiology, POC, ODI, prognosis, and HEP ?Person educated: Patient ?Education method: Explanation, Demonstration, and Handouts ?Education comprehension: verbalized understanding and returned demonstration ? ? ?HOME EXERCISE PROGRAM: ?Access Code: [email or web address] ?URL: [email or web address] ?Date: 10/23/2021 ?Prepared by: Carmelina Dane ? ?Exercises ?Sidelying ITB Stretch off Table - 1 x daily - 7 x weekly - 1 sets - 2-min hold ?Sidelying Hip Abduction - 1 x daily - 7 x weekly - 2 sets - 10 reps - 3-sec hold ?Supine Bridge - 1 x daily - 7 x weekly - 3 sets - 10 reps - 3-sec hold ?Abdominal Isometric Hold - FEET OFF TABLE* - 1 x daily - 7 x weekly - 3 sets - 30sec hold ? ? ?ASSESSMENT: ? ?CLINICAL IMPRESSION: ?Patient is a 61 y.o. F who was seen today for physical therapy evaluation and treatment for chronic LBP, as well as lateral Rt hip pain and BIL knee pain. Due to time constraints, the  majority of today's assessment focused on the low back. Pt's primary impairments include pain with BIL lumbar side bend AROM, limited trunk extension AROM, hypomobile and painful lumbar passive accessories, TTP to BIL Rt>Lt lumbar  paraspinals/ QL, TTP to Rt greater trochanter, severely limited squat, decreased functional mobility, tight Rt IT band, decreased balance, weak functional core strength, and weak global BIL hips and knees. Ruling up mechanical LBP related to core weakness. Also ruling up IT band syndrome vs. greater trochanteric pain syndrome due to negative internal hip derangement special testing, lateral hip pain, positive Ober's, and TTP to Rt greater trochanter. BIL knee OA is probable due to regular crepitus and report of morning stiffness, although further assessment is warranted. Pt will benefit from skilled PT to address her primary impairments and return to her prior level of function with less limitation. ? ? ?OBJECTIVE IMPAIRMENTS Abnormal gait, decreased balance, decreased endurance, decreased mobility, difficulty walking, decreased ROM, decreased strength, hypomobility, impaired flexibility, improper body mechanics, postural dysfunction, and pain.  ? ?ACTIVITY LIMITATIONS cleaning, community activity, driving, meal prep, laundry, yard work, and shopping.  ? ?PERSONAL FACTORS Time since onset of injury/illness/exacerbation and 2 comorbidities: HTN, DMII  are also affecting patient's functional outcome.  ? ? ?REHAB POTENTIAL: Fair due to multiple body parts and chronicity of sxs ? ?CLINICAL DECISION MAKING: Evolving/moderate complexity ? ?EVALUATION COMPLEXITY: Moderate ? ? ?GOALS: ?Goals reviewed with patient? Yes ? ?SHORT TERM GOALS: Target date: 11/20/2021 ? ?Pt will report understanding and adherence to her HEP in order to promote independence in the management of her primary impairments. ?Baseline: HEP provided at eval ?Goal status: INITIAL ? ? ?LONG TERM GOALS: Target date: 12/18/2021 ? ?Pt  will achieve an ODI score of 22% or lower in order to demonstrate improved functional ability as it relates to her low back pain. ?Baseline: 42% ?Goal status: INITIAL ? ?2.  Pt will achieve a 5xSTS of

## 2021-10-23 ENCOUNTER — Other Ambulatory Visit: Payer: Self-pay

## 2021-10-23 ENCOUNTER — Ambulatory Visit: Payer: Medicaid Other | Attending: Orthopedic Surgery

## 2021-10-23 DIAGNOSIS — R262 Difficulty in walking, not elsewhere classified: Secondary | ICD-10-CM | POA: Insufficient documentation

## 2021-10-23 DIAGNOSIS — M25562 Pain in left knee: Secondary | ICD-10-CM | POA: Diagnosis present

## 2021-10-23 DIAGNOSIS — M25561 Pain in right knee: Secondary | ICD-10-CM | POA: Insufficient documentation

## 2021-10-23 DIAGNOSIS — G8929 Other chronic pain: Secondary | ICD-10-CM | POA: Diagnosis present

## 2021-10-23 DIAGNOSIS — M25551 Pain in right hip: Secondary | ICD-10-CM | POA: Diagnosis present

## 2021-10-23 DIAGNOSIS — M545 Low back pain, unspecified: Secondary | ICD-10-CM | POA: Diagnosis present

## 2021-10-23 DIAGNOSIS — M6281 Muscle weakness (generalized): Secondary | ICD-10-CM | POA: Insufficient documentation

## 2021-10-29 NOTE — Therapy (Incomplete)
?OUTPATIENT PHYSICAL THERAPY TREATMENT NOTE ? ? ?Patient Name: Darlene Pugh [facility] ?MRN: [id number] ?DOB:1961/02/24, 61 y.o., female ?Today's Date: 10/29/2021 ? ?PCP: Gwenyth Bender, MD ?REFERRING PROVIDER: Rhodia Albright, PA-C ? ? ? ?Past Medical History:  ?Diagnosis Date  ? Diabetes mellitus without complication (HCC)   ? Hypertension   ? ?Past Surgical History:  ?Procedure Laterality Date  ? LEFT HEART CATH AND CORONARY ANGIOGRAPHY N/A 08/02/2017  ? Procedure: LEFT HEART CATH AND CORONARY ANGIOGRAPHY;  Surgeon: Rinaldo Cloud, MD;  Location: [facility];  Service: Cardiovascular;  Laterality: N/A;  ? ?Patient Active Problem List  ? Diagnosis Date Noted  ? Acute non Q wave MI (myocardial infarction), initial episode of care [facility]) 07/31/2017  ? ? ?REFERRING DIAG: M54.51 (ICD-10-CM) - Vertebrogenic low back pain ? ?THERAPY DIAG:  ?No diagnosis found. ? ?PERTINENT HISTORY: DMII, HTN, hx of MI ? ? ?SUBJECTIVE: *** ? ?PAIN:  ?Are you having pain? Yes: NPRS scale: 1/10 ?Pain location: Midline low back ?Pain description: achy ?Aggravating factors: standing >5-10 minutes, walking >15 minutes, bending, and lifting ?Relieving factors: ibuprofen, lidocaine, muscle relaxers, heating pad, and rest ? ? ? ? ? ?OBJECTIVE:  ? *Unless otherwise noted, objective information collected previously* ? ?DIAGNOSTIC FINDINGS:  ?None current related to primary complaints ?  ?PATIENT SURVEYS:  ?Modified Oswestry 21/50, 42%  ?  ?SCREENING FOR RED FLAGS: ?Bowel or bladder incontinence: No ?  ?Cauda equina syndrome: No ?  ?COGNITION: ?          Overall cognitive status: Within functional limits for tasks assessed               ?           ?SENSATION: ?WFL ?  ?MUSCLE LENGTH: ?Hamstrings: mild limitation BIL ?Thomas test: mild limitation BIL ?  ?POSTURE:  ?Decreased lumbar lordosis ?  ?PALPATION: ?TTP to Rt>Lt lumbar paraspinals/ QL; TTP to Rt greater trochanter ?  ?PASSIVE ACCESSORIES: ?Hypomobile and painful T12-L5 ?  ?LUMBAR ROM:  ?   ?Active  A/PROM  ?10/23/2021  ?Flexion WNL with segmental lowering  ?Extension 50%  ?Right lateral flexion WNL, p!  ?Left lateral flexion WNL  ?Right rotation WNL  ?Left rotation WNL  ? (Blank rows = not tested) ?  ?  ?LE MMT: ?  ?MMT Right ?10/23/2021 Left ?10/23/2021  ?Hip flexion 4/5 p! 4/5  ?Hip extension 4/5 4/5  ?Hip abduction 3+/5p! 4/5  ?Knee flexion 4/5 with audible crepitace 4/5 with audible crepitace  ?Knee extension 4/5 with audible crepitace 4/5 with audible crepitace  ? (Blank rows = not tested) ?  ?LUMBAR SPECIAL TESTS:  ?PLE: (-) ?SLR: (-) ?Ober's: (+) on Rt ?Hip scour: (-) BIL ?FABER: (-) BIL ?FADDIR: (-) BIL ?  ?FUNCTIONAL TESTS:  ?5xSTS: 25 seconds with knee pain ?Squat: 75% limited ?SLS: 3 seconds BIL ?Plank: 13 seconds ?  ?GAIT: ?Distance walked: 88ft ?Assistive device utilized: None ?Level of assistance: Complete Independence ?Comments: Antalgic gait with decreased stance on Lt, decreased pelvic translation/ arm swing ?  ?  ?  ?TODAY'S TREATMENT  ? ?[facility] Adult PT Treatment:                                                DATE: 10/30/2021 ?Therapeutic Exercise: ?*** ?Manual Therapy: ?*** ?Neuromuscular re-ed: ?*** ?Therapeutic Activity: ?*** ?Modalities: ?*** ?Self Care: ?*** ? ?  ?  ?  PATIENT EDUCATION:  ?Education details: Pt educated on probable underlying pathophysiology, POC, ODI, prognosis, and HEP ?Person educated: Patient ?Education method: Explanation, Demonstration, and Handouts ?Education comprehension: verbalized understanding and returned demonstration ?  ?  ?HOME EXERCISE PROGRAM: ?Access Code: [email or web address] ?URL: [email or web address] ?Date: 10/23/2021 ?Prepared by: Carmelina Dane ?  ?Exercises ?Sidelying ITB Stretch off Table - 1 x daily - 7 x weekly - 1 sets - 2-min hold ?Sidelying Hip Abduction - 1 x daily - 7 x weekly - 2 sets - 10 reps - 3-sec hold ?Supine Bridge - 1 x daily - 7 x weekly - 3 sets - 10 reps - 3-sec hold ?Abdominal Isometric Hold - FEET OFF TABLE* - 1 x  daily - 7 x weekly - 3 sets - 30sec hold ?  ?  ?ASSESSMENT: ?  ?CLINICAL IMPRESSION: ?*** ?  ?  ?OBJECTIVE IMPAIRMENTS Abnormal gait, decreased balance, decreased endurance, decreased mobility, difficulty walking, decreased ROM, decreased strength, hypomobility, impaired flexibility, improper body mechanics, postural dysfunction, and pain.  ?  ?ACTIVITY LIMITATIONS cleaning, community activity, driving, meal prep, laundry, yard work, and shopping.  ?  ?PERSONAL FACTORS Time since onset of injury/illness/exacerbation and 2 comorbidities: HTN, DMII  are also affecting patient's functional outcome.  ?  ?  ?REHAB POTENTIAL: Fair due to multiple body parts and chronicity of sxs ?  ?CLINICAL DECISION MAKING: Evolving/moderate complexity ?  ?EVALUATION COMPLEXITY: Moderate ?  ?  ?GOALS: ?Goals reviewed with patient? Yes ?  ?SHORT TERM GOALS: Target date: 11/20/2021 ?  ?Pt will report understanding and adherence to her HEP in order to promote independence in the management of her primary impairments. ?Baseline: HEP provided at eval ?Goal status: INITIAL ?  ?  ?LONG TERM GOALS: Target date: 12/18/2021 ?  ?Pt will achieve an ODI score of 22% or lower in order to demonstrate improved functional ability as it relates to her low back pain. ?Baseline: 42% ?Goal status: INITIAL ?  ?2.  Pt will achieve a 5xSTS of 12 seconds or less in order to promote safe transfers. ?Baseline: 25 seconds ?Goal status: INITIAL ?  ?3.  Pt will demonstrate a functional plank of 35 seconds or longer in order to promote prophylactic low back pain measures. ?Baseline: 13 seconds ?Goal status: INITIAL ?  ?4.  Pt will report ability to stand/ walk >30 minutes in order to grocery shop with less limitation. ?Baseline: Standing 5-10 minutes, walking 15 minutes ?Goal status: INITIAL ?  ?5.  Pt will achieve BIL global hip strength of 4+/5 or greater in order to progress to an advanced LE strengthening regimen with less limitation. ?Baseline: See MMT chart ?Goal  status: INITIAL ?  ?  ?  ?PLAN: ?PT FREQUENCY: 1x/week ?  ?PT DURATION: 8 weeks ?  ?PLANNED INTERVENTIONS: Therapeutic exercises, Therapeutic activity, Neuromuscular re-education, Balance training, Gait training, Patient/Family education, Joint manipulation, Joint mobilization, Stair training, Aquatic Therapy, Dry Needling, Electrical stimulation, Spinal manipulation, Spinal mobilization, Cryotherapy, Moist heat, Taping, Vasopneumatic device, Traction, and Manual therapy. ?  ?PLAN FOR NEXT SESSION: Progress core/ hip strengthening; closed-chain exercises as able ? ? ? ?Carmelina Dane, PT, DPT ?10/29/21 11:01 AM ? ? ?  ? ?

## 2021-10-30 ENCOUNTER — Ambulatory Visit: Payer: Medicaid Other

## 2021-11-05 ENCOUNTER — Other Ambulatory Visit: Payer: Self-pay

## 2021-11-05 ENCOUNTER — Ambulatory Visit: Payer: Medicaid Other

## 2021-11-05 DIAGNOSIS — M545 Low back pain, unspecified: Secondary | ICD-10-CM

## 2021-11-05 DIAGNOSIS — G8929 Other chronic pain: Secondary | ICD-10-CM

## 2021-11-05 DIAGNOSIS — M6281 Muscle weakness (generalized): Secondary | ICD-10-CM

## 2021-11-05 DIAGNOSIS — M25551 Pain in right hip: Secondary | ICD-10-CM

## 2021-11-05 DIAGNOSIS — R262 Difficulty in walking, not elsewhere classified: Secondary | ICD-10-CM

## 2021-11-05 NOTE — Therapy (Signed)
?OUTPATIENT PHYSICAL THERAPY TREATMENT NOTE ? ? ?Patient Name: Darlene Pugh [facility] ?MRN: [id number] ?DOB:1960/10/05, 61 y.o., female ?Today's Date: 11/05/2021 ? ?PCP: Rogers Blocker, MD ?REFERRING PROVIDER: Charlyne Petrin, PA-C ? ? PT End of Session - 11/05/21 1200   ? ? Visit Number 2   ? Number of Visits 9   ? Date for PT Re-Evaluation 12/18/21   ? Authorization Type [facility]   ? Authorization Time Period 27VL   ? Authorization - Visit Number 2   ? Authorization - Number of Visits 27   ? PT Start Time 1213   ? PT Stop Time 1258   ? PT Time Calculation (min) 45 min   ? Activity Tolerance Patient tolerated treatment well   ? Behavior During Therapy [facility] for tasks assessed/performed   ? ?  ?  ? ?  ? ? ?Past Medical History:  ?Diagnosis Date  ? Diabetes mellitus without complication ([place])   ? Hypertension   ? ?Past Surgical History:  ?Procedure Laterality Date  ? LEFT HEART CATH AND CORONARY ANGIOGRAPHY N/A 08/02/2017  ? Procedure: LEFT HEART CATH AND CORONARY ANGIOGRAPHY;  Surgeon: Charolette Forward, MD;  Location: [place] CV LAB;  Service: Cardiovascular;  Laterality: N/A;  ? ?Patient Active Problem List  ? Diagnosis Date Noted  ? Acute non Q wave MI (myocardial infarction), initial episode of care [facility]) 07/31/2017  ? ? ?REFERRING DIAG: M54.51 (ICD-10-CM) - Vertebrogenic low back pain ? ?THERAPY DIAG:  ?Chronic bilateral low back pain, unspecified whether sciatica present ? ?Muscle weakness (generalized) ? ?Pain in right hip ? ?Chronic pain of left knee ? ?Chronic pain of right knee ? ?Difficulty in walking, not elsewhere classified ? ?PERTINENT HISTORY: DMII, HTN, hx of MI ? ? ?SUBJECTIVE: Pt reports no pain today, adding that she has been doing her HEP since her eval with good results. ? ?PAIN:  ?Are you having pain? Yes: NPRS scale: 0/10 ?Pain location: Midline low back ?Pain description: achy ?Aggravating factors: standing >5-10 minutes, walking >15 minutes, bending, and lifting ?Relieving factors: ibuprofen,  lidocaine, muscle relaxers, heating pad, and rest ? ? ? ? ? ?OBJECTIVE:  ? *Unless otherwise noted, objective information collected previously* ? ?DIAGNOSTIC FINDINGS:  ?None current related to primary complaints ?  ?PATIENT SURVEYS:  ?Modified Oswestry 21/50, 42%  ?  ?SCREENING FOR RED FLAGS: ?Bowel or bladder incontinence: No ?  ?Cauda equina syndrome: No ?  ?COGNITION: ?          Overall cognitive status: Within functional limits for tasks assessed               ?           ?SENSATION: ?WFL ?  ?MUSCLE LENGTH: ?Hamstrings: mild limitation BIL ?Thomas test: mild limitation BIL ?  ?POSTURE:  ?Decreased lumbar lordosis ?  ?PALPATION: ?TTP to Rt>Lt lumbar paraspinals/ QL; TTP to Rt greater trochanter ?  ?PASSIVE ACCESSORIES: ?Hypomobile and painful T12-L5 ?  ?LUMBAR ROM:  ?  ?Active  A/PROM  ?10/23/2021  ?Flexion WNL with segmental lowering  ?Extension 50%  ?Right lateral flexion WNL, p!  ?Left lateral flexion WNL  ?Right rotation WNL  ?Left rotation WNL  ? (Blank rows = not tested) ?  ?  ?LE MMT: ?  ?MMT Right ?10/23/2021 Left ?10/23/2021  ?Hip flexion 4/5 p! 4/5  ?Hip extension 4/5 4/5  ?Hip abduction 3+/5p! 4/5  ?Knee flexion 4/5 with audible crepitace 4/5 with audible crepitace  ?Knee extension 4/5 with audible crepitace 4/5  with audible crepitace  ? (Blank rows = not tested) ?  ?LUMBAR SPECIAL TESTS:  ?PLE: (-) ?SLR: (-) ?Ober's: (+) on Rt ?Hip scour: (-) BIL ?FABER: (-) BIL ?FADDIR: (-) BIL ?  ?FUNCTIONAL TESTS:  ?5xSTS: 25 seconds with knee pain ?Squat: 75% limited ?SLS: 3 seconds BIL ?Plank: 13 seconds ?  ?GAIT: ?Distance walked: 19ft ?Assistive device utilized: None ?Level of assistance: Complete Independence ?Comments: Antalgic gait with decreased stance on Lt, decreased pelvic translation/ arm swing ?  ?  ?  ?TODAY'S TREATMENT  ? ?[facility] Adult PT Treatment:                                                DATE: 11/05/2021 ?Therapeutic Exercise: ?Knee planks 3x30sec ?Prone press-up 3x30sec ?Prone swimmers  3x20 ?Standing abdominal press-down with 20# cable 3x10 ?Standing Pallof press with 7# cable 2x10 with 5sec hold BIL ?Mini-squat side step walkout with 7# waist attachment at [facility] x5 walkouts BIL ?Manual Therapy: ?N/A ?Neuromuscular re-ed: ?N/A ?Therapeutic Activity: ?N/A ?Modalities: ?N/A ?Self Care: ?N/A ? ?  ?  ?PATIENT EDUCATION:  ?Education details: Pt educated on probable underlying pathophysiology, POC, ODI, prognosis, and HEP ?Person educated: Patient ?Education method: Explanation, Demonstration, and Handouts ?Education comprehension: verbalized understanding and returned demonstration ?  ?  ?HOME EXERCISE PROGRAM: ?Access Code: [email or web address] ?URL: [email or web address] ?Date: 10/23/2021 ?Prepared by: Vanessa [place] ?  ?Exercises ?Sidelying ITB Stretch off Table - 1 x daily - 7 x weekly - 1 sets - 2-min hold ?Sidelying Hip Abduction - 1 x daily - 7 x weekly - 2 sets - 10 reps - 3-sec hold ?Supine Bridge - 1 x daily - 7 x weekly - 3 sets - 10 reps - 3-sec hold ?Abdominal Isometric Hold - FEET OFF TABLE* - 1 x daily - 7 x weekly - 3 sets - 30sec hold ?  ?  ?ASSESSMENT: ?  ?CLINICAL IMPRESSION: ?Pt responded well to all interventions today, demonstrating good form and mild pain with selected exercises. She demonstrates adequate baseline functional lifting capacity at this time. She will continue to benefit from skilled PT to address her primary impairments and return to her prior level of function with less limitation.  ?  ?  ?OBJECTIVE IMPAIRMENTS Abnormal gait, decreased balance, decreased endurance, decreased mobility, difficulty walking, decreased ROM, decreased strength, hypomobility, impaired flexibility, improper body mechanics, postural dysfunction, and pain.  ?  ?ACTIVITY LIMITATIONS cleaning, community activity, driving, meal prep, laundry, yard work, and shopping.  ?  ?PERSONAL FACTORS Time since onset of injury/illness/exacerbation and 2 comorbidities: HTN, DMII  are also  affecting patient's functional outcome.  ?  ?  ?REHAB POTENTIAL: Fair due to multiple body parts and chronicity of sxs ?  ?CLINICAL DECISION MAKING: Evolving/moderate complexity ?  ?EVALUATION COMPLEXITY: Moderate ?  ?  ?GOALS: ?Goals reviewed with patient? Yes ?  ?SHORT TERM GOALS: Target date: 11/20/2021 ?  ?Pt will report understanding and adherence to her HEP in order to promote independence in the management of her primary impairments. ?Baseline: HEP provided at eval ?Goal status: INITIAL ?  ?  ?LONG TERM GOALS: Target date: 12/18/2021 ?  ?Pt will achieve an ODI score of 22% or lower in order to demonstrate improved functional ability as it relates to her low back pain. ?Baseline: 42% ?Goal status: INITIAL ?  ?2.  Pt will achieve a 5xSTS of 12  seconds or less in order to promote safe transfers. ?Baseline: 25 seconds ?Goal status: INITIAL ?  ?3.  Pt will demonstrate a functional plank of 35 seconds or longer in order to promote prophylactic low back pain measures. ?Baseline: 13 seconds ?Goal status: INITIAL ?  ?4.  Pt will report ability to stand/ walk >30 minutes in order to grocery shop with less limitation. ?Baseline: Standing 5-10 minutes, walking 15 minutes ?Goal status: INITIAL ?  ?5.  Pt will achieve BIL global hip strength of 4+/5 or greater in order to progress to an advanced LE strengthening regimen with less limitation. ?Baseline: See MMT chart ?Goal status: INITIAL ?  ?  ?  ?PLAN: ?PT FREQUENCY: 1x/week ?  ?PT DURATION: 8 weeks ?  ?PLANNED INTERVENTIONS: Therapeutic exercises, Therapeutic activity, Neuromuscular re-education, Balance training, Gait training, Patient/Family education, Joint manipulation, Joint mobilization, Stair training, Aquatic Therapy, Dry Needling, Electrical stimulation, Spinal manipulation, Spinal mobilization, Cryotherapy, Moist heat, Taping, Vasopneumatic device, Traction, and Manual therapy. ?  ?PLAN FOR NEXT SESSION: Progress core/ hip strengthening; closed-chain exercises as  able ? ? ? ?Vanessa [place], PT, DPT ?11/05/21 12:58 PM ? ? ?  ? ?

## 2021-11-13 ENCOUNTER — Encounter: Payer: Self-pay | Admitting: Physical Therapy

## 2021-11-13 ENCOUNTER — Ambulatory Visit: Payer: Medicaid Other | Attending: Orthopedic Surgery | Admitting: Physical Therapy

## 2021-11-13 DIAGNOSIS — M25561 Pain in right knee: Secondary | ICD-10-CM | POA: Insufficient documentation

## 2021-11-13 DIAGNOSIS — M25551 Pain in right hip: Secondary | ICD-10-CM | POA: Insufficient documentation

## 2021-11-13 DIAGNOSIS — G8929 Other chronic pain: Secondary | ICD-10-CM | POA: Insufficient documentation

## 2021-11-13 DIAGNOSIS — M6281 Muscle weakness (generalized): Secondary | ICD-10-CM | POA: Diagnosis present

## 2021-11-13 DIAGNOSIS — R262 Difficulty in walking, not elsewhere classified: Secondary | ICD-10-CM | POA: Diagnosis present

## 2021-11-13 DIAGNOSIS — M25562 Pain in left knee: Secondary | ICD-10-CM | POA: Insufficient documentation

## 2021-11-13 DIAGNOSIS — M545 Low back pain, unspecified: Secondary | ICD-10-CM | POA: Diagnosis present

## 2021-11-13 NOTE — Therapy (Signed)
?OUTPATIENT PHYSICAL THERAPY TREATMENT NOTE ? ? ?Patient Name: [facility] ?MRN: [id number] ?DOB:[birth year], 61 y.o., female ?Today's Date: 11/13/2021 ? ?PCP: Gwenyth Bender, MD ?REFERRING PROVIDER: Rhodia Albright, PA-C ? ? PT End of Session - 11/13/21 1216   ? ? Visit Number 3   ? Number of Visits 9   ? Date for PT Re-Evaluation 12/18/21   ? Authorization Type [facility]   ? Authorization Time Period 27VL   ? Authorization - Visit Number 3   ? Authorization - Number of Visits 27   ? PT Start Time 1215   ? PT Stop Time 1258   ? PT Time Calculation (min) 43 min   ? Activity Tolerance Patient tolerated treatment well   ? Behavior During Therapy [facility] for tasks assessed/performed   ? ?  ?  ? ?  ? ? ?Past Medical History:  ?Diagnosis Date  ? Diabetes mellitus without complication (HCC)   ? Hypertension   ? ?Past Surgical History:  ?Procedure Laterality Date  ? LEFT HEART CATH AND CORONARY ANGIOGRAPHY N/A 08/02/2017  ? Procedure: LEFT HEART CATH AND CORONARY ANGIOGRAPHY;  Surgeon: Rinaldo Cloud, MD;  Location: [facility] INVAS[facility] CV LAB;  Service: Cardiovascular;  Laterality: N/A;  ? ?Patient Active Problem List  ? Diagnosis Date Noted  ? Acute non Q wave MI (myocardial infarction), initial episode of care [facility]) 07/31/2017  ? ? ?REFERRING DIAG: M54.51 (ICD-10-CM) - Vertebrogenic low back pain ? ?THERAPY DIAG:  ?Chronic bilateral low back pain, unspecified whether sciatica present ? ?Muscle weakness (generalized) ? ?PERTINENT HISTORY: DMII, HTN, hx of MI ? ? ?SUBJECTIVE:  ? ?Pt reports she has been HEP compliant and that her back is feeling good. ? ?PAIN:  ?Are you having pain? Yes: NPRS scale: 0/10 ?Pain location: Midline low back ?Pain description: achy ?Aggravating factors: standing >5-10 minutes, walking >15 minutes, bending, and lifting ?Relieving factors: ibuprofen, lidocaine, muscle relaxers, heating pad, and rest ? ? ? ? ? ?OBJECTIVE:  ? *Unless otherwise noted, objective information collected  previously* ? ?DIAGNOSTIC FINDINGS:  ?None current related to primary complaints ?  ?PATIENT SURVEYS:  ?Modified Oswestry 21/50, 42%  ?  ?SCREENING FOR RED FLAGS: ?Bowel or bladder incontinence: No ?  ?Cauda equina syndrome: No ?  ?COGNITION: ?          Overall cognitive status: Within functional limits for tasks assessed               ?           ?SENSATION: ?WFL ?  ?MUSCLE LENGTH: ?Hamstrings: mild limitation BIL ?Thomas test: mild limitation BIL ?  ?POSTURE:  ?Decreased lumbar lordosis ?  ?PALPATION: ?TTP to Rt>Lt lumbar paraspinals/ QL; TTP to Rt greater trochanter ?  ?PASSIVE ACCESSORIES: ?Hypomobile and painful T12-L5 ?  ?LUMBAR ROM:  ?  ?Active  A/PROM  ?10/23/2021  ?Flexion WNL with segmental lowering  ?Extension 50%  ?Right lateral flexion WNL, p!  ?Left lateral flexion WNL  ?Right rotation WNL  ?Left rotation WNL  ? (Blank rows = not tested) ?  ?  ?LE MMT: ?  ?MMT Right ?10/23/2021 Left ?10/23/2021  ?Hip flexion 4/5 p! 4/5  ?Hip extension 4/5 4/5  ?Hip abduction 3+/5p! 4/5  ?Knee flexion 4/5 with audible crepitace 4/5 with audible crepitace  ?Knee extension 4/5 with audible crepitace 4/5 with audible crepitace  ? (Blank rows = not tested) ?  ?LUMBAR SPECIAL TESTS:  ?PLE: (-) ?SLR: (-) ?Ober's: (+) on Rt ?Hip scour: (-)  BIL ?FABER: (-) BIL ?FADDIR: (-) BIL ?  ?FUNCTIONAL TESTS:  ?5xSTS: 25 seconds with knee pain ?Squat: 75% limited ?SLS: 3 seconds BIL ?Plank: 13 seconds ?  ?GAIT: ?Distance walked: 31ft ?Assistive device utilized: None ?Level of assistance: Complete Independence ?Comments: Antalgic gait with decreased stance on Lt, decreased pelvic translation/ arm swing ?  ?  ?  ?TODAY'S TREATMENT  ? ?[facility] Adult PT Treatment:                                                DATE: 11/13/2021 ?Therapeutic Exercise: ? ?nu-step L5 21m while taking subjective and planning session with patient ?LTR - 20x ?Knee planks 3x40sec ?Bridge  - 3x10 ?Prone press-up 20x ?Prone swimmers 3x20 ?Lumbar ext row - 30# - 2x10 ?Standing  Pallof press with 7# cable 2x10 with 5sec hold BIL ? ?Manual Therapy: ?N/A ? ?[facility] Adult PT Treatment:                                                DATE: 11/05/2021 ?Therapeutic Exercise: ?Knee planks 3x30sec ?Prone press-up 3x30sec ?Prone swimmers 3x20 ?Standing abdominal press-down with 20# cable 3x10 ?Standing Pallof press with 7# cable 2x10 with 5sec hold BIL ?Mini-squat side step walkout with 7# waist attachment at [facility] x5 walkouts BIL ?Manual Therapy: ?N/A ?Neuromuscular re-ed: ?N/A ?Therapeutic Activity: ?N/A ?Modalities: ?N/A ?Self Care: ?N/A ? ?  ?HOME EXERCISE PROGRAM: ?Access Code: [email or web address] ?URL: [email or web address] ?Date: 10/23/2021 ?Prepared by: Carmelina Dane ?  ?Exercises ?Sidelying ITB Stretch off Table - 1 x daily - 7 x weekly - 1 sets - 2-min hold ?Sidelying Hip Abduction - 1 x daily - 7 x weekly - 2 sets - 10 reps - 3-sec hold ?Supine Bridge - 1 x daily - 7 x weekly - 3 sets - 10 reps - 3-sec hold ?Abdominal Isometric Hold - FEET OFF TABLE* - 1 x daily - 7 x weekly - 3 sets - 30sec hold ?  ?  ?ASSESSMENT: ?  ?CLINICAL IMPRESSION: ?Darlene Pugh is doing very well with therapy concerning her low back.  She fatigues with prescribed exercises but has no increase in pain.  Will continue to progress as able. ?  ?OBJECTIVE IMPAIRMENTS Abnormal gait, decreased balance, decreased endurance, decreased mobility, difficulty walking, decreased ROM, decreased strength, hypomobility, impaired flexibility, improper body mechanics, postural dysfunction, and pain.  ?  ?ACTIVITY LIMITATIONS cleaning, community activity, driving, meal prep, laundry, yard work, and shopping.  ?  ?PERSONAL FACTORS Time since onset of injury/illness/exacerbation and 2 comorbidities: HTN, DMII  are also affecting patient's functional outcome.  ?  ?  ?REHAB POTENTIAL: Fair due to multiple body parts and chronicity of sxs ?  ?CLINICAL DECISION MAKING: Evolving/moderate complexity ?  ?EVALUATION COMPLEXITY:  Moderate ?  ?  ?GOALS: ?Goals reviewed with patient? Yes ?  ?SHORT TERM GOALS: Target date: 11/20/2021 ?  ?Pt will report understanding and adherence to her HEP in order to promote independence in the management of her primary impairments. ?Baseline: HEP provided at eval ?Goal status: INITIAL ?  ?  ?LONG TERM GOALS: Target date: 12/18/2021 ?  ?Pt will achieve an ODI score of 22% or lower in order to demonstrate improved functional ability as it relates to her low  back pain. ?Baseline: 42% ?Goal status: INITIAL ?  ?2.  Pt will achieve a 5xSTS of 12 seconds or less in order to promote safe transfers. ?Baseline: 25 seconds ?Goal status: INITIAL ?  ?3.  Pt will demonstrate a functional plank of 35 seconds or longer in order to promote prophylactic low back pain measures. ?Baseline: 13 seconds ?Goal status: INITIAL ?  ?4.  Pt will report ability to stand/ walk >30 minutes in order to grocery shop with less limitation. ?Baseline: Standing 5-10 minutes, walking 15 minutes ?Goal status: INITIAL ?  ?5.  Pt will achieve BIL global hip strength of 4+/5 or greater in order to progress to an advanced LE strengthening regimen with less limitation. ?Baseline: See MMT chart ?Goal status: INITIAL ?  ?  ?  ?PLAN: ?PT FREQUENCY: 1x/week ?  ?PT DURATION: 8 weeks ?  ?PLANNED INTERVENTIONS: Therapeutic exercises, Therapeutic activity, Neuromuscular re-education, Balance training, Gait training, Patient/Family education, Joint manipulation, Joint mobilization, Stair training, Aquatic Therapy, Dry Needling, Electrical stimulation, Spinal manipulation, Spinal mobilization, Cryotherapy, Moist heat, Taping, Vasopneumatic device, Traction, and Manual therapy. ?  ?PLAN FOR NEXT SESSION: Progress core/ hip strengthening; closed-chain exercises as able ? ? ? ?Fredderick PhenixKarl E Shakila Mak PT ?11/13/21 12:58 PM ? ? ?  ? ?

## 2021-11-19 ENCOUNTER — Ambulatory Visit: Payer: Medicaid Other

## 2021-11-25 NOTE — Therapy (Signed)
?OUTPATIENT PHYSICAL THERAPY TREATMENT NOTE ? ? ?Patient Name: Darlene Pugh [facility] ?MRN: [id number] ?DOB:March 01, 1961, 61 y.o., female ?Today's Date: 11/26/2021 ? ?PCP: Gwenyth Bender, MD ?REFERRING PROVIDER: Rhodia Albright, PA-C ? ? PT End of Session - 11/26/21 1238   ? ? Visit Number 4   ? Number of Visits 9   ? Date for PT Re-Evaluation 12/18/21   ? Authorization Type [facility]   ? Authorization Time Period 27VL   ? Authorization - Visit Number 4   ? Authorization - Number of Visits 27   ? PT Start Time 1245   ? PT Stop Time 1327   ? PT Time Calculation (min) 42 min   ? Activity Tolerance Patient tolerated treatment well   ? Behavior During Therapy [facility] for tasks assessed/performed   ? ?  ?  ? ?  ? ? ? ?Past Medical History:  ?Diagnosis Date  ? Diabetes mellitus without complication (HCC)   ? Hypertension   ? ?Past Surgical History:  ?Procedure Laterality Date  ? LEFT HEART CATH AND CORONARY ANGIOGRAPHY N/A 08/02/2017  ? Procedure: LEFT HEART CATH AND CORONARY ANGIOGRAPHY;  Surgeon: Rinaldo Cloud, MD;  Location: [facility] INV[facility] CV LAB;  Service: Cardiovascular;  Laterality: N/A;  ? ?Patient Active Problem List  ? Diagnosis Date Noted  ? Acute non Q wave MI (myocardial infarction), initial episode of care [facility]) 07/31/2017  ? ? ?REFERRING DIAG: M54.51 (ICD-10-CM) - Vertebrogenic low back pain ? ?THERAPY DIAG:  ?Chronic bilateral low back pain, unspecified whether sciatica present ? ?Muscle weakness (generalized) ? ?Pain in right hip ? ?Chronic pain of left knee ? ?Chronic pain of right knee ? ?Difficulty in walking, not elsewhere classified ? ?PERTINENT HISTORY: DMII, HTN, hx of MI ? ? ?SUBJECTIVE:  ? ?Pt denies any pain today, adding that she has only had mild pain in her low back and Rt hip occasionally. She adds that she has been doing her HEP and states she can see improvement since starting PT.  ? ?PAIN:  ?Are you having pain? Yes: NPRS scale: 0/10 ?Pain location: Midline low back ?Pain description:  achy ?Aggravating factors: standing >5-10 minutes, walking >15 minutes, bending, and lifting ?Relieving factors: ibuprofen, lidocaine, muscle relaxers, heating pad, and rest ? ? ? ? ? ?OBJECTIVE:  ? *Unless otherwise noted, objective information collected previously* ? ?DIAGNOSTIC FINDINGS:  ?None current related to primary complaints ?  ?PATIENT SURVEYS:  ?Modified Oswestry 21/50, 42%  ?  ?SCREENING FOR RED FLAGS: ?Bowel or bladder incontinence: No ?  ?Cauda equina syndrome: No ?  ?COGNITION: ?          Overall cognitive status: Within functional limits for tasks assessed               ?           ?SENSATION: ?WFL ?  ?MUSCLE LENGTH: ?Hamstrings: mild limitation BIL ?Thomas test: mild limitation BIL ?  ?POSTURE:  ?Decreased lumbar lordosis ?  ?PALPATION: ?TTP to Rt>Lt lumbar paraspinals/ QL; TTP to Rt greater trochanter ?  ?PASSIVE ACCESSORIES: ?Hypomobile and painful T12-L5 ?  ?LUMBAR ROM:  ?  ?Active  A/PROM  ?10/23/2021  ?Flexion WNL with segmental lowering  ?Extension 50%  ?Right lateral flexion WNL, p!  ?Left lateral flexion WNL  ?Right rotation WNL  ?Left rotation WNL  ? (Blank rows = not tested) ?  ?  ?LE MMT: ?  ?MMT Right ?10/23/2021 Left ?10/23/2021  ?Hip flexion 4/5 p! 4/5  ?Hip extension 4/5 4/5  ?  Hip abduction 3+/5p! 4/5  ?Knee flexion 4/5 with audible crepitace 4/5 with audible crepitace  ?Knee extension 4/5 with audible crepitace 4/5 with audible crepitace  ? (Blank rows = not tested) ?  ?LUMBAR SPECIAL TESTS:  ?PLE: (-) ?SLR: (-) ?Ober's: (+) on Rt ?Hip scour: (-) BIL ?FABER: (-) BIL ?FADDIR: (-) BIL ?  ?FUNCTIONAL TESTS:  ?5xSTS: 25 seconds with knee pain ?Squat: 75% limited ?SLS: 3 seconds BIL ?Plank: 13 seconds ?  ?GAIT: ?Distance walked: 56ft ?Assistive device utilized: None ?Level of assistance: Complete Independence ?Comments: Antalgic gait with decreased stance on Lt, decreased pelvic translation/ arm swing ?  ?  ?  ?TODAY'S TREATMENT  ? ?[facility] Adult PT Treatment:                                                 DATE: 11/26/2021 ?Therapeutic Exercise: ?Side knee plank with clams 2x10 BIL ?Reverse crunches 3x12 ?Plank x3 to failure ?Cobra stretch x1 min ?Swimmers 3x20 ?DKTC x1 min ?45# hex bar dead lift 3x8 ?Manual Therapy: ?N/A ?Neuromuscular re-ed: ?N/A ?Therapeutic Activity: ?N/A ?Modalities: ?N/A ?Self Care: ?N/A ? ? ?[facility] Adult PT Treatment:                                                DATE: 11/13/2021 ?Therapeutic Exercise: ? ?nu-step L5 13m while taking subjective and planning session with patient ?LTR - 20x ?Knee planks 3x40sec ?Bridge  - 3x10 ?Prone press-up 20x ?Prone swimmers 3x20 ?Lumbar ext row - 30# - 2x10 ?Standing Pallof press with 7# cable 2x10 with 5sec hold BIL ? ?Manual Therapy: ?N/A ? ?[facility] Adult PT Treatment:                                                DATE: 11/05/2021 ?Therapeutic Exercise: ?Knee planks 3x30sec ?Prone press-up 3x30sec ?Prone swimmers 3x20 ?Standing abdominal press-down with 20# cable 3x10 ?Standing Pallof press with 7# cable 2x10 with 5sec hold BIL ?Mini-squat side step walkout with 7# waist attachment at [facility] x5 walkouts BIL ?Manual Therapy: ?N/A ?Neuromuscular re-ed: ?N/A ?Therapeutic Activity: ?N/A ?Modalities: ?N/A ?Self Care: ?N/A ? ?  ?HOME EXERCISE PROGRAM: ?Access Code: [email or web address] ?URL: [email or web address] ?Date: 10/23/2021 ?Prepared by: Carmelina Dane ?  ?Exercises ?Sidelying ITB Stretch off Table - 1 x daily - 7 x weekly - 1 sets - 2-min hold ?Sidelying Hip Abduction - 1 x daily - 7 x weekly - 2 sets - 10 reps - 3-sec hold ?Supine Bridge - 1 x daily - 7 x weekly - 3 sets - 10 reps - 3-sec hold ?Abdominal Isometric Hold - FEET OFF TABLE* - 1 x daily - 7 x weekly - 3 sets - 30sec hold ?  ?  ?ASSESSMENT: ?  ?CLINICAL IMPRESSION: ?Pt responded well to all interventions today, demonstrating good form and no pain with progressed core and hip strengthening today. She will continue to benefit from skilled PT to address her primary impairments and  return to her prior level of function with less limitation. ?  ?OBJECTIVE IMPAIRMENTS Abnormal gait, decreased balance, decreased endurance, decreased mobility, difficulty walking, decreased  ROM, decreased strength, hypomobility, impaired flexibility, improper body mechanics, postural dysfunction, and pain.  ?  ?ACTIVITY LIMITATIONS cleaning, community activity, driving, meal prep, laundry, yard work, and shopping.  ?  ?PERSONAL FACTORS Time since onset of injury/illness/exacerbation and 2 comorbidities: HTN, DMII  are also affecting patient's functional outcome.  ?  ?  ?REHAB POTENTIAL: Fair due to multiple body parts and chronicity of sxs ?  ?CLINICAL DECISION MAKING: Evolving/moderate complexity ?  ?EVALUATION COMPLEXITY: Moderate ?  ?  ?GOALS: ?Goals reviewed with patient? Yes ?  ?SHORT TERM GOALS: Target date: 11/20/2021 ?  ?Pt will report understanding and adherence to her HEP in order to promote independence in the management of her primary impairments. ?Baseline: HEP provided at eval ?Goal status: INITIAL ?  ?  ?LONG TERM GOALS: Target date: 12/18/2021 ?  ?Pt will achieve an ODI score of 22% or lower in order to demonstrate improved functional ability as it relates to her low back pain. ?Baseline: 42% ?Goal status: INITIAL ?  ?2.  Pt will achieve a 5xSTS of 12 seconds or less in order to promote safe transfers. ?Baseline: 25 seconds ?Goal status: INITIAL ?  ?3.  Pt will demonstrate a functional plank of 35 seconds or longer in order to promote prophylactic low back pain measures. ?Baseline: 13 seconds ?Goal status: INITIAL ?  ?4.  Pt will report ability to stand/ walk >30 minutes in order to grocery shop with less limitation. ?Baseline: Standing 5-10 minutes, walking 15 minutes ?Goal status: INITIAL ?  ?5.  Pt will achieve BIL global hip strength of 4+/5 or greater in order to progress to an advanced LE strengthening regimen with less limitation. ?Baseline: See MMT chart ?Goal status: INITIAL ?  ?  ?   ?PLAN: ?PT FREQUENCY: 1x/week ?  ?PT DURATION: 8 weeks ?  ?PLANNED INTERVENTIONS: Therapeutic exercises, Therapeutic activity, Neuromuscular re-education, Balance training, Gait training, Patient/Family education, Joint mani

## 2021-11-26 ENCOUNTER — Ambulatory Visit: Payer: Medicaid Other

## 2021-11-26 DIAGNOSIS — M545 Low back pain, unspecified: Secondary | ICD-10-CM

## 2021-11-26 DIAGNOSIS — G8929 Other chronic pain: Secondary | ICD-10-CM

## 2021-11-26 DIAGNOSIS — R262 Difficulty in walking, not elsewhere classified: Secondary | ICD-10-CM

## 2021-11-26 DIAGNOSIS — M25551 Pain in right hip: Secondary | ICD-10-CM

## 2021-11-26 DIAGNOSIS — M6281 Muscle weakness (generalized): Secondary | ICD-10-CM

## 2021-12-10 NOTE — Therapy (Addendum)
OUTPATIENT PHYSICAL THERAPY TREATMENT NOTE/ DISCHARGE SUMMARY   Patient Name: Darlene Pugh MRN: [id number] DOB:1960/08/29, 61 y.o., female Today's Date: 12/11/2021  PCP: Rogers Blocker, MD REFERRING PROVIDER: Rogers Blocker, MD   PT End of Session - 12/11/21 1255     Visit Number 5    Number of Visits 9    Date for PT Re-Evaluation 12/18/21    Authorization Type [facility]    Authorization Time Period 27VL    Authorization - Visit Number 5    Authorization - Number of Visits 27    PT Start Time 1300    PT Stop Time 1343    PT Time Calculation (min) 43 min    Activity Tolerance Patient tolerated treatment well    Behavior During Therapy WFL for tasks assessed/performed               Past Medical History:  Diagnosis Date   Diabetes mellitus without complication ([place])    Hypertension    Past Surgical History:  Procedure Laterality Date   LEFT HEART CATH AND CORONARY ANGIOGRAPHY N/A 08/02/2017   Procedure: LEFT HEART CATH AND CORONARY ANGIOGRAPHY;  Surgeon: Charolette Forward, MD;  Location: [place] [facility];  Service: Cardiovascular;  Laterality: N/A;   Patient Active Problem List   Diagnosis Date Noted   Acute non Q wave MI (myocardial infarction), initial episode of care ([place]) 07/31/2017    REFERRING DIAG: M54.51 (ICD-10-CM) - Vertebrogenic low back pain  THERAPY DIAG:  Chronic bilateral low back pain, unspecified whether sciatica present  Muscle weakness (generalized)  Pain in right hip  Chronic pain of left knee  Chronic pain of right knee  Difficulty in walking, not elsewhere classified  PERTINENT HISTORY: DMII, HTN, hx of MI   SUBJECTIVE:   Pt reports a pattern of improvement in her LBP, although her Rt knee pain has been bothering her more recently.   PAIN:  Are you having pain? Yes: NPRS scale: 7/10 Pain location: Rt lateral knee Pain description: achy Aggravating factors: standing >5-10 minutes, walking >15 minutes, bending, and  lifting Relieving factors: ibuprofen, lidocaine, muscle relaxers, heating pad, and rest      OBJECTIVE:   *Unless otherwise noted, objective information collected previously*  DIAGNOSTIC FINDINGS:  None current related to primary complaints   PATIENT SURVEYS:  Modified Oswestry 21/50, 42%    SCREENING FOR RED FLAGS: Bowel or bladder incontinence: No   Cauda equina syndrome: No   COGNITION:           Overall cognitive status: Within functional limits for tasks assessed                          SENSATION: WFL   MUSCLE LENGTH: Hamstrings: mild limitation BIL Thomas test: mild limitation BIL   POSTURE:  Decreased lumbar lordosis   PALPATION: TTP to Rt>Lt lumbar paraspinals/ QL; TTP to Rt greater trochanter   PASSIVE ACCESSORIES: Hypomobile and painful T12-L5   LUMBAR ROM:    Active  A/PROM  10/23/2021  Flexion WNL with segmental lowering  Extension 50%  Right lateral flexion WNL, p!  Left lateral flexion WNL  Right rotation WNL  Left rotation WNL   (Blank rows = not tested)     LE MMT:   MMT Right 10/23/2021 Left 10/23/2021  Hip flexion 4/5 p! 4/5  Hip extension 4/5 4/5  Hip abduction 3+/5p! 4/5  Knee flexion 4/5 with audible crepitace 4/5 with audible crepitace  Knee extension 4/5 with audible crepitace 4/5 with audible crepitace   (Blank rows = not tested)   LUMBAR SPECIAL TESTS:  PLE: (-) SLR: (-) Ober's: (+) on Rt Hip scour: (-) BIL FABER: (-) BIL FADDIR: (-) BIL   FUNCTIONAL TESTS:  5xSTS: 25 seconds with knee pain Squat: 75% limited SLS: 3 seconds BIL Plank: 13 seconds   GAIT: Distance walked: 30f Assistive device utilized: None Level of assistance: Complete Independence Comments: Antalgic gait with decreased stance on Lt, decreased pelvic translation/ arm swing       TODAY'S TREATMENT   [facility] Adult PT Treatment:                                                DATE: 12/11/2021 Therapeutic Exercise: Supine bicycles 3x10 Sidelying  hip abduction with YTB 210 with 3-sec hold BIL Seated sciatic nerve glides x20 BIL Prone swimmers with YTB around ankles 3x12 Standing abdominal press-down with 23# 3x10 Standing trunk side bend with 23# cable 2x10 BIL Manual Therapy: N/A Neuromuscular re-ed: N/A Therapeutic Activity: N/A Modalities: N/A Self Care: N/A   [facility] Adult PT Treatment:                                                DATE: 11/26/2021 Therapeutic Exercise: Side knee plank with clams 2x10 BIL Reverse crunches 3x12 Plank x3 to failure Cobra stretch x1 min Swimmers 3x20 DKTC x1 min 45# hex bar dead lift 3x8 Manual Therapy: N/A Neuromuscular re-ed: N/A Therapeutic Activity: N/A Modalities: N/A Self Care: N/A   [facility] Adult PT Treatment:                                                DATE: 11/13/2021 Therapeutic Exercise:  nu-step L5 564mhile taking subjective and planning session with patient LTR - 20x Knee planks 3x40sec Bridge  - 3x10 Prone press-up 20x Prone swimmers 3x20 Lumbar ext row - 30# - 2x10 Standing Pallof press with 7# cable 2x10 with 5sec hold BIL Manual Therapy: N/A     HOME EXERCISE PROGRAM: Access Code: [email or web address] URL: [email or web address] Date: 10/23/2021 Prepared by: TuVanessa Pataskala Exercises Sidelying ITB Stretch off Table - 1 x daily - 7 x weekly - 1 sets - 2-min hold Sidelying Hip Abduction - 1 x daily - 7 x weekly - 2 sets - 10 reps - 3-sec hold Supine Bridge - 1 x daily - 7 x weekly - 3 sets - 10 reps - 3-sec hold Abdominal Isometric Hold - FEET OFF TABLE* - 1 x daily - 7 x weekly - 3 sets - 30sec hold     ASSESSMENT:   CLINICAL IMPRESSION: Pt responded well to all interventions today, although she was limited by fatigue and Rt knee pain throughout the session. Exercises were regressed to account for these limitations, and the pt was able to complete all interventions. She will continue to benefit from skilled PT to address her primary  impairments and return to her prior level of function with less limitation.   OBJECTIVE IMPAIRMENTS Abnormal gait, decreased balance, decreased endurance, decreased mobility,  difficulty walking, decreased ROM, decreased strength, hypomobility, impaired flexibility, improper body mechanics, postural dysfunction, and pain.    ACTIVITY LIMITATIONS cleaning, community activity, driving, meal prep, laundry, yard work, and shopping.    PERSONAL FACTORS Time since onset of injury/illness/exacerbation and 2 comorbidities: HTN, DMII  are also affecting patient's functional outcome.      REHAB POTENTIAL: Fair due to multiple body parts and chronicity of sxs   CLINICAL DECISION MAKING: Evolving/moderate complexity   EVALUATION COMPLEXITY: Moderate     GOALS: Goals reviewed with patient? Yes   SHORT TERM GOALS: Target date: 11/20/2021   Pt will report understanding and adherence to her HEP in order to promote independence in the management of her primary impairments. Baseline: HEP provided at eval Goal status: INITIAL     LONG TERM GOALS: Target date: 12/18/2021   Pt will achieve an ODI score of 22% or lower in order to demonstrate improved functional ability as it relates to her low back pain. Baseline: 42% Goal status: INITIAL   2.  Pt will achieve a 5xSTS of 12 seconds or less in order to promote safe transfers. Baseline: 25 seconds Goal status: INITIAL   3.  Pt will demonstrate a functional plank of 35 seconds or longer in order to promote prophylactic low back pain measures. Baseline: 13 seconds Goal status: INITIAL   4.  Pt will report ability to stand/ walk >30 minutes in order to grocery shop with less limitation. Baseline: Standing 5-10 minutes, walking 15 minutes Goal status: INITIAL   5.  Pt will achieve BIL global hip strength of 4+/5 or greater in order to progress to an advanced LE strengthening regimen with less limitation. Baseline: See MMT chart Goal status: INITIAL        PLAN: PT FREQUENCY: 1x/week   PT DURATION: 8 weeks   PLANNED INTERVENTIONS: Therapeutic exercises, Therapeutic activity, Neuromuscular re-education, Balance training, Gait training, Patient/Family education, Joint manipulation, Joint mobilization, Stair training, Aquatic Therapy, Dry Needling, Electrical stimulation, Spinal manipulation, Spinal mobilization, Cryotherapy, Moist heat, Taping, Vasopneumatic device, Traction, and Manual therapy.   PLAN FOR NEXT SESSION: Progress core/ hip strengthening; closed-chain exercises as able    Cherie Ouch PT 12/11/21 1:48 PM    PHYSICAL THERAPY DISCHARGE SUMMARY  Visits from Start of Care: 5  Current functional level related to goals / functional outcomes: Unable to assess   Remaining deficits: Unable to assess   Education / Equipment: HEP   Patient agrees to discharge. Patient goals were not met. Patient is being discharged due to not returning since the last visit.  Vanessa [place], PT, DPT 01/22/22 10:19 AM

## 2021-12-11 ENCOUNTER — Ambulatory Visit: Payer: Medicaid Other | Attending: Orthopedic Surgery

## 2021-12-11 DIAGNOSIS — G8929 Other chronic pain: Secondary | ICD-10-CM | POA: Insufficient documentation

## 2021-12-11 DIAGNOSIS — M25551 Pain in right hip: Secondary | ICD-10-CM | POA: Insufficient documentation

## 2021-12-11 DIAGNOSIS — M25562 Pain in left knee: Secondary | ICD-10-CM | POA: Insufficient documentation

## 2021-12-11 DIAGNOSIS — M6281 Muscle weakness (generalized): Secondary | ICD-10-CM | POA: Diagnosis present

## 2021-12-11 DIAGNOSIS — M25561 Pain in right knee: Secondary | ICD-10-CM | POA: Insufficient documentation

## 2021-12-11 DIAGNOSIS — M545 Low back pain, unspecified: Secondary | ICD-10-CM | POA: Diagnosis present

## 2021-12-11 DIAGNOSIS — R262 Difficulty in walking, not elsewhere classified: Secondary | ICD-10-CM | POA: Insufficient documentation

## 2021-12-19 ENCOUNTER — Ambulatory Visit: Payer: Medicaid Other | Admitting: Physical Therapy

## 2022-01-14 NOTE — Progress Notes (Deleted)
Office Visit Note  Patient: Darlene Pugh             Date of Birth: February 17, 1961           MRN: [id number]             PCP: Rogers Blocker, MD Referring: Joycelyn Man, FNP Visit Date: 01/28/2022 Occupation: _0 @  Subjective:  No chief complaint on file.   History of Present Illness: Darlene Pugh is a 61 y.o. female ***   Activities of Daily Living:  Patient reports morning stiffness for *** {minute/hour:19697}.   Patient {ACTIONS;DENIES/REPORTS:21021675::"Denies"} nocturnal pain.  Difficulty dressing/grooming: {ACTIONS;DENIES/REPORTS:21021675::"Denies"} Difficulty climbing stairs: {ACTIONS;DENIES/REPORTS:21021675::"Denies"} Difficulty getting out of chair: {ACTIONS;DENIES/REPORTS:21021675::"Denies"} Difficulty using hands for taps, buttons, cutlery, and/or writing: {ACTIONS;DENIES/REPORTS:21021675::"Denies"}  No Rheumatology ROS completed.   PMFS History:  Patient Active Problem List   Diagnosis Date Noted   Acute non Q wave MI (myocardial infarction), initial episode of care ([place]) 07/31/2017    Past Medical History:  Diagnosis Date   Diabetes mellitus without complication ([place])    Hypertension     No family history on file. Past Surgical History:  Procedure Laterality Date   LEFT HEART CATH AND CORONARY ANGIOGRAPHY N/A 08/02/2017   Procedure: LEFT HEART CATH AND CORONARY ANGIOGRAPHY;  Surgeon: Charolette Forward, MD;  Location: [place] [facility];  Service: Cardiovascular;  Laterality: N/A;   Social History   Social History Narrative   Not on file    There is no immunization history on file for this patient.   Objective: Vital Signs: There were no vitals taken for this visit.   Physical Exam   Musculoskeletal Exam: ***  CDAI Exam: CDAI Score: -- Patient Global: --; Provider Global: -- Swollen: --; Tender: -- Joint Exam 01/28/2022   No joint exam has been documented for this visit   There is currently no information documented on  the homunculus. Go to the Rheumatology activity and complete the homunculus joint exam.  Investigation: No additional findings.  Imaging: No results found.  Recent Labs: Lab Results  Component Value Date   WBC 7.8 08/03/2017   HGB 12.5 08/03/2017   PLT 250 08/03/2017   NA 139 08/03/2017   K 3.7 08/03/2017   CL 107 08/03/2017   CO2 24 08/03/2017   GLUCOSE 123 (H) 08/03/2017   BUN 6 08/03/2017   CREATININE 0.73 08/03/2017   BILITOT 1.7 (H) 07/31/2017   ALKPHOS 82 07/31/2017   AST 37 07/31/2017   ALT 13 (L) 07/31/2017   PROT 6.8 07/31/2017   ALBUMIN 3.6 07/31/2017   CALCIUM 9.1 08/03/2017   GFRAA >60 08/03/2017    Speciality Comments: No specialty comments available.  Procedures:  No procedures performed Allergies: Patient has no known allergies.   Assessment / Plan:     Visit Diagnoses: Polyarthralgia  Positive ANA (antinuclear antibody) - 08/20/21:ANA+, RF<10, anti-CCP 3, ESR 9, uric acid 6.6  Chronic pain of both knees  Bilateral hip pain  Vitamin D deficiency  History of hyperlipidemia  History of type 2 diabetes mellitus  History of cataract  Essential hypertension  Other insomnia  Orders: No orders of the defined types were placed in this encounter.  No orders of the defined types were placed in this encounter.   Face-to-face time spent with patient was *** minutes. Greater than 50% of time was spent in counseling and coordination of care.  Follow-Up Instructions: No follow-ups on file.   Ofilia Neas, PA-C  Note - This record  has been created using [employer].  Chart creation errors have been sought, but may not always  have been located. Such creation errors do not reflect on  the standard of medical care.

## 2022-01-28 ENCOUNTER — Ambulatory Visit: Payer: Medicaid Other | Admitting: Rheumatology

## 2022-01-28 DIAGNOSIS — Z8639 Personal history of other endocrine, nutritional and metabolic disease: Secondary | ICD-10-CM

## 2022-01-28 DIAGNOSIS — M2550 Pain in unspecified joint: Secondary | ICD-10-CM

## 2022-01-28 DIAGNOSIS — I214 Non-ST elevation (NSTEMI) myocardial infarction: Secondary | ICD-10-CM

## 2022-01-28 DIAGNOSIS — I10 Essential (primary) hypertension: Secondary | ICD-10-CM

## 2022-01-28 DIAGNOSIS — M25551 Pain in right hip: Secondary | ICD-10-CM

## 2022-01-28 DIAGNOSIS — G4709 Other insomnia: Secondary | ICD-10-CM

## 2022-01-28 DIAGNOSIS — R768 Other specified abnormal immunological findings in serum: Secondary | ICD-10-CM

## 2022-01-28 DIAGNOSIS — G8929 Other chronic pain: Secondary | ICD-10-CM

## 2022-01-28 DIAGNOSIS — E559 Vitamin D deficiency, unspecified: Secondary | ICD-10-CM

## 2022-01-28 DIAGNOSIS — Z8669 Personal history of other diseases of the nervous system and sense organs: Secondary | ICD-10-CM

## 2022-02-05 ENCOUNTER — Other Ambulatory Visit: Payer: Self-pay | Admitting: Family Medicine

## 2022-02-05 DIAGNOSIS — Z1231 Encounter for screening mammogram for malignant neoplasm of breast: Secondary | ICD-10-CM

## 2022-02-27 ENCOUNTER — Ambulatory Visit
Admission: RE | Admit: 2022-02-27 | Discharge: 2022-02-27 | Disposition: A | Discharge status: Home / Self Care | Payer: Medicaid Other | Source: Ambulatory Visit | Attending: Family Medicine | Admitting: Family Medicine

## 2022-02-27 DIAGNOSIS — Z1231 Encounter for screening mammogram for malignant neoplasm of breast: Secondary | ICD-10-CM

## 2022-10-03 IMAGING — US US BREAST*L* LIMITED INC AXILLA
1 series · 12 of 12 positions shown · non-contrast
Comparison: Previous exams including LEFT breast ultrasound dated
01/14/2021.

CLINICAL DATA: History of phlegmonous changes within the skin of
the LEFT breast with draining wound/infection. Patient has completed
a course of antibiotics since the previous diagnostic exam. Patient
returns today for follow-up LEFT breast ultrasound to exclude
abscess development.

EXAM:
ULTRASOUND OF THE LEFT BREAST

[Series 1: us breast*left* limited inc axilla · 0.05mm/px · 12 of 12 slices shown]
[im 1/12]
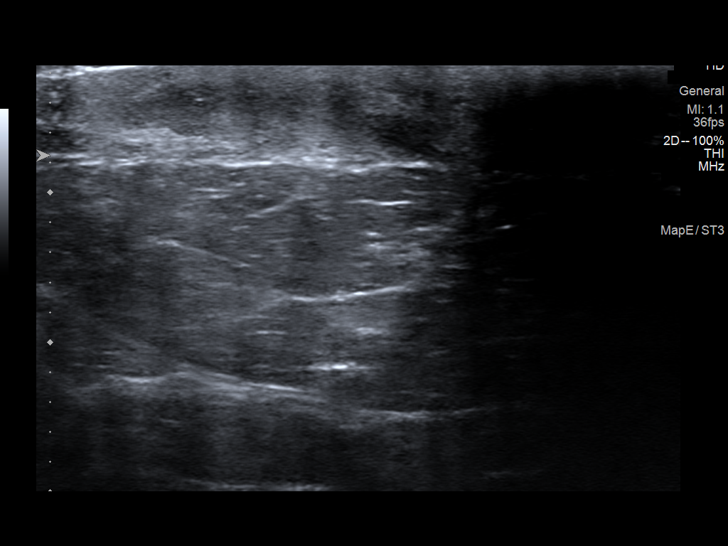
[im 2/12]
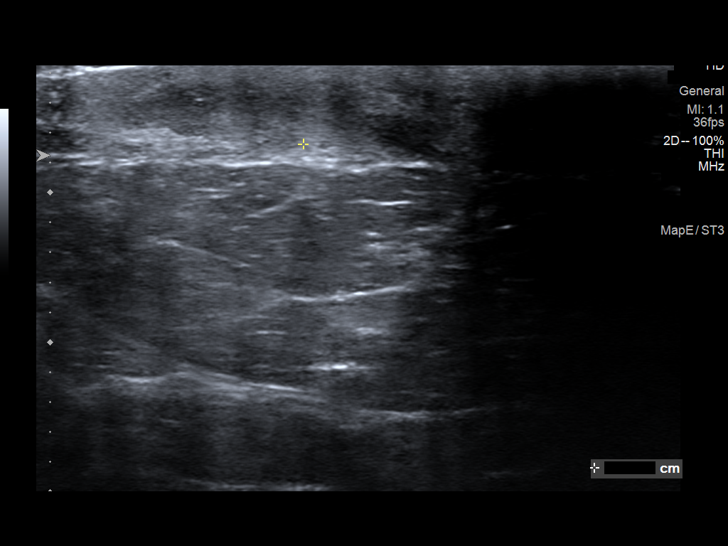
[im 3/12]
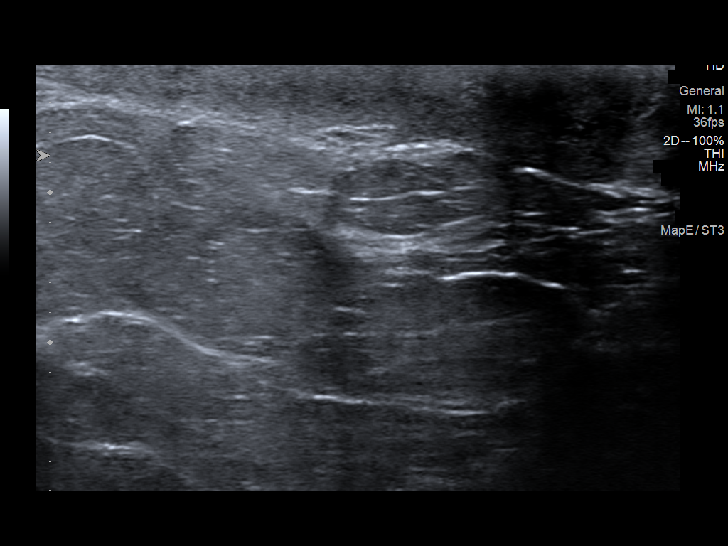
[im 4/12]
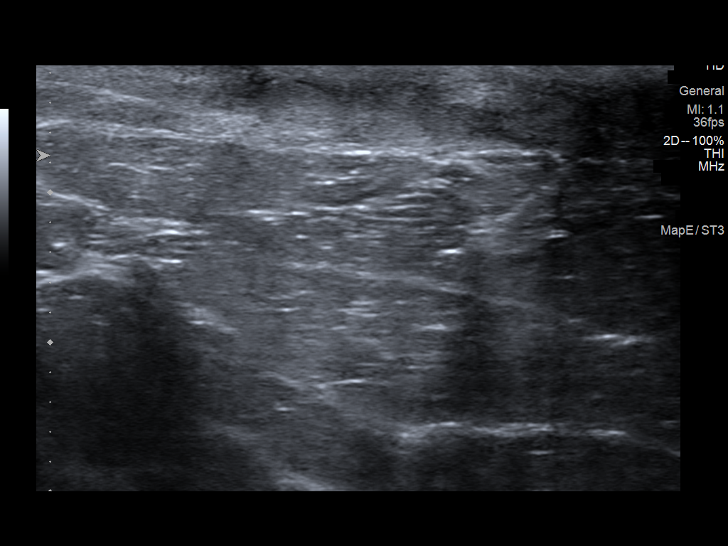
[im 5/12]
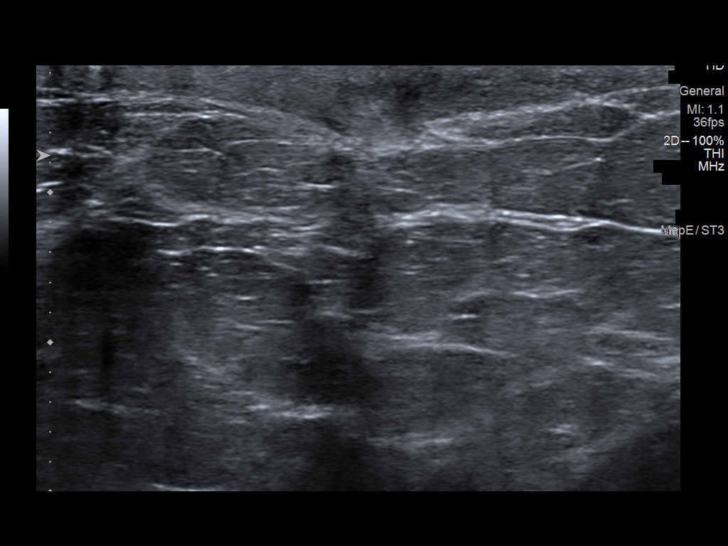
[im 6/12]
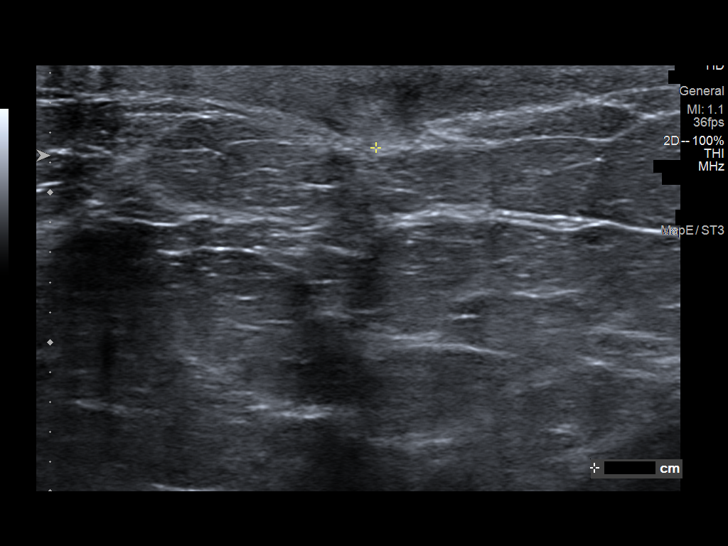
[im 7/12]
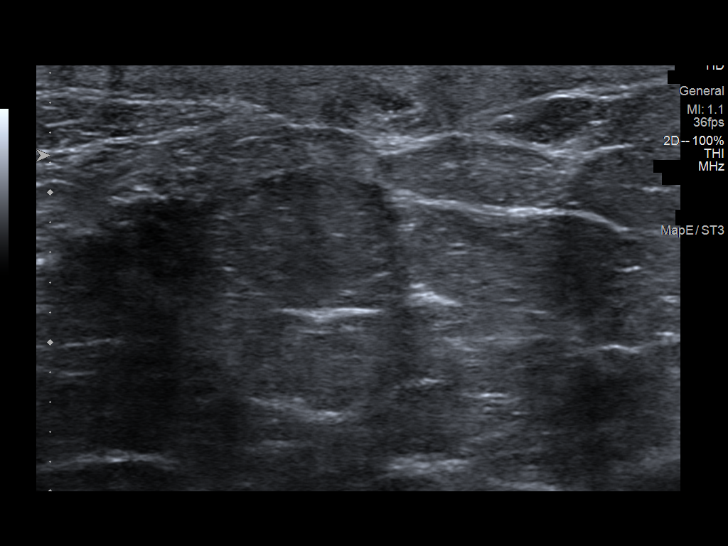
[im 8/12]
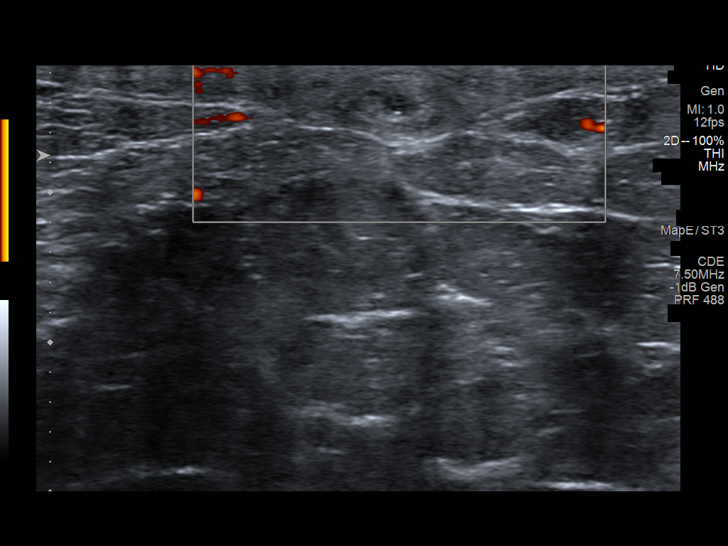
[im 9/12]
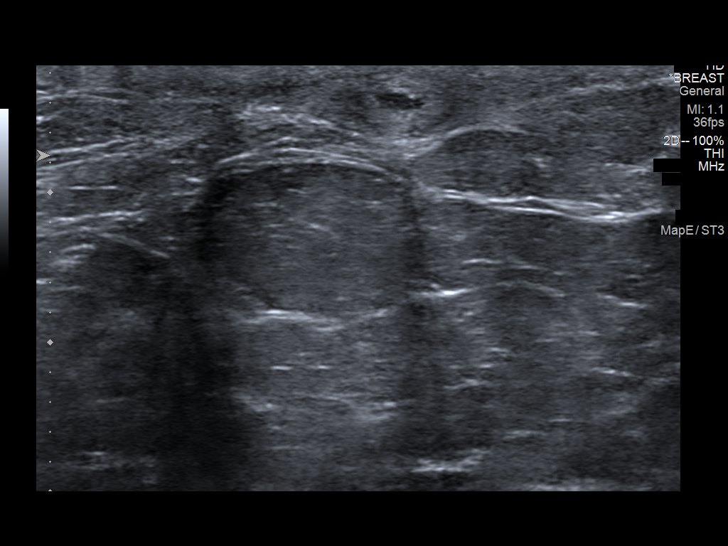
[im 10/12]
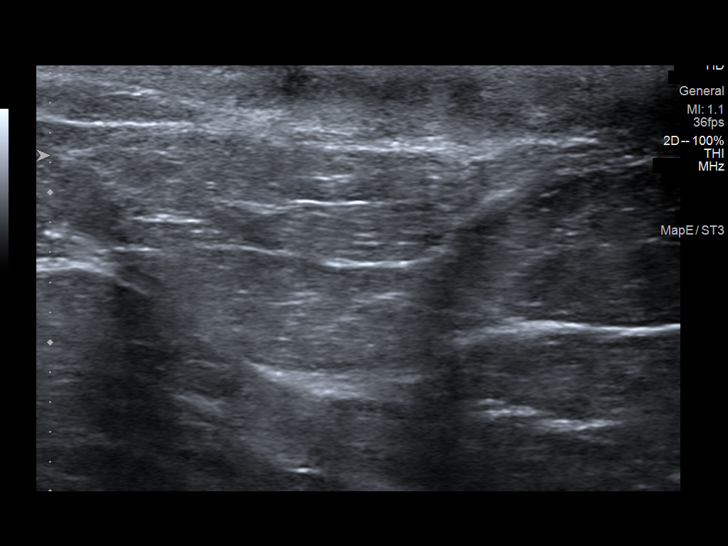
[im 11/12]
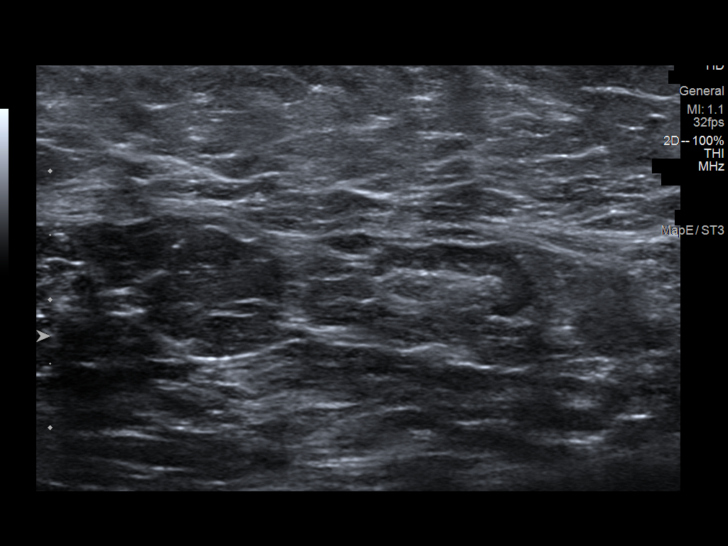
[im 12/12]
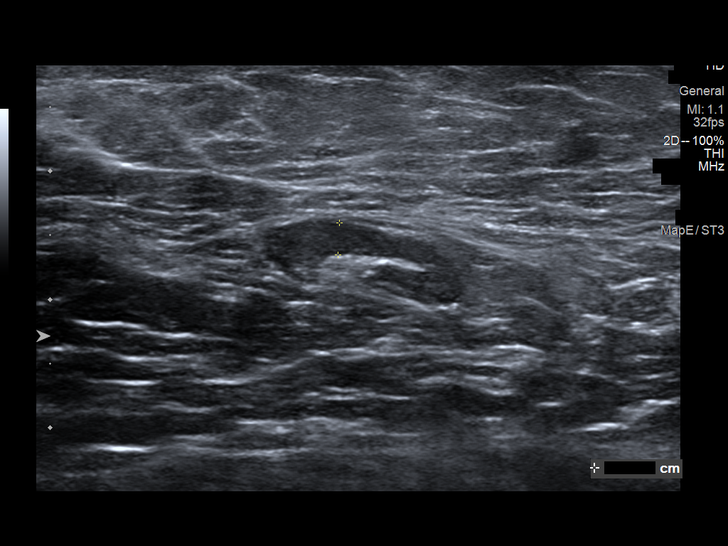

[12 of 12 positions shown; findings below may reference images not displayed]

FINDINGS: Targeted ultrasound is performed, evaluating the medial periareolar
LEFT breast, showing slight improvement of the skin thickening at
the 8 o'clock axis and of the associated phlegmonous/edematous
changes within the skin. No drainable fluid collection or
abscess-like collection is seen within the skin or within the
underlying breast tissues.

Patient states that the palpable findings have decreased and that
there is no persistent drainage.
IMPRESSION: Resolving sebaceous cyst versus focal cellulitis involving the skin
of the medial periareolar LEFT breast, improved status post a course
of antibiotics. No drainable fluid collection or abscess-like
collection is now seen within the skin or within the underlying
breast tissues. No further follow-up imaging is necessary for this
benign finding. Recommend clinical follow-up only.

RECOMMENDATION:
1. Clinical follow-up for the resolving sebaceous cyst versus focal
cellulitis involving the skin of the medial periareolar LEFT breast.
Patient was encouraged to follow-up with referring physician if any
recurrent skin redness, swelling or drainage was noted at this site.
2. Screening mammogram in one year.(Code:[id number])

I have discussed the findings and recommendations with the patient.
If applicable, a reminder letter will be sent to the patient
regarding the next appointment.

BI-RADS CATEGORY  2: Benign.

## 2022-10-16 ENCOUNTER — Other Ambulatory Visit: Payer: Self-pay | Admitting: Family Medicine

## 2022-10-16 DIAGNOSIS — Z13820 Encounter for screening for osteoporosis: Secondary | ICD-10-CM

## 2023-02-17 ENCOUNTER — Other Ambulatory Visit: Payer: Self-pay | Admitting: Family Medicine

## 2023-02-17 DIAGNOSIS — Z1231 Encounter for screening mammogram for malignant neoplasm of breast: Secondary | ICD-10-CM

## 2023-04-07 ENCOUNTER — Ambulatory Visit
Admission: RE | Admit: 2023-04-07 | Discharge: 2023-04-07 | Disposition: A | Discharge status: Home / Self Care | Payer: Medicaid Other | Source: Ambulatory Visit | Attending: Family Medicine | Admitting: Family Medicine

## 2023-04-07 DIAGNOSIS — Z13820 Encounter for screening for osteoporosis: Secondary | ICD-10-CM

## 2023-04-07 DIAGNOSIS — Z1231 Encounter for screening mammogram for malignant neoplasm of breast: Secondary | ICD-10-CM

## 2023-07-28 ENCOUNTER — Ambulatory Visit
Admission: RE | Admit: 2023-07-28 | Discharge: 2023-07-28 | Disposition: A | Discharge status: Home / Self Care | Payer: Medicaid Other | Source: Ambulatory Visit | Attending: Family Medicine | Admitting: Family Medicine

## 2023-07-28 ENCOUNTER — Other Ambulatory Visit: Payer: Self-pay | Admitting: Family Medicine

## 2023-07-28 DIAGNOSIS — M25532 Pain in left wrist: Secondary | ICD-10-CM

## 2023-07-28 DIAGNOSIS — M79642 Pain in left hand: Secondary | ICD-10-CM

## 2024-01-31 NOTE — Progress Notes (Signed)
 Chief Complaint  Patient presents with  . Complete Physical Exam    Room 1 pt here today for a cpe, pt states she is doing well, no other complaints    HPI Subjective Patient ID: Darlene Pugh is a 63 year old female who presents for Complete Physical Exam (Room 1 pt here today for a cpe, pt states she is doing well, no other complaints). Patient with medical conditions including hypertension, diabetes, chronic insomnia, obesity, and joint pain presents to clinic for annual physical exam.  Patient states she has been doing well since her last visit.  She states she is taking her medications as prescribed but her blood pressure is elevated at 170/101 and mildly improved to 152/96 when rechecked.  Patient states she is taking blood pressure medications as prescribed.  She denies chest pain, dizziness, one-sided weakness, syncope, dyspnea, orthopnea, or lower extremity edema.  Patient states her mammogram is scheduled for August 2025.  She states she had colonoscopy in the past, but unsure which specialist completed her procedure.  Medical records are not available for review at this time.  Patient declined lab work to be drawn in office today.  She states she will go to [facility] to have lab work completed.  Her PHQ-9 score is and GAD-7 score is.  She denies suicidal or homicidal ideations.  Patient denies other acute concerns at this time.  Patient's last mammogram labs completed at the breast center on 04/07/2023.she has plans plans to repeat mammogram in 1 year around April 06, 2024.   Health Maintenance Due  Topic Date Due  . Imm-DTaP/Tdap/Td (1 - Tdap) Never done  . Imm-Pneumococcal 50+ (1 of 2 - PCV) Never done  . Imm-Zoster, Recombinant (1 of 2) Never done  . Imm-COVID-19 (1 - 2024-25 season) Never done   Past Medical History:  Diagnosis Date  . High blood pressure    Past Surgical History:  Procedure Laterality Date  . ESWL FOR GALLSTONES     Current Outpatient Medications  Medication Sig  Dispense Refill  . metoprolol  succinate XL (TOPROL -XL) 100 mg 24 hr tablet Take 1 Tablet by mouth daily.    SABRA spironolactone (ALDACTONE) 25 mg tablet Take 1 Tablet by mouth once daily for 180 days. 90 Tablet 1  . albuterol  HFA 90 mcg/actuation inhaler INHALE 2 PUFFS INTO THE LUNGS EVERY 6 HOURS AS NEEDED FOR SHORTNESS OF BREATH OR WHEEZING 18 g 3  . metFORMIN  (GLUCOPHAGE ) 1,000 mg tablet TAKE 1 TABLET BY MOUTH TWICE DAILY WITH A MEAL 180 Tablet 3  . atorvastatin  (LIPITOR ) 80 mg tablet TAKE 1 TABLET BY MOUTH EVERY NIGHT AT BEDTIME 90 Tablet 3  . QUEtiapine (SEROQUEL XR) 50 mg Tb24 24 hr tablet Take 1 Tablet by mouth nightly at bedtime 90 Tablet 3  . blood sugar diagnostic strips 1 Each 3 (three) times daily before meals 100 Each 11  . blood-glucose meter monitoring kit as needed for blood glucose monitoring 1 Each 0  . lancets Use check blood glucose 3 times daily before meals 100 Each 11  . lidocaine  (LIDODERM ) 5 % patch apply 1 patch by transdermal route once daily (May wear up to 12hours.) 30 Patch 11  . blood pressure monitor Use to check blood pressure twice daily and records BP logs nd bring to your office visits 1 Kit 0  . ergocalciferol (VITAMIN D-2) 1,250 mcg (50,000 unit) capsule Take 1 Capsule by mouth once a week for 180 days take 1 capsule by oral route once Weekly  12 Capsule 1  . lisinopriL  40 mg tablet Take 1 Tablet by mouth daily. 90 Tablet 3  . meloxicam  (MOBIC ) 7.5 mg tablet Take 1 Tablet by mouth once daily for 90 days 90 Tablet 0  . tiZANidine (ZANAFLEX) 2 mg tablet Take 1 Tablet by mouth 2 (two) times daily as needed for muscle spasms for up to 180 days 180 Tablet 1  . FARXIGA 10 mg tab Take 10 mg by mouth once daily.    SABRA glipiZIDE (GLUCOTROL) 10 mg tablet Take 1 Tablet by mouth 2 (two) times daily before a meal 180 Tablet 3  . ezetimibe (ZETIA) 10 mg tablet Take 1 Tablet by mouth once daily 90 Tablet 3  . amLODIPine  (NORVASC ) 10 mg tablet amlodipine  10 mg tablet    . aspirin   81 mg DR tablet Take 1 Tablet by mouth daily.    . nitroglycerin  (NITROSTAT ) 0.4 mg SL tablet place 1 tablet (0.4 mg) by buccal route at 1st sign of attack; may repeat every 5 minutes up to 3 tabs; if no relief seek medical help    . pantoprazole  (PROTONIX ) 40 mg EC tablet Take 1 Tablet by mouth daily. (Patient not taking: Reported on 02/14/2024)     No current facility-administered medications for this visit.   Allergies  Allergen Reactions  . Gabapentin Intolerance - Will Not Trigger Allergy Alert    Severe muscle pain    Family History  Problem Relation Name Age of Onset  . Other (high blood pressure) Mother      Social History   Tobacco Use  . Smoking status: Every Day    Types: Cigars    Passive exposure: Current  . Smokeless tobacco: Never  . Tobacco comments:    4-5 cig qd  Vaping Use  . Vaping status: Former  . Quit date: 05/22/2022  Substance and Sexual Activity  . Alcohol use: Not Currently    Comment: quit 4 months ago  . Drug use: Not Currently    Frequency: 15.0 times per week    Types: Vaping   . Sexual activity: Not Currently  Other Topics Concern  . Military Service No  . Blood Transfusions No  . Caffeine Concern No  . Occupational Exposure No  . Hobby Hazards No  . Sleep Concern No  . Stress Concern No  . Weight Concern No  . Special Diet No  . Back Care Yes  . Exercise Yes  . Seat Belt Yes  . Self-Exams No   Social Drivers of Corporate Investment Banker Strain: Not on File (11/27/2021)   Financial Resource Strain   . Financial Resource Strain: 0  Food Insecurity: Not on File (05/06/2023)   Food Insecurity   . Food: 0  Transportation Needs: Not on File (11/27/2021)   Transportation Needs   . Transportation: 0  Physical Activity: Not on File (11/27/2021)   Physical Activity   . Physical Activity: 0  Stress: Not on File (11/27/2021)   Stress   . Stress: 0  Social Connections: Not on File (05/03/2023)   Social Connections   . Connectedness: 0   Housing Stability: Not on File (11/27/2021)   Housing Stability   . Housing: 0     Review of Systems  All other systems reviewed and are negative.   Objective BP (!) 152/96  Pulse (!) 103  Temp 97.4 F (36.3 C)  Resp 18  Ht 5' 3 (1.6 m)  Wt 177 lb (80.3 kg)  SpO2 98%  BMI 31.35 kg/m  OB Status Postmenopausal  Smoking Status Every Day  BSA 1.89 m  Results for orders placed or performed in visit on 01/31/24  URINE DIP (POCT) AUTOMATIC Urine Routine     Status: Abnormal   Specimen: Urine  Result Value Ref Range   URINE GLUCOSE NEGATIVE NEGATIVE mg/dL   URINE BILIRUBIN NEGATIVE NEGATIVE   URINE KETONES NEGATIVE NEGATIVE mg/dL   URINE SPECIFIC GRAVITY 1.020 1.015 - >=1.030   URINE BLOOD NEGATIVE NEGATIVE   URINE PH 6.0 5.0 - 8.5   PROTEIN, URINE SMALL (A) NEGATIVE mg/dL   URINE UROBILINOGEN 1.0 0.2 - 1.0 E.U./dL   URINE NITRITE NEGATIVE NEGATIVE   URINE LEUKOCYTES NEGATIVE NEGATIVE   CLARITY OF URINE CLEAR CLEAR   URINE COLOR DARK YELLOW COLORLESS, LIGHT YELLOW, YELLOW, DARK YELLOW, STRAW, AMBER  TSH + FREE T4 Routine     Status: None  Result Value Ref Range   TSH 0.983 0.450 - 4.500 uIU/mL   T4, FREE(DIRECT) 1.34 0.82 - 1.77 ng/dL  COMPREHENSIVE METABOLIC PANEL Routine     Status: Abnormal  Result Value Ref Range   GLUCOSE, SERUM 225 (H) 70 - 99 mg/dL   BUN 7 (L) 8 - 27 mg/dL   CREATININE, SERUM 9.25 0.57 - 1.00 mg/dL   eGFR 92 >40 fO/fpw/8.26   BUN/CREATININE RATIO 9 (L) 12 - 28   SODIUM, SERUM 142 134 - 144 mmol/L   POTASSIUM, SERUM 4.9 3.5 - 5.2 mmol/L   CHLORIDE, SERUM 103 96 - 106 mmol/L   CARBON DIOXIDE, TOTAL 22 20 - 29 mmol/L   CALCIUM , SERUM 9.8 8.7 - 10.3 mg/dL   PROTEIN, TOTAL, SERUM 6.8 6.0 - 8.5 g/dL   ALBUMIN, SERUM 4.4 3.9 - 4.9 g/dL   GLOBULIN, TOTAL 2.4 1.5 - 4.5 g/dL   BILIRUBIN, TOTAL 0.6 0.0 - 1.2 mg/dL   ALKALINE PHOSPHATASE, SERUM 105 44 - 121 IU/L   AST (SGOT) 11 0 - 40 IU/L   ALT (SGPT) 14 0 - 32 IU/L  LIPID PANEL Routine      Status: Abnormal  Result Value Ref Range   CHOLESTEROL, TOTAL 220 (H) 100 - 199 mg/dL   TRIGLYCERIDES 828 (H) 0 - 149 mg/dL   HDL CHOLESTEROL 51 >60 mg/dL   VLDL CHOLESTEROL CAL 31 5 - 40 mg/dL   LDL CHOL CALC (NIH) 861 (H) 0 - 99 mg/dL  BLOOD COUNT COMPLETE AUTO&AUTO DIFRNTL WBC Routine     Status: Abnormal  Result Value Ref Range   WHITE BLOOD CELL(WBC) COUNT 5.3 3.4 - 10.8 x10E3/uL   RED BLOOD CELL (RBC) COUNT 4.67 3.77 - 5.28 x10E6/uL   HEMOGLOBIN 15.7 11.1 - 15.9 g/dL   HEMATOCRIT 52.6 (H) 65.9 - 46.6 %   MCV 101 (H) 79 - 97 fL   MCH 33.6 (H) 26.6 - 33.0 pg   MCHC 33.2 31.5 - 35.7 g/dL   RDW 87.3 88.2 - 84.5 %   PLATELETS 330 150 - 450 x10E3/uL   NEUTROPHILS 50 Not Estab. %   LYMPHS 42 Not Estab. %   MONOCYTES 6 Not Estab. %   EOS 1 Not Estab. %   BASOPHILS 1 Not Estab. %   NEUTROPHILS (ABSOLUTE) 2.6 1.4 - 7.0 x10E3/uL   LYMPHS (ABSOLUTE) 2.2 0.7 - 3.1 x10E3/uL   MONOCYTES(ABSOLUTE) 0.3 0.1 - 0.9 x10E3/uL   EOS (ABSOLUTE) 0.1 0.0 - 0.4 x10E3/uL   BASO (ABSOLUTE) 0.0 0.0 - 0.2 x10E3/uL   IMMATURE GRANULOCYTES 0 Not Estab. %  IMMATURE GRANS (ABS) 0.0 0.0 - 0.1 x10E3/uL  NUSWAB VG+, HSV Vaginal Vaginal Routine     Status: Abnormal   Specimen: Vaginal  Result Value Ref Range   ATOPOBIUM VAGINAE High - 2 (A) Score   BVAB 2 Low - 0 Score   MEGASPHAERA 1 Low - 0 Score    Comment: Calculate total score by adding the 3 individual bacterial vaginosis (BV) marker scores together.  Total score is interpreted as follows: Total score 0-1: Indicates the absence of BV. Total score   2: Indeterminate for BV. Additional clinical                  data should be evaluated to establish a                  diagnosis. Total score 3-6: Indicates the presence of BV.    CANDIDA ALBICANS, NAA Negative Negative   CANDIDA GLABRATA, NAA Negative Negative   TRICH VAG BY NAA Negative Negative   CHLAMYDIA TRACHOMATIS, NAA Negative Negative   NEISSERIA GONORRHOEAE, NAA Negative Negative   HSV 1  NAA Negative Negative   HSV 2 NAA Negative Negative  HEPATITIS C ANTIBODY WITH REFLEX TO HCV, RNA, QUANTITATIVE, REAL-TIME PCR (REFL) Routine     Status: None  Result Value Ref Range   HCV AB Non Reactive Non Reactive  HIV 1/0/2 AG/AB W/CASCADE RFLX SUPPLEMENTAL TESTING Routine     Status: None  Result Value Ref Range   HIV SCREEN 4TH GENERATION WRFX Non Reactive Non Reactive    Comment: HIV-1/HIV-2 antibodies and HIV-1 p24 antigen were NOT detected. There is no laboratory evidence of HIV infection. HIV Negative   RPR (MONITOR) W/REFL TITER Routine     Status: None  Result Value Ref Range   RPR Non Reactive Non Reactive   INTERPRETATION:      Comment: Syphilis: RPR with Reflex to RPR Titer and Treponemal           Antibodies, Traditional Screening and Diagnosis           Algorithm ------------------------------------------------------------                        Treponemal RPR        RPR, Qn         Ab       Final Interpretation --------   ---------   ----------   ------------------------ Non        N/A         N/A          No laboratory evidence Reactive                            of syphilis. Retest in                                     2-4 weeks if recent                                     exposure is suspected. --------   ---------   ----------   ------------------------ Reactive   >/=1:1      Non          Nontreponemal antibodies  Reactive     detected. Syphilis                                     unlikely; biological                                     false positive possible.                                     Retest in 2-4 weeks if                                     recent exposure  is                                     suspected. --------   ---------   ----------   ------------------------ Reactive   >/=1:1      Reactive     Treponemal and                                     nontreponemal antibodies                                      detected. Consistent                                     with past or current                                     (potential early)                                     syphilis.   RFLX-INTERPRETATION Blood Blood Routine     Status: None   Specimen: Blood  Result Value Ref Range   INTERPRETATION      Comment: Not infected with HCV unless early or acute infection is suspected (which may be delayed in an immunocompromised individual), or other evidence exists to indicate HCV infection.     Physical Exam Vitals and nursing note reviewed.   Constitutional:      General: She is not in acute distress.    Appearance: She has obesity HENT:     Head: Normocephalic.     Right Ear: Tympanic membrane, ear canal and external ear normal. There is no impacted cerumen.     Left Ear: Tympanic membrane, ear canal and external ear normal.     Nose: Nose normal. No congestion or rhinorrhea.     Mouth/Throat:     Mouth: Mucous membranes are moist.     Pharynx: No oropharyngeal exudate or posterior oropharyngeal erythema.  Eyes:     General: No scleral icterus.  Conjunctiva/sclera: Conjunctivae normal.     Pupils: Pupils are equal, round, and reactive to light.  Neck:     Thyroid: No thyroid mass, thyromegaly or thyroid tenderness.   Cardiovascular:     Rate and Rhythm: Normal rate and regular rhythm.     Pulses: Normal pulses.     Heart sounds: Normal heart sounds.  Pulmonary:     Effort: Pulmonary effort is normal.     Breath sounds: Normal breath sounds.  Abdominal:     General: Bowel sounds are normal.     Palpations: Abdomen is soft.     Tenderness: There is no right CVA tenderness or left CVA tenderness.  Musculoskeletal:        General: Normal range of motion.     Cervical back: Normal range of motion.     Right lower leg: No edema.     Left lower leg: No edema.  Skin:    General: Skin is warm.     Capillary Refill: Capillary refill takes less than 2 seconds.  Neurological:      General: No focal deficit present.     Mental Status: She is alert and oriented to person, place, and time.  Psychiatric:        Mood and Affect: Mood normal.        Behavior: Behavior normal.        Thought Content: Thought content normal.        Judgment: Judgment normal.     Assessment & Plan  1. Encounter for general adult medical examination with abnormal findings - URINE DIP (POCT) AUTOMATIC Urine Routine - TSH + FREE T4 Routine - COMPREHENSIVE METABOLIC PANEL Routine - LIPID PANEL Routine - BLOOD COUNT COMPLETE AUTO&AUTO DIFRNTL WBC Routine - NUSWAB VG+, HSV Vaginal Vaginal Routine - HEPATITIS C ANTIBODY WITH REFLEX TO HCV, RNA, QUANTITATIVE, REAL-TIME PCR (REFL) Routine - HIV 1/0/2 AG/AB W/CASCADE RFLX SUPPLEMENTAL TESTING Routine - RPR (MONITOR) W/REFL TITER Routine - SCREENING FOR DEPRESSION PERFORMED - SCREENING - GAD-7 - RFLX-INTERPRETATION Blood Blood Routine  2. Positive depression screening - - SCREENING FOR DEPRESSION PERFORMED - SCREENING - GAD-7  3. 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 Primary hypertension - - TSH + FREE T4 Routine - COMPREHENSIVE METABOLIC PANEL Routine - spironolactone (ALDACTONE) 25 mg tablet; Take 1 Tablet by mouth once daily for 180 days.  Dispense: 90 Tablet; Refill: 1  6. Vitamin D deficiency - BLOOD COUNT COMPLETE AUTO&AUTO DIFRNTL WBC Routine  7. Mixed hyperlipidemia - LIPID PANEL Routine  8. Screening examination for sexually transmitted disease - NUSWAB VG+, HSV Vaginal Vaginal Routine - HEPATITIS C ANTIBODY WITH REFLEX TO HCV, RNA, QUANTITATIVE, REAL-TIME PCR (REFL) Routine - HIV 1/0/2 AG/AB W/CASCADE RFLX SUPPLEMENTAL TESTING Routine - RPR (MONITOR) W/REFL TITER Routine  9. Encounter  for colorectal cancer screening  - Cologuard colon cancer screening Stool Stool Routine  10. Encounter for screening mammogram for malignant neoplasm of breast -Patient's last mammogram labs completed at the breast center on 04/07/2023.she has plans plans to repeat mammogram in 1 year around April 06, 2024. - Patient encouraged to follow-up with the breast center as scheduled to have mammography completed on follow-up for breast cancer screening. 11. Dietary counseling  12. Exercise counseling

## 2024-02-14 NOTE — Progress Notes (Signed)
 Chief Complaint  Patient presents with  . Follow Up    Room 2 pt here for 2 week bp f/u, pt states she is feeling well no complaints    HPI Subjective Patient ID: Darlene Pugh is a 63 year old female who presents for Follow Up (Room 2 pt here for 2 week bp f/u, pt states she is feeling well no complaints).  Patient presents to clinic for 2 weeks follows up for hypertension. Patient states she had to go to [facility] to have lab work completed that were ordered during her last visit. She state she has not take her BP medications today. Her in-home blood pressures have been ranging between 157/87, 150/94, 168/90, 149/100,  202/130, 186/102, 165/95, 161/98, 159/98, 149/99, 191/102, 182/101, 188/100, 175/99, 176/98, 168/91, 156/92, 168/100, 190/95, and  168/92.  She denies one-sided weakness, visual changes, chest pain, dizziness, heart palpitations, orthopnea, dyspnea, or lower extremity edema.  Patient also denies other acute concerns at this time.  Health Maintenance Due  Topic Date Due  . Imm-DTaP/Tdap/Td (1 - Tdap) Never done  . Imm-Pneumococcal 50+ (1 of 2 - PCV) Never done  . Imm-Zoster, Recombinant (1 of 2) Never done  . Imm-COVID-19 (1 - 2024-25 season) Never done   Past Medical History:  Diagnosis Date  . High blood pressure    Past Surgical History:  Procedure Laterality Date  . ESWL FOR GALLSTONES     Current Outpatient Medications  Medication Sig Dispense Refill  . metroNIDAZOLE (FLAGYL) 500 mg tablet Take 1 Tablet by mouth 2 (two) times daily for 7 days Indications: an infection of the vagina called bacterial vaginosis. 14 Tablet 0  . metoprolol  succinate XL (TOPROL -XL) 100 mg 24 hr tablet Take 1 Tablet by mouth daily.    SABRA spironolactone (ALDACTONE) 25 mg tablet Take 1 Tablet by mouth once daily for 180 days. 90 Tablet 1  . albuterol HFA 90 mcg/actuation inhaler INHALE 2 PUFFS INTO THE LUNGS EVERY 6 HOURS AS NEEDED FOR SHORTNESS OF BREATH OR WHEEZING 18 g 3  . metFORMIN   (GLUCOPHAGE ) 1,000 mg tablet TAKE 1 TABLET BY MOUTH TWICE DAILY WITH A MEAL 180 Tablet 3  . atorvastatin  (LIPITOR ) 80 mg tablet TAKE 1 TABLET BY MOUTH EVERY NIGHT AT BEDTIME 90 Tablet 3  . QUEtiapine (SEROQUEL XR) 50 mg Tb24 24 hr tablet Take 1 Tablet by mouth nightly at bedtime 90 Tablet 3  . blood sugar diagnostic strips 1 Each 3 (three) times daily before meals 100 Each 11  . blood-glucose meter monitoring kit as needed for blood glucose monitoring 1 Each 0  . lancets Use check blood glucose 3 times daily before meals 100 Each 11  . lidocaine  (LIDODERM ) 5 % patch apply 1 patch by transdermal route once daily (May wear up to 12hours.) 30 Patch 11  . blood pressure monitor Use to check blood pressure twice daily and records BP logs nd bring to your office visits 1 Kit 0  . ergocalciferol (VITAMIN D-2) 1,250 mcg (50,000 unit) capsule Take 1 Capsule by mouth once a week for 180 days take 1 capsule by oral route once Weekly 12 Capsule 1  . lisinopriL  40 mg tablet Take 1 Tablet by mouth daily. 90 Tablet 3  . meloxicam  (MOBIC ) 7.5 mg tablet Take 1 Tablet by mouth once daily for 90 days 90 Tablet 0  . tiZANidine (ZANAFLEX) 2 mg tablet Take 1 Tablet by mouth 2 (two) times daily as needed for muscle spasms for up to 180 days 180  Tablet 1  . FARXIGA 10 mg tab Take 10 mg by mouth once daily.    SABRA glipiZIDE (GLUCOTROL) 10 mg tablet Take 1 Tablet by mouth 2 (two) times daily before a meal 180 Tablet 3  . ezetimibe (ZETIA) 10 mg tablet Take 1 Tablet by mouth once daily 90 Tablet 3  . amLODIPine  (NORVASC ) 10 mg tablet amlodipine  10 mg tablet    . aspirin  81 mg DR tablet Take 1 Tablet by mouth daily.    . nitroglycerin  (NITROSTAT ) 0.4 mg SL tablet place 1 tablet (0.4 mg) by buccal route at 1st sign of attack; may repeat every 5 minutes up to 3 tabs; if no relief seek medical help    . pantoprazole  (PROTONIX ) 40 mg EC tablet Take 1 Tablet by mouth daily. (Patient not taking: Reported on 02/14/2024)     No current  facility-administered medications for this visit.   Allergies  Allergen Reactions  . Gabapentin Intolerance - Will Not Trigger Allergy Alert    Severe muscle pain    Family History  Problem Relation Name Age of Onset  . Other (high blood pressure) Mother      Social History   Tobacco Use  . Smoking status: Every Day    Types: Cigars    Passive exposure: Current  . Smokeless tobacco: Never  . Tobacco comments:    4-5 cig qd  Vaping Use  . Vaping status: Former  . Quit date: 05/22/2022  Substance and Sexual Activity  . Alcohol use: Not Currently    Comment: quit 4 months ago  . Drug use: Not Currently    Frequency: 15.0 times per week    Types: Vaping   . Sexual activity: Not Currently  Other Topics Concern  . Military Service No  . Blood Transfusions No  . Caffeine Concern No  . Occupational Exposure No  . Hobby Hazards No  . Sleep Concern No  . Stress Concern No  . Weight Concern No  . Special Diet No  . Back Care Yes  . Exercise Yes  . Seat Belt Yes  . Self-Exams No   Social Drivers of Corporate Investment Banker Strain: Not on File (11/27/2021)   Financial Resource Strain   . Financial Resource Strain: 0  Food Insecurity: Not on File (05/06/2023)   Food Insecurity   . Food: 0  Transportation Needs: Not on File (11/27/2021)   Transportation Needs   . Transportation: 0  Physical Activity: Not on File (11/27/2021)   Physical Activity   . Physical Activity: 0  Stress: Not on File (11/27/2021)   Stress   . Stress: 0  Social Connections: Not on File (05/03/2023)   Social Connections   . Connectedness: 0  Housing Stability: Not on File (11/27/2021)   Housing Stability   . Housing: 0     Review of Systems  All other systems reviewed and are negative.   Objective BP (!) 150/84 (Left Arm, Sitting, Regular Adult)  Pulse 100  Temp 98.1 F (36.7 C)  Resp 18  Ht 5' 3 (1.6 m)  Wt 177 lb 6.4 oz (80.5 kg)  SpO2 97%  BMI 31.42 kg/m  OB Status  Postmenopausal  Smoking Status Every Day  BSA 1.89 m  No results found for this visit on 02/14/24.  Physical Exam Vitals and nursing note reviewed.   Constitutional:      General: She is not in acute distress.    Appearance: She has obesity HENT:  Head: Normocephalic.  Neck:     Thyroid: No thyroid mass, thyromegaly or thyroid tenderness.   Cardiovascular:     Rate and Rhythm: Normal rate and regular rhythm.     Pulses: Normal pulses.     Heart sounds: Normal heart sounds.  Pulmonary:     Effort: Pulmonary effort is normal.     Breath sounds: Normal breath sounds.  Abdominal:     General: Bowel sounds are normal.     Palpations: Abdomen is soft.     Tenderness: There is no right CVA tenderness or left CVA tenderness.  Musculoskeletal:        General: Normal range of motion.     Right lower leg: No edema.     Left lower leg: No edema.  Skin:    General: Skin is warm and dry.     Capillary Refill: Capillary refill takes less than 2 seconds.  Neurological:     General: No focal deficit present.     Mental Status: She is alert and oriented to person, place, and time.  Psychiatric:        Mood and Affect: Mood normal.        Behavior: Behavior normal.        Thought Content: Thought content normal.        Judgment: Judgment normal.     Assessment & Plan  1. Primary hypertension - Blood pressure remains uncontrolled. - Patient reports she left home this morning for lab work and has not taken her blood pressure medications today. -She brought in home wrist blood pressure monitor for review that showed her blood pressure has been very high at home ranging between 157/87, 150/94, 168/90, 149/100,  202/130, 186/102, 165/95, 161/98, 159/98, 149/99, 191/102, 182/101, 188/100, 175/99, 176/98, 168/91, 156/92, 168/100, 190/95, and  168/92.   -Will reevaluate her blood pressure in 6 weeks and adjust her medications if necessary.

## 2024-03-17 ENCOUNTER — Ambulatory Visit (HOSPITAL_COMMUNITY): Admission: EM | Admit: 2024-03-17 | Discharge: 2024-03-17 | Disposition: A | Discharge status: Home / Self Care

## 2024-03-17 ENCOUNTER — Encounter (HOSPITAL_COMMUNITY): Payer: Self-pay | Admitting: Emergency Medicine

## 2024-03-17 DIAGNOSIS — R200 Anesthesia of skin: Secondary | ICD-10-CM

## 2024-03-17 DIAGNOSIS — R202 Paresthesia of skin: Secondary | ICD-10-CM | POA: Diagnosis not present

## 2024-03-17 LAB — POCT URINALYSIS DIP (MANUAL ENTRY)
Blood, UA: NEGATIVE
Glucose, UA: >=1000 mg/dL — AB
Leukocytes, UA: NEGATIVE
Nitrite, UA: NEGATIVE
Protein Ur, POC: NEGATIVE mg/dL
Spec Grav, UA: 1.02 (ref 1.010–1.025)
Urobilinogen, UA: 1 U/dL
pH, UA: 6 (ref 5.0–8.0)

## 2024-03-17 LAB — POCT FASTING CBG KUC MANUAL ENTRY: POCT Glucose (KUC): 225 mg/dL — AB (ref 70–99)

## 2024-03-17 NOTE — Discharge Instructions (Signed)
 Please monitor your symptoms If at any point you develop one sided numbness, one sided weakness, difficulty with walking or balance, headache, dizziness, chest pain or pressure, or any slurred speech, please go to the emergency department.  Otherwise follow with your primary care provider at your visit in 10 days

## 2024-03-17 NOTE — ED Triage Notes (Signed)
 Pt reports left left started getting weak and wanting to give out on her. Pt reports left sided facial numbness and left hand and arm numbness that started yesterday.  Pt has pain in left arm that started two days ago. Reports had been intermittent before that.  Pt has issues with right knee and has pain in it.

## 2024-03-17 NOTE — ED Provider Notes (Signed)
 [facility]    CSN: [id number] Arrival date & time: 03/17/24  1746      History   Chief Complaint Chief Complaint  Patient presents with   Extremity Weakness   Numbness    HPI Darlene Pugh is a 63 y.o. female.  2 days ago she had some tingling in her left hand fingertips. It resolved yesterday. She did not have any symptoms in the arm. She felt like her left foot was going to give out, also 2 days ago. This was only once upon standing from the couch. Also has pain in her left knee, but this is normal for her She had a small area of numbness near her left lip, almost fully resolved today. Denies any other facial numbness or droop.  She denies any headache, vision changes, feeling off balance, one sided weakness, gait abnormality. No chest pain, shortness of breath, abdominal pain, nausea/vomiting.   Feels fatigued, but this has been for a while  Her daughter is here and denies any mental status change, and reports her speech is normal.  DM history. Does not check sugar at home Reports takes her medications usually   Past Medical History:  Diagnosis Date   Diabetes mellitus without complication (HCC)    Hypertension     Patient Active Problem List   Diagnosis Date Noted   Acute non Q wave MI (myocardial infarction), initial episode of care (HCC) 07/31/2017    Past Surgical History:  Procedure Laterality Date   LEFT HEART CATH AND CORONARY ANGIOGRAPHY N/A 08/02/2017   Procedure: LEFT HEART CATH AND CORONARY ANGIOGRAPHY;  Surgeon: Levern Hutching, MD;  Location: [facility];  Service: Cardiovascular;  Laterality: N/A;    OB History   No obstetric history on file.      Home Medications    Prior to Admission medications   Medication Sig Start Date End Date Taking? Authorizing Provider  meloxicam  (MOBIC ) 7.5 MG tablet Take 7.5 mg by mouth. 05/21/23  Yes [provider]  metoprolol  succinate (TOPROL -XL) 100 MG 24 hr tablet  Take 100 mg by mouth. 10/21/23 02/22/25 Yes [provider]  aspirin  EC 81 MG EC tablet Take 1 tablet (81 mg total) by mouth daily. 08/04/17   Levern Hutching, MD  atorvastatin  (LIPITOR ) 80 MG tablet Take 1 tablet (80 mg total) by mouth daily at 6 PM. 08/03/17   Levern Hutching, MD  FARXIGA 10 MG TABS tablet Take 10 mg by mouth daily.    [provider]  gabapentin (NEURONTIN) 100 MG capsule Take 100 mg by mouth at bedtime.     [provider]  lidocaine  (LIDODERM ) 5 % 1 patch daily.    [provider]  lisinopril  (PRINIVIL ,ZESTRIL ) 20 MG tablet Take 1 tablet (20 mg total) by mouth daily. 10/18/15   Cartner, Morene, PA-C  metFORMIN  (GLUCOPHAGE ) 500 MG tablet Take 1 tablet (500 mg total) by mouth daily with breakfast. 08/04/17   Levern Hutching, MD  metoprolol  tartrate (LOPRESSOR ) 25 MG tablet Take 1 tablet (25 mg total) by mouth 2 (two) times daily. 08/03/17   Levern Hutching, MD  nitroGLYCERIN  (NITROSTAT ) 0.4 MG SL tablet Place 1 tablet (0.4 mg total) under the tongue every 5 (five) minutes x 3 doses as needed for chest pain. 08/03/17   Levern Hutching, MD  pantoprazole  (PROTONIX ) 40 MG tablet Take 1 tablet (40 mg total) by mouth daily. 08/03/17   Levern Hutching, MD  QUEtiapine (SEROQUEL) 25 MG tablet Take 25 mg by  mouth at bedtime.    [provider]  ticagrelor  (BRILINTA ) 90 MG TABS tablet Take 1 tablet (90 mg total) by mouth 2 (two) times daily. 08/03/17   Levern Hutching, MD  VENTOLIN HFA 108 (90 Base) MCG/ACT inhaler Inhale 2 puffs into the lungs every 6 (six) hours as needed.    [provider]  Vitamin D, Ergocalciferol, (DRISDOL) 50000 units CAPS capsule Take 50,000 Units by mouth every 7 (seven) days.    [provider]    Family History No family history on file.  Social History Social History   Tobacco Use   Smoking status: Every Day    Types: Cigarettes   Smokeless tobacco: Never  Substance Use Topics   Alcohol use: No    Drug use: No     Allergies   Patient has no known allergies.   Review of Systems Review of Systems As per HPI  Physical Exam Triage Vital Signs ED Triage Vitals [03/17/24 1804]  Encounter Vitals Group     BP      Girls Systolic BP Percentile      Girls Diastolic BP Percentile      Boys Systolic BP Percentile      Boys Diastolic BP Percentile      Pulse      Resp      Temp      Temp src      SpO2      Weight      Height      Head Circumference      Peak Flow      Pain Score 7     Pain Loc      Pain Education      Exclude from Growth Chart    No data found.  Updated Vital Signs BP 139/86 (BP Location: Right Arm)   Pulse 85   Temp 98.1 F (36.7 C) (Oral)   Resp 18   SpO2 92%    Physical Exam Vitals and nursing note reviewed.  Constitutional:      General: She is not in acute distress. HENT:     Head: Normocephalic and atraumatic.     Comments: No facial droop. Bilateral eyebrow raise. Sensation intact and equal bilaterally     Right Ear: Tympanic membrane and ear canal normal.     Left Ear: Tympanic membrane and ear canal normal.     Nose: Nose normal.     Mouth/Throat:     Mouth: Mucous membranes are moist.     Pharynx: Oropharynx is clear.  Eyes:     General: Vision grossly intact. Gaze aligned appropriately.     Extraocular Movements: Extraocular movements intact.     Conjunctiva/sclera: Conjunctivae normal.     Pupils: Pupils are equal, round, and reactive to light.  Cardiovascular:     Rate and Rhythm: Normal rate and regular rhythm.     Heart sounds: Normal heart sounds.  Pulmonary:     Effort: Pulmonary effort is normal. No respiratory distress.     Breath sounds: Normal breath sounds.  Abdominal:     Palpations: Abdomen is soft.     Tenderness: There is no abdominal tenderness.  Musculoskeletal:        General: Normal range of motion.     Cervical back: Normal range of motion.     Comments: Strength 5/5 upper and lower extremities.  Sensation is equal in bilateral arms, hands, legs, feet.  Neurological:     General: No focal  deficit present.     Mental Status: She is alert and oriented to person, place, and time.     Cranial Nerves: No cranial nerve deficit or facial asymmetry.     Sensory: Sensation is intact. No sensory deficit.     Motor: Motor function is intact. No weakness.     Coordination: Coordination is intact. Coordination normal. Finger-Nose-Finger Test normal.     Gait: Gait is intact. Gait normal.     Comments: Mental status and speech are baseline per daughter      UC Treatments / Results  Labs (all labs ordered are listed, but only abnormal results are displayed) Labs Reviewed  POCT FASTING CBG [facility] MANUAL ENTRY - Abnormal; Notable for the following components:      Result Value   POCT Glucose ([facility]) 225 (*)    All other components within normal limits  POCT URINALYSIS DIP (MANUAL ENTRY) - Abnormal; Notable for the following components:   Color, UA straw (*)    Clarity, UA cloudy (*)    Glucose, UA >=1,000 (*)    Bilirubin, UA small (*)    Ketones, POC UA small (15) (*)    All other components within normal limits    EKG  Radiology No results found.  Procedures Procedures (including critical care time)  Medications Ordered in UC Medications - No data to display  Initial Impression / Assessment and Plan / UC Course  I have reviewed the triage vital signs and the nursing notes.  Pertinent labs & imaging results that were available during my care of the patient were reviewed by me and considered in my medical decision making (see chart for details).  Stable vitals  Patient had vague non-specific symptoms 2 days ago that have overall resolved. Reported small area of numbness on the face, tingling in the fingers, knee pain, and a single incidence of weakness in the foot. I have no concern for life threatening process at this time She is neurologically intact  CBC 225 UA with glucose  and small ketones. No infection. Normal spec grav.  She declines blood work today. She sees her PCP in 10 days and will have blood work then. We have discussed strict ED precautions, and signs/symptoms of stroke that would need immediate evaluation.  Otherwise vague symptoms at this time and reassurance is provided. Will follow with PCP  Final Clinical Impressions(s) / UC Diagnoses   Final diagnoses:  Tingling sensation  Numbness of lip     Discharge Instructions      Please monitor your symptoms If at any point you develop one sided numbness, one sided weakness, difficulty with walking or balance, headache, dizziness, chest pain or pressure, or any slurred speech, please go to the emergency department.  Otherwise follow with your primary care provider at your visit in 10 days     ED Prescriptions   None    PDMP not reviewed this encounter.   Jeryl Asberry RIGGERS 03/17/24 2043

## 2024-04-12 NOTE — Progress Notes (Signed)
 Chief Complaint  Patient presents with  . Follow Up    Follow up BP. Brought medications in. MA already confirmed.    HPI Subjective Patient ID: Darlene Pugh is a 63 year old female who presents for Follow Up (Follow up BP. Brought medications in. MA already confirmed.). Patient presents to clinic for 6-week follow up for hypertension.  Her blood pressure is currently well-controlled at 125/80.  Patient brought in two of her blood pressure machines to be checked in office today as directed during her previous visit.  Her wrist blood pressure monitor seems to be small and too tight for her wrist while her second blood pressure monitor is malfunctioning.  Patient's A1c ordered last visit and was counseled by the lab.  A1c repeated today at point-of-care that was A1c 9.2%.  Patient reports compliant with her diabetes medication and states her cardiologist Dr. Levern recently prescribed her Farxiga in addition to her glipizide and metformin .  Patient declines injectable diabetes medications at this time.  Patient denies chest pain, syncope, dyspnea, orthopnea, lower extremity edema, blurred vision, or one-sided weakness.  She also denies other acute concerns at this time.   Health Maintenance Due  Topic Date Due  . Imm-DTaP/Tdap/Td (1 - Tdap) Never done  . Imm-Pneumococcal 50+ (1 of 2 - PCV) Never done  . Imm-Zoster, Recombinant (1 of 2) Never done  . Imm-Influenza (1) 04/10/2024  . Imm-COVID-19 (1 - 2024-25 season) 04/10/2024   Past Medical History:  Diagnosis Date  . High blood pressure    Past Surgical History:  Procedure Laterality Date  . ESWL FOR GALLSTONES     Current Outpatient Medications  Medication Sig Dispense Refill  . blood-glucose meter (ACCU-CHEK GUIDE ME GLUCOSE MTR) monitoring kit 4 (four) times daily with meals and nightly for 90 days Use as directed.. 1 Each 0  . metoprolol  succinate XL (TOPROL -XL) 100 mg 24 hr tablet Take 1 Tablet by mouth daily Take 1 Tablet by mouth  daily.. 90 Tablet 3  . spironolactone (ALDACTONE) 25 mg tablet Take 1 Tablet by mouth once daily for 180 days. 90 Tablet 1  . albuterol HFA 90 mcg/actuation inhaler INHALE 2 PUFFS INTO THE LUNGS EVERY 6 HOURS AS NEEDED FOR SHORTNESS OF BREATH OR WHEEZING 18 g 3  . metFORMIN  (GLUCOPHAGE ) 1,000 mg tablet TAKE 1 TABLET BY MOUTH TWICE DAILY WITH A MEAL 180 Tablet 3  . atorvastatin  (LIPITOR ) 80 mg tablet TAKE 1 TABLET BY MOUTH EVERY NIGHT AT BEDTIME 90 Tablet 3  . QUEtiapine (SEROQUEL XR) 50 mg Tb24 24 hr tablet Take 1 Tablet by mouth nightly at bedtime 90 Tablet 3  . blood sugar diagnostic strips 1 Each 3 (three) times daily before meals 100 Each 11  . lancets Use check blood glucose 3 times daily before meals 100 Each 11  . lidocaine  (LIDODERM ) 5 % patch apply 1 patch by transdermal route once daily (May wear up to 12hours.) 30 Patch 11  . blood pressure monitor Use to check blood pressure twice daily and records BP logs nd bring to your office visits 1 Kit 0  . ergocalciferol (VITAMIN D-2) 1,250 mcg (50,000 unit) capsule Take 1 Capsule by mouth once a week for 180 days take 1 capsule by oral route once Weekly 12 Capsule 1  . lisinopriL  40 mg tablet Take 1 Tablet by mouth daily. 90 Tablet 3  . meloxicam  (MOBIC ) 7.5 mg tablet Take 1 Tablet by mouth once daily for 90 days 90 Tablet 0  . tiZANidine (  ZANAFLEX) 2 mg tablet Take 1 Tablet by mouth 2 (two) times daily as needed for muscle spasms for up to 180 days 180 Tablet 1  . FARXIGA 10 mg tab Take 10 mg by mouth once daily.    SABRA glipiZIDE (GLUCOTROL) 10 mg tablet Take 1 Tablet by mouth 2 (two) times daily before a meal 180 Tablet 3  . ezetimibe (ZETIA) 10 mg tablet Take 1 Tablet by mouth once daily 90 Tablet 3  . amLODIPine  (NORVASC ) 10 mg tablet amlodipine  10 mg tablet    . aspirin  81 mg DR tablet Take 1 Tablet by mouth daily.    . nitroglycerin  (NITROSTAT ) 0.4 mg SL tablet place 1 tablet (0.4 mg) by buccal route at 1st sign of attack; may repeat every  5 minutes up to 3 tabs; if no relief seek medical help    . pantoprazole  (PROTONIX ) 40 mg EC tablet Take 1 Tablet by mouth daily. (Patient not taking: Reported on 02/14/2024)     No current facility-administered medications for this visit.   Allergies  Allergen Reactions  . Gabapentin Intolerance - Will Not Trigger Allergy Alert    Severe muscle pain    Family History  Problem Relation Name Age of Onset  . Other (high blood pressure) Mother      Social History   Tobacco Use  . Smoking status: Every Day    Types: Cigars    Passive exposure: Current  . Smokeless tobacco: Never  . Tobacco comments:    4-5 cig qd  Vaping Use  . Vaping status: Former  . Quit date: 05/22/2022  Substance and Sexual Activity  . Alcohol use: Not Currently    Comment: quit 4 months ago  . Drug use: Not Currently    Frequency: 15.0 times per week    Types: Vaping   . Sexual activity: Not Currently  Other Topics Concern  . Military Service No  . Blood Transfusions No  . Caffeine Concern No  . Occupational Exposure No  . Hobby Hazards No  . Sleep Concern No  . Stress Concern No  . Weight Concern No  . Special Diet No  . Back Care Yes  . Exercise Yes  . Seat Belt Yes  . Self-Exams No   Social Drivers of Corporate Investment Banker Strain: Not on File (11/27/2021)   Financial Resource Strain   . Financial Resource Strain: 0  Food Insecurity: Not on File (05/06/2023)   Food Insecurity   . Food: 0  Transportation Needs: Not on File (11/27/2021)   Transportation Needs   . Transportation: 0  Physical Activity: Not on File (11/27/2021)   Physical Activity   . Physical Activity: 0  Stress: Not on File (11/27/2021)   Stress   . Stress: 0  Social Connections: Not on File (05/03/2023)   Social Connections   . Connectedness: 0  Housing Stability: Not on File (11/27/2021)   Housing Stability   . Housing: 0     Review of Systems  All other systems reviewed and are negative.   Objective BP  125/80  Pulse 98  Temp 97.2 F (36.2 C)  Resp 18  Ht 5' 3 (1.6 m)  Wt 169 lb (76.7 kg)  SpO2 96%  BMI 29.94 kg/m  OB Status Postmenopausal  Smoking Status Every Day  BSA 1.85 m  Pain Heath 0/10 (Edu: Yes)  Results for orders placed or performed in visit on 04/12/24  A1C, ALERE AFINION (POCT) Routine  Status: Abnormal  Result Value Ref Range   HGB A1C 9.2 (A) 4.8 - 5.6 %    Physical Exam Vitals and nursing note reviewed.   Constitutional:      General: She is not in acute distress. HENT:     Head: Normocephalic.  Neck:     Thyroid: No thyroid mass, thyromegaly or thyroid tenderness.   Cardiovascular:     Rate and Rhythm: Normal rate and regular rhythm.     Pulses: Normal pulses.     Heart sounds: Normal heart sounds.  Pulmonary:     Breath sounds: Normal breath sounds.  Abdominal:     General: Bowel sounds are normal.     Palpations: Abdomen is soft.     Tenderness: There is no right CVA tenderness or left CVA tenderness.  Musculoskeletal:        General: Normal range of motion.     Right lower leg: No edema.     Left lower leg: No edema.  Skin:    General: Skin is warm and dry.     Capillary Refill: Capillary refill takes less than 2 seconds.  Neurological:     General: No focal deficit present.     Mental Status: She is alert and oriented to person, place, and time.  Psychiatric:        Mood and Affect: Mood normal.        Behavior: Behavior normal.        Thought Content: Thought content normal.        Judgment: Judgment normal.     Assessment & Plan  1. Primary hypertension -Blood pressure is currently well-controlled. - Will continue same medications and follow-up to reevaluate in 3 months. - Patient encouraged to continue with lifestyle modifications to promote normal blood pressure.  2. 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 -Farxiga 10 mg  daily was recently added to her antihyperglycemic  regimen by her cardiologist last month per patient report. -Patient declines recommendations for basal insulin  or any injectable diabetes medications at this time. -Will reevaluate in 3 months and adjust her treatment as necessary. - A1C, ALERE AFINION (POCT) Routine  3. Vitamin D deficiency -Records  compliant with her vitamin D supplement once weekly and denies recent falls.  -Will reevaluate her response to treatment in 3 months.

## 2024-07-10 ENCOUNTER — Emergency Department (HOSPITAL_COMMUNITY)

## 2024-07-10 ENCOUNTER — Observation Stay (HOSPITAL_COMMUNITY)

## 2024-07-10 ENCOUNTER — Other Ambulatory Visit: Payer: Self-pay

## 2024-07-10 ENCOUNTER — Encounter (HOSPITAL_COMMUNITY): Payer: Self-pay | Admitting: Internal Medicine

## 2024-07-10 ENCOUNTER — Observation Stay (HOSPITAL_COMMUNITY)
Admission: EM | Admit: 2024-07-10 | Discharge: 2024-07-19 | DRG: 065 | Disposition: A | Discharge status: Rehabilitation Facility | Attending: Internal Medicine | Admitting: Internal Medicine

## 2024-07-10 DIAGNOSIS — E119 Type 2 diabetes mellitus without complications: Secondary | ICD-10-CM | POA: Diagnosis not present

## 2024-07-10 DIAGNOSIS — E876 Hypokalemia: Secondary | ICD-10-CM | POA: Insufficient documentation

## 2024-07-10 DIAGNOSIS — R058 Other specified cough: Secondary | ICD-10-CM | POA: Diagnosis not present

## 2024-07-10 DIAGNOSIS — M25462 Effusion, left knee: Secondary | ICD-10-CM | POA: Insufficient documentation

## 2024-07-10 DIAGNOSIS — I10 Essential (primary) hypertension: Secondary | ICD-10-CM | POA: Diagnosis not present

## 2024-07-10 DIAGNOSIS — E785 Hyperlipidemia, unspecified: Secondary | ICD-10-CM | POA: Diagnosis present

## 2024-07-10 DIAGNOSIS — R059 Cough, unspecified: Secondary | ICD-10-CM | POA: Clinically undetermined

## 2024-07-10 DIAGNOSIS — G8191 Hemiplegia, unspecified affecting right dominant side: Secondary | ICD-10-CM

## 2024-07-10 DIAGNOSIS — I69391 Dysphagia following cerebral infarction: Secondary | ICD-10-CM

## 2024-07-10 DIAGNOSIS — F17210 Nicotine dependence, cigarettes, uncomplicated: Secondary | ICD-10-CM

## 2024-07-10 DIAGNOSIS — I639 Cerebral infarction, unspecified: Principal | ICD-10-CM | POA: Diagnosis present

## 2024-07-10 DIAGNOSIS — F1010 Alcohol abuse, uncomplicated: Secondary | ICD-10-CM

## 2024-07-10 DIAGNOSIS — F4323 Adjustment disorder with mixed anxiety and depressed mood: Secondary | ICD-10-CM | POA: Diagnosis not present

## 2024-07-10 DIAGNOSIS — I6389 Other cerebral infarction: Secondary | ICD-10-CM

## 2024-07-10 DIAGNOSIS — M25562 Pain in left knee: Secondary | ICD-10-CM | POA: Insufficient documentation

## 2024-07-10 DIAGNOSIS — F17200 Nicotine dependence, unspecified, uncomplicated: Secondary | ICD-10-CM | POA: Insufficient documentation

## 2024-07-10 DIAGNOSIS — F1090 Alcohol use, unspecified, uncomplicated: Secondary | ICD-10-CM | POA: Diagnosis present

## 2024-07-10 LAB — COMPREHENSIVE METABOLIC PANEL WITH GFR
ALT: 13 U/L (ref 0–44)
AST: 16 U/L (ref 15–41)
Albumin: 3.5 g/dL (ref 3.5–5.0)
Alkaline Phosphatase: 77 U/L (ref 38–126)
Anion gap: 13 (ref 5–15)
BUN: 9 mg/dL (ref 8–23)
CO2: 24 mmol/L (ref 22–32)
Calcium: 8.6 mg/dL — ABNORMAL LOW (ref 8.9–10.3)
Chloride: 105 mmol/L (ref 98–111)
Creatinine, Ser: 0.59 mg/dL (ref 0.44–1.00)
GFR, Estimated: >60 mL/min (ref >60)
Glucose, Bld: 218 mg/dL — ABNORMAL HIGH (ref 70–99)
Potassium: 3.4 mmol/L — ABNORMAL LOW (ref 3.5–5.1)
Sodium: 142 mmol/L (ref 135–145)
Total Bilirubin: 1.9 mg/dL — ABNORMAL HIGH (ref 0.0–1.2)
Total Protein: 6.2 g/dL — ABNORMAL LOW (ref 6.5–8.1)

## 2024-07-10 LAB — HIV ANTIBODY (ROUTINE TESTING W REFLEX): HIV Screen 4th Generation wRfx: NONREACTIVE

## 2024-07-10 LAB — DIFFERENTIAL
Abs Immature Granulocytes: 0.01 K/uL (ref 0.00–0.07)
Basophils Absolute: 0 K/uL (ref 0.0–0.1)
Basophils Relative: 0 %
Eosinophils Absolute: 0 K/uL (ref 0.0–0.5)
Eosinophils Relative: 0 %
Immature Granulocytes: 0 %
Lymphocytes Relative: 28 %
Lymphs Abs: 2 K/uL (ref 0.7–4.0)
Monocytes Absolute: 0.6 K/uL (ref 0.1–1.0)
Monocytes Relative: 8 %
Neutro Abs: 4.4 K/uL (ref 1.7–7.7)
Neutrophils Relative %: 64 %

## 2024-07-10 LAB — I-STAT CHEM 8, ED
BUN: 7 mg/dL — ABNORMAL LOW (ref 8–23)
Calcium, Ion: 0.99 mmol/L — ABNORMAL LOW (ref 1.15–1.40)
Chloride: 106 mmol/L (ref 98–111)
Creatinine, Ser: 0.6 mg/dL (ref 0.44–1.00)
Glucose, Bld: 205 mg/dL — ABNORMAL HIGH (ref 70–99)
HCT: 46 % (ref 36.0–46.0)
Hemoglobin: 15.6 g/dL — ABNORMAL HIGH (ref 12.0–15.0)
Potassium: 3.1 mmol/L — ABNORMAL LOW (ref 3.5–5.1)
Sodium: 143 mmol/L (ref 135–145)
TCO2: 23 mmol/L (ref 22–32)

## 2024-07-10 LAB — CBC
HCT: 44.1 % (ref 36.0–46.0)
Hemoglobin: 14.5 g/dL (ref 12.0–15.0)
MCH: 32.5 pg (ref 26.0–34.0)
MCHC: 32.9 g/dL (ref 30.0–36.0)
MCV: 98.9 fL (ref 80.0–100.0)
Platelets: 255 K/uL (ref 150–400)
RBC: 4.46 MIL/uL (ref 3.87–5.11)
RDW: 12.5 % (ref 11.5–15.5)
WBC: 7 K/uL (ref 4.0–10.5)
nRBC: 0 % (ref 0.0–0.2)

## 2024-07-10 LAB — APTT: aPTT: 28 s (ref 24–36)

## 2024-07-10 LAB — CBG MONITORING, ED
Glucose-Capillary: 250 mg/dL — ABNORMAL HIGH (ref 70–99)
Glucose-Capillary: 202 mg/dL — ABNORMAL HIGH (ref 70–99)

## 2024-07-10 LAB — GLUCOSE, CAPILLARY: Glucose-Capillary: 161 mg/dL — ABNORMAL HIGH (ref 70–99)

## 2024-07-10 LAB — FOLATE: Folate: 14.5 ng/mL (ref >5.9)

## 2024-07-10 LAB — ETHANOL: Alcohol, Ethyl (B): <15 mg/dL (ref <15)

## 2024-07-10 LAB — VITAMIN B12: Vitamin B-12: 188 pg/mL (ref 180–914)

## 2024-07-10 LAB — TSH: TSH: 0.964 u[IU]/mL (ref 0.350–4.500)

## 2024-07-10 LAB — CK: Total CK: 172 U/L (ref 38–234)

## 2024-07-10 LAB — PROTIME-INR
INR: 1 (ref 0.8–1.2)
Prothrombin Time: 14 s (ref 11.4–15.2)

## 2024-07-10 LAB — HEMOGLOBIN A1C
Hgb A1c MFr Bld: 9.5 % — ABNORMAL HIGH (ref 4.8–5.6)
Mean Plasma Glucose: 226 mg/dL

## 2024-07-10 MED ORDER — LIDOCAINE 5 % EX PTCH
1.0000 | MEDICATED_PATCH | Freq: Every day | CUTANEOUS | Status: DC
  Administered 2024-07-11 – 2024-07-19 (×9): 1 via TRANSDERMAL
  Filled 2024-07-10 (×10): qty 1

## 2024-07-10 MED ORDER — SODIUM CHLORIDE 0.9% FLUSH
3.0000 mL | Freq: Once | INTRAVENOUS | Status: AC
  Administered 2024-07-10: 3 mL via INTRAVENOUS

## 2024-07-10 MED ORDER — ONDANSETRON HCL 4 MG/2ML IJ SOLN: 4.0000 mg | Freq: Once | INTRAMUSCULAR | Status: AC

## 2024-07-10 MED ORDER — DAPAGLIFLOZIN PROPANEDIOL 10 MG PO TABS
10.0000 mg | ORAL_TABLET | Freq: Every day | ORAL | Status: DC
  Administered 2024-07-10: 10 mg via ORAL
  Filled 2024-07-10: qty 1

## 2024-07-10 MED ORDER — LORAZEPAM 2 MG/ML IJ SOLN
0.5000 mg | Freq: Once | INTRAMUSCULAR | Status: AC
  Administered 2024-07-10: 0.5 mg via INTRAVENOUS
  Filled 2024-07-10: qty 1

## 2024-07-10 MED ORDER — NICOTINE 21 MG/24HR TD PT24
21.0000 mg | MEDICATED_PATCH | Freq: Every day | TRANSDERMAL | Status: DC
  Administered 2024-07-10 – 2024-07-19 (×8): 21 mg via TRANSDERMAL
  Filled 2024-07-10 (×10): qty 1

## 2024-07-10 MED ORDER — ONDANSETRON HCL 4 MG/2ML IJ SOLN
INTRAMUSCULAR | Status: AC
  Administered 2024-07-10: 4 mg via INTRAVENOUS
  Filled 2024-07-10: qty 2

## 2024-07-10 MED ORDER — ASPIRIN 81 MG PO TBEC
81.0000 mg | DELAYED_RELEASE_TABLET | Freq: Every day | ORAL | Status: DC
  Administered 2024-07-10: 81 mg via ORAL
  Filled 2024-07-10 (×2): qty 1

## 2024-07-10 MED ORDER — THIAMINE MONONITRATE 100 MG PO TABS
100.0000 mg | ORAL_TABLET | Freq: Every day | ORAL | Status: DC
  Administered 2024-07-10 – 2024-07-19 (×9): 100 mg via ORAL
  Filled 2024-07-10 (×10): qty 1

## 2024-07-10 MED ORDER — CLOPIDOGREL BISULFATE 75 MG PO TABS: 75.0000 mg | ORAL_TABLET | Freq: Every day | ORAL | Status: DC

## 2024-07-10 MED ORDER — ACETAMINOPHEN 500 MG PO TABS: 500.0000 mg | ORAL_TABLET | Freq: Four times a day (QID) | ORAL | Status: DC | PRN

## 2024-07-10 MED ORDER — FOLIC ACID 1 MG PO TABS
1.0000 mg | ORAL_TABLET | Freq: Every day | ORAL | Status: DC
  Administered 2024-07-10 – 2024-07-19 (×9): 1 mg via ORAL
  Filled 2024-07-10 (×10): qty 1

## 2024-07-10 MED ORDER — CLOPIDOGREL BISULFATE 75 MG PO TABS
75.0000 mg | ORAL_TABLET | Freq: Every day | ORAL | Status: DC
  Administered 2024-07-10 – 2024-07-19 (×9): 75 mg via ORAL
  Filled 2024-07-10 (×10): qty 1

## 2024-07-10 MED ORDER — INSULIN ASPART 100 UNIT/ML IJ SOLN
0.0000 [IU] | Freq: Every day | INTRAMUSCULAR | Status: DC
  Administered 2024-07-13 – 2024-07-14 (×2): 3 [IU] via SUBCUTANEOUS
  Administered 2024-07-15: 2 [IU] via SUBCUTANEOUS
  Administered 2024-07-16: 3 [IU] via SUBCUTANEOUS
  Administered 2024-07-17: 4 [IU] via SUBCUTANEOUS
  Administered 2024-07-18: 2 [IU] via SUBCUTANEOUS
  Filled 2024-07-10: qty 2
  Filled 2024-07-10: qty 3
  Filled 2024-07-10: qty 2
  Filled 2024-07-10: qty 4
  Filled 2024-07-10: qty 3
  Filled 2024-07-10 (×2): qty 5

## 2024-07-10 MED ORDER — EZETIMIBE 10 MG PO TABS
10.0000 mg | ORAL_TABLET | Freq: Every day | ORAL | Status: DC
  Administered 2024-07-10 – 2024-07-19 (×9): 10 mg via ORAL
  Filled 2024-07-10 (×10): qty 1

## 2024-07-10 MED ORDER — THIAMINE HCL 100 MG/ML IJ SOLN
100.0000 mg | Freq: Every day | INTRAMUSCULAR | Status: DC
  Administered 2024-07-11: 100 mg via INTRAVENOUS
  Filled 2024-07-10: qty 2

## 2024-07-10 MED ORDER — POTASSIUM CHLORIDE CRYS ER 20 MEQ PO TBCR
40.0000 meq | EXTENDED_RELEASE_TABLET | Freq: Once | ORAL | Status: AC
  Administered 2024-07-10: 40 meq via ORAL
  Filled 2024-07-10: qty 2

## 2024-07-10 MED ORDER — IPRATROPIUM-ALBUTEROL 0.5-2.5 (3) MG/3ML IN SOLN: 3.0000 mL | RESPIRATORY_TRACT | Status: DC | PRN

## 2024-07-10 MED ORDER — ENOXAPARIN SODIUM 40 MG/0.4ML IJ SOSY
40.0000 mg | PREFILLED_SYRINGE | INTRAMUSCULAR | Status: DC
  Administered 2024-07-10 – 2024-07-13 (×4): 40 mg via SUBCUTANEOUS
  Filled 2024-07-10 (×5): qty 0.4

## 2024-07-10 MED ORDER — IOHEXOL 350 MG/ML SOLN
75.0000 mL | Freq: Once | INTRAVENOUS | Status: AC | PRN
  Administered 2024-07-10: 75 mL via INTRAVENOUS

## 2024-07-10 MED ORDER — ATORVASTATIN CALCIUM 80 MG PO TABS
80.0000 mg | ORAL_TABLET | Freq: Every day | ORAL | Status: DC
  Administered 2024-07-10 – 2024-07-17 (×8): 80 mg via ORAL
  Filled 2024-07-10 (×8): qty 1

## 2024-07-10 MED ORDER — PANTOPRAZOLE SODIUM 40 MG PO TBEC
40.0000 mg | DELAYED_RELEASE_TABLET | Freq: Every day | ORAL | Status: DC
  Administered 2024-07-10 – 2024-07-16 (×6): 40 mg via ORAL
  Filled 2024-07-10 (×8): qty 1

## 2024-07-10 MED ORDER — ADULT MULTIVITAMIN W/MINERALS CH
1.0000 | ORAL_TABLET | Freq: Every day | ORAL | Status: DC
  Administered 2024-07-10 – 2024-07-19 (×9): 1 via ORAL
  Filled 2024-07-10 (×10): qty 1

## 2024-07-10 MED ORDER — INSULIN ASPART 100 UNIT/ML IJ SOLN
0.0000 [IU] | Freq: Three times a day (TID) | INTRAMUSCULAR | Status: DC
  Administered 2024-07-10: 2 [IU] via SUBCUTANEOUS
  Administered 2024-07-11 (×3): 3 [IU] via SUBCUTANEOUS
  Administered 2024-07-12: 8 [IU] via SUBCUTANEOUS
  Administered 2024-07-12: 2 [IU] via SUBCUTANEOUS
  Administered 2024-07-12 – 2024-07-13 (×3): 3 [IU] via SUBCUTANEOUS
  Administered 2024-07-13 – 2024-07-14 (×3): 5 [IU] via SUBCUTANEOUS
  Administered 2024-07-14: 2 [IU] via SUBCUTANEOUS
  Administered 2024-07-15 (×2): 3 [IU] via SUBCUTANEOUS
  Administered 2024-07-15: 8 [IU] via SUBCUTANEOUS
  Administered 2024-07-16: 5 [IU] via SUBCUTANEOUS
  Administered 2024-07-16: 3 [IU] via SUBCUTANEOUS
  Administered 2024-07-16: 8 [IU] via SUBCUTANEOUS
  Administered 2024-07-17: 2 [IU] via SUBCUTANEOUS
  Administered 2024-07-17: 11 [IU] via SUBCUTANEOUS
  Administered 2024-07-17: 5 [IU] via SUBCUTANEOUS
  Administered 2024-07-18: 2 [IU] via SUBCUTANEOUS
  Administered 2024-07-18 (×2): 3 [IU] via SUBCUTANEOUS
  Administered 2024-07-19: 8 [IU] via SUBCUTANEOUS
  Administered 2024-07-19: 2 [IU] via SUBCUTANEOUS
  Filled 2024-07-10: qty 2
  Filled 2024-07-10: qty 5
  Filled 2024-07-10: qty 8
  Filled 2024-07-10: qty 11
  Filled 2024-07-10: qty 8
  Filled 2024-07-10 (×2): qty 3
  Filled 2024-07-10: qty 5
  Filled 2024-07-10 (×2): qty 3
  Filled 2024-07-10: qty 5
  Filled 2024-07-10 (×2): qty 8
  Filled 2024-07-10: qty 3
  Filled 2024-07-10: qty 4
  Filled 2024-07-10: qty 3
  Filled 2024-07-10: qty 5
  Filled 2024-07-10: qty 2
  Filled 2024-07-10 (×2): qty 3
  Filled 2024-07-10: qty 2
  Filled 2024-07-10: qty 5
  Filled 2024-07-10: qty 2
  Filled 2024-07-10: qty 3
  Filled 2024-07-10: qty 5

## 2024-07-10 NOTE — Code Documentation (Signed)
 Stroke Response Nurse Documentation Code Documentation  Darlene Pugh is a 63 y.o. female arriving to [facility]  via [facility] on 07/10/2024 with past medical hx of DM HTN previous stroke. On aspirin  81 mg daily and Brilinta  (ticagrelor ) 90 mg bid. Code stroke was activated by EMS.   Patient from home where she was LKW at 1100 AM yesterday and now complaining of right facial droop and dysarthria.  Stroke team at the bedside on patient arrival. Labs drawn and patient cleared for CT by Dr. Ginger. Patient to CT with team. NIHSS 6, see documentation for details and code stroke times. Patient with right facial droop, bilateral leg weakness, left decreased sensation, and dysarthria  on exam. The following imaging was completed:  CT Head and CTA. Patient is not a candidate for IV Thrombolytic due to she is outside of the treatment window. Patient is not a candidate for IR due to LVO negative.   Care Plan:    No acute treatment/TIA alert: q2h x 12 hours NIHSS & VS, then q4h  NPO until stroke swallow screen complete.   Bedside handoff with ED RN Carlyon.    Betzalel Umbarger Livengood  Stroke Response RN

## 2024-07-10 NOTE — H&P (Signed)
 Date: 07/10/2024               Patient Name:  Darlene Pugh MRN: [id number]  DOB: 03/16/1961 Age / Sex: 63 y.o., female   PCP: Earvin Johnston PARAS, FNP         Medical Service: Internal Medicine Teaching Service         Attending Physician: Dr. Dreama Longs, MD      First Contact: Intern on Call: [phone]       Second Contact: Resident on [phone]                    SUBJECTIVE   Chief Complaint: Falling  History of Present Illness: Darlene Pugh is a 63 year old female with a past medical history of diabetes, hypertension, vitamin D deficiency, osteoarthritis who presented today after being found down at her home.  She reports that last night she was sitting on the couch and had taking a blood pressure pill (lisinopril  40) because she felt as if she had been taking blood pressure medicine for quite some time.  She was also drinking a can of beer, but denies any further use of alcohol or illicit drugs.  After a while she got up to use the restroom and felt as if both of her legs gave out and had no strength in them.  She then sat on the ground and crawled to the bedroom where she stayed all night.  This morning she called her daughter who then called 911.  She reports that she did hit her head slightly but did not lose consciousness and does not have a headache this afternoon. She reports that this has never happened before but that she feels overall weak.  She does not feel particularly better than she did last night and is still having significant weakness in the bilateral lower extremities.  She does have noticeable dysarthria during her conversation but is alert and oriented.  She reports this has never happened before and she does not have a history of neurological disorders but states that she might of had a small stroke a couple of months ago.  Per chart review she was seen in urgent care for paresthesias with no stroke found.  She denies fever, chills, headache,  chest pain, shortness of breath, abdominal pain, dysuria, or peripheral edema.  Review of Systems: A complete ROS was negative except as per HPI.   ED Course:  Past Medical History: HTN T2DM Osteoarthritis Vitamin D deficiency  Meds:  Last time she took medications was one month ago:   Allergies: Allergies as of 07/10/2024   (No Known Allergies)    Past Surgical History:  Procedure Laterality Date   LEFT HEART CATH AND CORONARY ANGIOGRAPHY N/A 08/02/2017   Procedure: LEFT HEART CATH AND CORONARY ANGIOGRAPHY;  Surgeon: Levern Hutching, MD;  Location: [facility];  Service: Cardiovascular;  Laterality: N/A;    Social:  Lives With:Alone Occupation:Not currently  Level of Function:Independent  ERE:Bjbj, Johnston PARAS, FNP  Substances: -Tobacco: 1/2 pack of Cigarillos per day  -Alcohol:2-3 glasses of liquor a day -Drugs: none   Family History:  No family history on file.    OBJECTIVE:   Physical Exam: Blood pressure 137/89, pulse 87, temperature 98 F (36.7 C), temperature source Oral, resp. rate 17, height 5' 4 (1.626 m), weight 76.2 kg, SpO2 98%.  Constitutional: well-appearing female, in no acute distress HENT: normocephalic atraumatic, mucous membranes moist Cardiovascular: regular rate and rhythm, no  m/r/g, no JVD Pulmonary/Chest: normal work of breathing on room air, crackles noted diffusely bilaterally Abdominal: soft, non-tender, non-distended.  Neurological: alert & oriented to self, situation, and place. CN II-XII intact. RUE 4/5 in all myotomes. RLE 3/5 to hip flexion. Right facial droop noted. Dysarthric  MSK: no gross abnormalities. No pitting edema Skin: warm and dry Psych: Normal mood and affect  Labs:    Latest Ref Rng & Units 07/10/2024   10:21 AM 07/10/2024   10:13 AM 08/03/2017    6:54 AM  CBC  WBC 4.0 - 10.5 K/uL  7.0  7.8   Hemoglobin 12.0 - 15.0 g/dL 84.3  85.4  87.4   Hematocrit 36.0 - 46.0 % 46.0  44.1  37.4   Platelets 150 - 400  K/uL  255  250        Latest Ref Rng & Units 07/10/2024   10:21 AM 07/10/2024   10:13 AM 08/03/2017    6:54 AM  CMP  Glucose 70 - 99 mg/dL 794  781  876   BUN 8 - 23 mg/dL 7  9  6    Creatinine 0.44 - 1.00 mg/dL 9.39  9.40  9.26   Sodium 135 - 145 mmol/L 143  142  139   Potassium 3.5 - 5.1 mmol/L 3.1  3.4  3.7   Chloride 98 - 111 mmol/L 106  105  107   CO2 22 - 32 mmol/L  24  24   Calcium  8.9 - 10.3 mg/dL  8.6  9.1   Total Protein 6.5 - 8.1 g/dL  6.2    Total Bilirubin 0.0 - 1.2 mg/dL  1.9    Alkaline Phos 38 - 126 U/L  77    AST 15 - 41 U/L  16    ALT 0 - 44 U/L  13        Imaging: MR BRAIN WO CONTRAST Result Date: 07/10/2024 EXAM: MRI BRAIN WITHOUT CONTRAST 07/10/2024 12:34:22 PM TECHNIQUE: Multiplanar multisequence MRI of the head/brain was performed without the administration of intravenous contrast. COMPARISON: Head CT and CTA 07/10/2024. CLINICAL HISTORY: Neuro deficit, acute, stroke suspected. Right sided weakness and facial droop. FINDINGS: BRAIN AND VENTRICLES: There is an acute infarct involving the posterior limb of the left internal capsule and left lentiform nucleus. Chronic lacunar infarcts are present in the basal ganglia bilaterally and in the left thalamus. No intracranial hemorrhage, mass, midline shift, hydrocephalus, or extra axial fluid collection is identified. T2 hyperintensities in the cerebral white matter bilaterally are nonspecific but compatible with mild chronic small vessel ischemic disease. Cerebral volume is within normal limits for age. Major intracranial vascular flow voids are preserved. ORBITS: No acute abnormality. SINUSES AND MASTOIDS: Small mucous retention cysts in the maxillary sinuses. Clear mastoid air cells. BONES AND SOFT TISSUES: Normal marrow signal. No acute soft tissue abnormality. IMPRESSION: 1. Acute infarct involving the left internal capsule/basal ganglia. 2. Chronic small vessel ischemic disease with multiple chronic lacunar infarcts.  Electronically signed by: Dasie Hamburg MD 07/10/2024 12:57 PM EST RP Workstation: [id number]   CT HEAD CODE STROKE WO CONTRAST Addendum Date: 07/10/2024 ** ADDENDUM #1 ** ADDENDUM: The findings were discussed with Dr. Vanessa at 11:15 am 07/10/24. I was unable to obtain his contact information at the time of the report. ---------------------------------------------------- Electronically signed by: Evalene Coho MD 07/10/2024 11:20 AM EST RP Workstation: [id number]   Result Date: 07/10/2024 ** ORIGINAL REPORT ** EXAM: CT HEAD WITHOUT CONTRAST 07/10/2024 10:22:12 AM TECHNIQUE: CT of the head was performed  without the administration of intravenous contrast. Automated exposure control, iterative reconstruction, and/or weight based adjustment of the mA/kV was utilized to reduce the radiation dose to as low as reasonably achievable. COMPARISON: CT of the head dated 11/19/2008. CLINICAL HISTORY: Neuro deficit, acute, stroke suspected. FINDINGS: BRAIN AND VENTRICLES: No acute hemorrhage. No evidence of acute infarct. Remote basal ganglia lacunar infarcts. Partially empty sella. No hydrocephalus. No extra-axial collection. No mass effect or midline shift. Calcific atherosclerosis. [place] Stroke Program Early CT Score (ASPECTS): Ganglionic (caudate, internal capsule, lentiform nucleus, insula, M1-M3): 7 Supraganglionic (M4-M6): 3 Total: 10 ORBITS: No acute abnormality. SINUSES: Maxillary sinus retention cysts. SOFT TISSUES AND SKULL: No acute soft tissue abnormality. No skull fracture. IMPRESSION: 1. No acute intracranial abnormality related to the suspected stroke. 2. ASPECTS: 10. Electronically signed by: Evalene Coho MD 07/10/2024 10:42 AM EST RP Workstation: [id number]   CT ANGIO HEAD NECK W WO CM (CODE STROKE) Result Date: 07/10/2024 EXAM: CTA HEAD AND NECK WITH AND WITHOUT 07/10/2024 10:30:18 AM TECHNIQUE: CTA of the head and neck was performed with and without the administration of 75 mL of iohexol  (OMNIPAQUE) 350 MG/ML injection. Multiplanar 2D and/or 3D reformatted images are provided for review. Automated exposure control, iterative reconstruction, and/or weight based adjustment of the mA/kV was utilized to reduce the radiation dose to as low as reasonably achievable. Stenosis of the internal carotid arteries measured using NASCET criteria. COMPARISON: None available CLINICAL HISTORY: Neuro deficit, acute, stroke suspected. FINDINGS: CTA NECK: AORTIC ARCH AND ARCH VESSELS: Moderate calcific plaque within the aortic arch. No dissection or arterial injury. No significant stenosis of the brachiocephalic or subclavian arteries. CERVICAL CAROTID ARTERIES: Calcific plaque within the carotid bulbs bilaterally with less than 10% luminal stenosis bilaterally. The cervical segments of both internal carotid arteries are tortuous. No dissection, arterial injury, or hemodynamically significant stenosis by NASCET criteria. CERVICAL VERTEBRAL ARTERIES: No dissection, arterial injury, or significant stenosis. LUNGS AND MEDIASTINUM: Unremarkable. SOFT TISSUES: No acute abnormality. BONES: No acute abnormality. CTA HEAD: ANTERIOR CIRCULATION: Internal Carotid Arteries: Moderate-to-severe stenosis of the ascending petrous segment of the right internal carotid artery. Moderate stenosis of the supraclinoid segment of the right internal carotid artery. Mild-to-moderate calcific atheromatous disease and stenosis of the petrous and cavernous segments of the left internal carotid artery. Anterior Cerebral Arteries: The right A1 segment is diminutive or absent. No significant stenosis of the left anterior cerebral artery. Middle Cerebral Arteries: The M1 segments are mildly irregular in contour. No significant stenosis of the middle cerebral arteries. Large Vessel Occlusion: There is no evidence of emergent large vessel occlusion. Aneurysm: No aneurysm. POSTERIOR CIRCULATION: Posterior Cerebral Arteries: Mild irregular stenosis of  the P1 segments of the posterior cerebral arteries bilaterally. Basilar Artery: No significant stenosis of the basilar artery. Vertebral Arteries: No significant stenosis of the vertebral arteries. Aneurysm: No aneurysm. OTHER: No dural venous sinus thrombosis on this non-dedicated study. IMPRESSION: 1. No evidence of emergent large vessel occlusion. 2. 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 3. Mild-to-moderate calcific atheromatous disease and stenosis of the petrous and cavernous segments of the left internal carotid artery. 4. Mild irregular stenosis of the P1 segments of the posterior cerebral arteries bilaterally. Electronically signed by: Evalene Coho MD 07/10/2024 10:53 AM EST RP Workstation: [id number]      EKG: personally reviewed my interpretation is Sinus rhythm. Prior EKG Sinus bradycardia  ASSESSMENT & PLAN:   Assessment & Plan by Problem: Active  Problems:   * No active hospital problems. *   Alizey Lawanda Malicki is a 64 y.o. person living with a history of type 2 diabetes, hypertension, osteoarthritis, vitamin D deficiency who presented with right-sided weakness after being found down at home and admitted for CVA on hospital day 0  Acute left internal capsule/basal ganglia stroke  Encephalopathy -The patient presented to the ER today after experiencing sudden lower extremity weakness and being unable to get up since last night.  - MRI with acute infarct involving left internal capsule/basal ganglia - Patient continues to have persistent right-sided deficits including right facial droop and weakness in the upper and lower extremities. - She is also having notable dysphagia and dysarthria on exam. - Of note, she is prescribed brilinta  however does not take regularly - Neurology consulted appreciate their recommendations. - TTE pending - Lipid panel pending -  Continue aspirin  and Plavix  - Permissive hypertension through this evening. - PT/OT/SLP - Remain NPO until SLP eval  Productive Cough Concern for aspiration -The patient is having a notably productive cough. Per her and her family's history, this is abnormal and she does have a history of asthma, however no known COPD or ILD.  - On exam, notable crackles diffusely. - At this time, due to CVA and risk for dysphagia, there is concern for aspiration. Will obtain CXR and keep pt NPO until evaluated by SLP. -RVP pending -CXR pending   Alcohol Use Disorder -The patient reports drinking 3-4 glasses of liquor per day as well as some beer -She denies withdrawal in the past -CIWA protocol to monitor for potential withdrawal  HTN -The patient is on Lisinopril  40mg  and Metoprolol  Succinate 100mg . She reports that she only occasionally takes the medications as she does not really like taking medicines. -Hold home antihypertensives in setting of permissive hypertension through tonight (12/1)  T2DM -last A1c 9.2. -She is prescribe Farxiga, however reports that she doesn't take it regularly  Nicotine  Dependence -The patient reports that she smokes 7-8 cigarellos per day.  -Continue to counsel on nicotine  cessation -Nicotine  patch ordered.    Diet: NPO VTE: Enoxaparin Code: Full  Prior to Admission Living Arrangement: Home, living alone Anticipated Discharge Location: Pending Barriers to Discharge: clinical improvement  Dispo: Admit patient to Observation with expected length of stay less than 2 midnights.  Signed:   Schuyler Novak, DO Internal Medicine Teaching Service, PGY-1 Please call on-call pager [phone]

## 2024-07-10 NOTE — ED Notes (Signed)
 Pt alert, NAD, calm, interactive, skin W&D, resps e/u, altered speech, A&Ox4, MAEx4, intermittent cough, with post tussive emesis. Endorses nausea. Zofran  given. Pending MRI. Family x2 at [facility].

## 2024-07-10 NOTE — ED Notes (Signed)
MRI sending for pt

## 2024-07-10 NOTE — ED Notes (Signed)
 Patient transported to MRI

## 2024-07-10 NOTE — Progress Notes (Signed)
 RT instructed patient and family on the use of a flutter valve. Patient able to demonstrate back good technique with coaching and had a productive cough with clear frothy secretions.

## 2024-07-10 NOTE — TOC CM/SW Note (Signed)
 TOC consult received for substance abuse. SW to f/u with patient as appropriate.   Merilee Batty, MSN, RN Case Management [phone]

## 2024-07-10 NOTE — Progress Notes (Signed)
 Notified by nursing that the patient was complaining of worsening vision and possible worsening weakness that was later clarified as stable but distressing that it was not improving.  We went to see the patient who again confirmed that subjectively she continues to have right-sided deficits and trouble speaking but that it is not worsening.  She was unable to describe the changes in her vision and then said that her vision had not changed but as above she was just worried that nothing was getting better.  Physical exam remained stable from admission based on signout and review of admission note and neurology consult note.  Based on this we did not call code stroke and no changes to the plan were made.  Please see physical exam below.  Please page the number at the bottom of this note for any new or worsening deficits.    Physical Exam Gen: A&Ox4, NAD Resp:  normal work of breathing CV: extremities appear well-perfused. Extrem: Nml bulk; no cyanosis, clubbing, or edema.  Neuro: *MS: A&O x4. Follows multi-step commands.  *Speech: Dysarthria present, able to name and repeat. *CN:    I: Deferred   II,III: PERRLA, VFF by confrontation, optic discs not visualized 2/2 pupillary constriction   III,IV,VI: EOMI w/o nystagmus, no ptosis   V: Sensation intact from V1 to V3 to LT   VII: Eyelid closure was full.  Right-sided facial droop   VIII: Hearing intact to voice   IX,X: Voice normal, palate elevates symmetrically    XI: trap 5/5 bilat   XII: Tongue protrudes midline, no atrophy or fasciculations  *Motor:   Normal bulk.  No tremor, rigidity or bradykinesia. No pronator drift.   Strength: Dlt Bic Tri WE WrF FgS Gr HF KnF KnE PlF DoF    Left 5 5 5 5 5 5 5 5   5 5     Right 4 4 4 4 4 4 4 3   3 3    *Sensory: Intact to light touch, pinprick, temperature vibration throughout. Symmetric. Propioception intact bilat.  No double-simultaneous extinction.  *Gait: deferred  Fairy Pool, DO Internal  Medicine Resident, PGY-3 Please contact the on call pager at [phone] for any urgent or emergent needs. 9:56 PM 07/10/2024

## 2024-07-10 NOTE — ED Triage Notes (Signed)
 Pt BIB [facility] from home management found pt on ground.EMS reports pt had 2 falls yesterday hit her right head on table second fall was in the bathroom , pt did not have a syncopal episode, pt is not on any blood thinners does had a hx of stroke(permeant left sided weakness ). Upon ems arrival  new deficits on right side(per ems) , and new onset of dysarthria and right facial weakness ,LVO3 , family states last known well 11 am yesterday BP 174/106 96 HR CBG 191 RR 22 18  LT AC

## 2024-07-10 NOTE — Consult Note (Addendum)
 NEUROLOGY CONSULT NOTE   Date of service: July 10, 2024 Patient Name: Darlene Pugh MRN:  [id number] DOB:  10-30-1960 Chief Complaint: Code Stroke- right facial droop, dysarthria Requesting Provider: Dreama Longs, MD  History of Present Illness  Darlene Pugh is a 63 y.o. female with hx of DM, HTN,, previous stroke with residual eft facial numbness who was found on the ground by her investment banker, corporate. She reports 2 fall yesterday afternoon and does endorse hitting her head. She spoke to her sister yesterday around 49 and was in her usual state of health with no dysarthria. Code stroke activated due to facial droop, dysarthria, and weakness. BP on arrival 160/99, glucose 250.  LKW: 1100 11/30  Modified rankin score: 1-No significant post stroke disability and can perform usual duties with stroke symptoms IV Thrombolysis: No, outside of window EVT: No, no LVO   NIHSS components Score: Comment  1a Level of Conscious 0[]  1[]  2[]  3[]      1b LOC Questions 0[]  1[]  2[]       1c LOC Commands 0[]  1[]  2[]       2 Best Gaze 0[]  1[]  2[]       3 Visual 0[]  1[]  2[]  3[]      4 Facial Palsy 0[]  1[]  2[x]  3[]      5a Motor Arm - left 0[]  1[]  2[]  3[]  4[]  UN[]    5b Motor Arm - Right 0[]  1[]  2[]  3[]  4[]  UN[]    6a Motor Leg - Left 0[]  1[x]  2[]  3[]  4[]  UN[]    6b Motor Leg - Right 0[]  1[x]  2[]  3[]  4[]  UN[]    7 Limb Ataxia 0[]  1[]  2[]  UN[]      8 Sensory 0[]  1[x]  2[]  UN[]      9 Best Language 0[]  1[]  2[]  3[]      10 Dysarthria 0[]  1[x]  2[]  UN[]      11 Extinct. and Inattention 0[]  1[]  2[]       TOTAL:6       ROS  Comprehensive ROS performed and pertinent positives documented in HPI   Past History   Past Medical History:  Diagnosis Date   Diabetes mellitus without complication (HCC)    Hypertension     Past Surgical History:  Procedure Laterality Date   LEFT HEART CATH AND CORONARY ANGIOGRAPHY N/A 08/02/2017   Procedure: LEFT HEART CATH AND CORONARY ANGIOGRAPHY;  Surgeon:  Levern Hutching, MD;  Location: [facility];  Service: Cardiovascular;  Laterality: N/A;    Family History: No family history on file.  Social History  reports that she has been smoking cigarettes. She has never used smokeless tobacco. She reports that she does not drink alcohol and does not use drugs.  No Known Allergies  Medications   Current Facility-Administered Medications:    LORazepam (ATIVAN) injection 0.5 mg, 0.5 mg, Intravenous, Once, Dreama Longs, MD  Current Outpatient Medications:    aspirin  EC 81 MG EC tablet, Take 1 tablet (81 mg total) by mouth daily., Disp: 30 tablet, Rfl: 3   atorvastatin  (LIPITOR ) 80 MG tablet, Take 1 tablet (80 mg total) by mouth daily at 6 PM., Disp: 30 tablet, Rfl: 3   FARXIGA 10 MG TABS tablet, Take 10 mg by mouth daily., Disp: , Rfl:    gabapentin (NEURONTIN) 100 MG capsule, Take 100 mg by mouth at bedtime. , Disp: , Rfl:    lidocaine  (LIDODERM ) 5 %, 1 patch daily., Disp: , Rfl:    lisinopril  (PRINIVIL ,ZESTRIL ) 20 MG tablet, Take 1 tablet (20 mg total) by mouth daily., Disp:  30 tablet, Rfl: 0   meloxicam  (MOBIC ) 7.5 MG tablet, Take 7.5 mg by mouth., Disp: , Rfl:    metFORMIN  (GLUCOPHAGE ) 500 MG tablet, Take 1 tablet (500 mg total) by mouth daily with breakfast., Disp: 30 tablet, Rfl: 0   metoprolol  succinate (TOPROL -XL) 100 MG 24 hr tablet, Take 100 mg by mouth., Disp: , Rfl:    metoprolol  tartrate (LOPRESSOR ) 25 MG tablet, Take 1 tablet (25 mg total) by mouth 2 (two) times daily., Disp: 60 tablet, Rfl: 3   nitroGLYCERIN  (NITROSTAT ) 0.4 MG SL tablet, Place 1 tablet (0.4 mg total) under the tongue every 5 (five) minutes x 3 doses as needed for chest pain., Disp: 25 tablet, Rfl: 12   pantoprazole  (PROTONIX ) 40 MG tablet, Take 1 tablet (40 mg total) by mouth daily., Disp: 30 tablet, Rfl: 3   QUEtiapine (SEROQUEL) 25 MG tablet, Take 25 mg by mouth at bedtime., Disp: , Rfl:    ticagrelor  (BRILINTA ) 90 MG TABS tablet, Take 1 tablet (90 mg  total) by mouth 2 (two) times daily., Disp: 60 tablet, Rfl: 3   VENTOLIN HFA 108 (90 Base) MCG/ACT inhaler, Inhale 2 puffs into the lungs every 6 (six) hours as needed., Disp: , Rfl:    Vitamin D, Ergocalciferol, (DRISDOL) 50000 units CAPS capsule, Take 50,000 Units by mouth every 7 (seven) days., Disp: , Rfl:   Vitals   Vitals:   2024-07-28 1030 07-28-24 1045 07-28-24 1047 2024/07/28 1052  BP: (!) 178/85 (!) 165/94    Pulse: 84 76    Resp:  16    Temp:    98 F (36.7 C)  TempSrc:    Oral  SpO2: 96% 99%    Weight:   76.2 kg   Height:   5' 4 (1.626 m)     Body mass index is 28.84 kg/m.   Physical Exam   Constitutional: Appears well-developed and well-nourished.  Psych: Affect appropriate to situation.  Eyes: No scleral injection.  HENT: No OP obstruction.  Head: Normocephalic.  Cardiovascular: Normal rate and regular rhythm.  Respiratory: Effort normal, non-labored breathing.  GI: Soft.  No distension. There is no tenderness.  Skin: WDI.   Neurologic Examination    Neuro: Mental Status: Patient is awake, alert, oriented to person, place, month, year, and situation. Speech is dysarthric No signs of aphasia or neglect Cranial Nerves: II: Visual Fields are full. Pupils are equal, round, and reactive to light.   III,IV, VI: EOMI without ptosis or diploplia.  V: Facial sensation is diminished on the left VII: Right facial asymmetry  VIII: Hearing is intact to voice X: Palate elevates symmetrically XI: Shoulder shrug is symmetric. XII: Tongue protrudes midline without atrophy or fasciculations.  Motor: Tone is normal. Bulk is normal.  Right hand grasp weaker than left Sensory: Sensation is symmetric to light touch and temperature in the arms and legs. No extinction to DSS present.  Cerebellar: FNF intact bilaterally, unable to complete HKS  Labs/Imaging/Neurodiagnostic studies   CBC:  Recent Labs  Lab 07-28-2024 1013 July 28, 2024 1021  WBC 7.0  --   NEUTROABS 4.4   --   HGB 14.5 15.6*  HCT 44.1 46.0  MCV 98.9  --   PLT 255  --    Basic Metabolic Panel:  Lab Results  Component Value Date   NA 143 07-28-2024   K 3.1 (L) 2024/07/28   CO2 24 28-Jul-2024   GLUCOSE 205 (H) July 28, 2024   BUN 7 (L) July 28, 2024   CREATININE 0.60 07-28-2024  CALCIUM  8.6 (L) 07/10/2024   GFRNONAA >60 07/10/2024   GFRAA >60 08/03/2017   Lipid Panel:  Lab Results  Component Value Date   LDLCALC UNABLE TO CALCULATE IF TRIGLYCERIDE OVER 400 mg/dL 87/76/7981   YhaJ8r:  Lab Results  Component Value Date   HGBA1C 7.8 (H) 07/31/2017   Urine Drug Screen: No results found for: LABOPIA, COCAINSCRNUR, LABBENZ, AMPHETMU, THCU, LABBARB  Alcohol Level No results found for: [facility] INR  Lab Results  Component Value Date   INR 1.0 07/10/2024   APTT  Lab Results  Component Value Date   APTT 28 07/10/2024   AED levels: No results found for: PHENYTOIN, ZONISAMIDE, LAMOTRIGINE, LEVETIRACETA  CT Head without contrast(Personally reviewed): 1. No acute intracranial abnormality related to the suspected stroke. 2. ASPECTS: 10.  CT angio Head and Neck with contrast(Personally reviewed): 1. No evidence of emergent large vessel occlusion. 2. 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 3. Mild-to-moderate calcific atheromatous disease and stenosis of the petrous and cavernous segments of the left internal carotid artery. 4. Mild irregular stenosis of the P1 segments of the posterior cerebral arteries bilaterally.  MRI Brain(Personally reviewed): Pending    ASSESSMENT   Darlene Pugh is a 63 y.o. female with PMH of stroke, DM, and HTN presenting as a code stroke with right facial droop, dysarthria, and mild right sided weakness. CT with no hemorrhage. CTA pending. MRI brain ordered. Neuro exam notable for slurred speech, R arm weak hand grip and  slowed movements along with BL lower ext weakness with right worse than left. She reports that BL lower ext weakness is going on for more than 6 months.  She is outside the window for tnkase and with no LVO, not a candidate for thrombectomy.  Impression: High suspicion for a L sided stroke, likely suspect small vessel left Basal ganglia stroke.   RECOMMENDATIONS  Plan:  - Frequent Neuro checks per stroke unit protocol - Recommend brain imaging with MRI Brain without contrast - Recommend obtaining TTE - Recommend obtaining Lipid panel with LDL - Please start statin if LDL > 70 - Recommend HbA1c to evaluate for diabetes and how well it is controlled. - Antithrombotic - Aspirin  81mg  daily along with plavix  75mg  daily x 21days, followed by Aspirin  81mg  daily alone. - Recommend DVT ppx - SBP goal - permissive hypertension first 24 h < 220/110. Held home meds.  - Recommend Telemetry monitoring for arrythmia - Recommend bedside swallow screen prior to PO intake. - Stroke education booklet - Recommend PT/OT/SLP consult - smokes Cigarillos, 7-8 a day. Counseled her on the importance of quitting smoking to reduce risk of strokes in the future.  For chronic BL lower ext weakness: B12, folate, B6, Thiamine, TSH, Copper with ceruloplasmin and Zinc. Recommend outpatient EMG/NCS.  ______________________________________________________________________    Bonney Jorene Last, NP [facility]   NEUROHOSPITALIST ADDENDUM Performed a face to face diagnostic evaluation.   I have reviewed the contents of history and physical exam as documented by PA/ARNP/Resident and agree with above documentation.  I have discussed and formulated the above plan as documented. Edits to the note have been made as needed.  Plan discussed with patient and with Dr. Dreama with the ED team.  I personally spent a total of 80 minutes in the care of the patient today including preparing to see the  patient, getting/reviewing separately obtained history, performing a medically appropriate exam/evaluation, counseling and educating,  placing orders, referring and communicating with other health care professionals, documenting clinical information in the EHR, independently interpreting results, communicating results, and coordinating care.   Ziyan Hillmer, MD [facility] [phone]   If 7pm to 7am, please call on call as listed on AMION.

## 2024-07-11 ENCOUNTER — Observation Stay (HOSPITAL_COMMUNITY)

## 2024-07-11 ENCOUNTER — Encounter (HOSPITAL_COMMUNITY): Payer: Self-pay | Admitting: Internal Medicine

## 2024-07-11 DIAGNOSIS — I10 Essential (primary) hypertension: Secondary | ICD-10-CM | POA: Diagnosis not present

## 2024-07-11 DIAGNOSIS — F54 Psychological and behavioral factors associated with disorders or diseases classified elsewhere: Secondary | ICD-10-CM | POA: Diagnosis not present

## 2024-07-11 LAB — RESPIRATORY PANEL BY PCR

## 2024-07-11 LAB — CBC
HCT: 45.5 % (ref 36.0–46.0)
Hemoglobin: 15.2 g/dL — ABNORMAL HIGH (ref 12.0–15.0)
MCH: 32.3 pg (ref 26.0–34.0)
MCHC: 33.4 g/dL (ref 30.0–36.0)
MCV: 96.6 fL (ref 80.0–100.0)
Platelets: 292 K/uL (ref 150–400)
RBC: 4.71 MIL/uL (ref 3.87–5.11)
RDW: 12.6 % (ref 11.5–15.5)
WBC: 8.1 K/uL (ref 4.0–10.5)
nRBC: 0 % (ref 0.0–0.2)

## 2024-07-11 LAB — ECHOCARDIOGRAM COMPLETE BUBBLE STUDY
Area-P 1/2: 5.27 cm2
S' Lateral: 2.5 cm

## 2024-07-11 LAB — TROPONIN I (HIGH SENSITIVITY)
Troponin I (High Sensitivity): 8 ng/L (ref <18)
Troponin I (High Sensitivity): 8 ng/L (ref <18)

## 2024-07-11 LAB — LIPID PANEL
Cholesterol: 277 mg/dL — ABNORMAL HIGH (ref 0–200)
HDL: 49 mg/dL (ref >40)
LDL Cholesterol: UNDETERMINED mg/dL (ref 0–99)
Total CHOL/HDL Ratio: 5.7 ratio
Triglycerides: 435 mg/dL — ABNORMAL HIGH (ref <150)
VLDL: UNDETERMINED mg/dL (ref 0–40)

## 2024-07-11 LAB — GLUCOSE, CAPILLARY
Glucose-Capillary: 171 mg/dL — ABNORMAL HIGH (ref 70–99)
Glucose-Capillary: 152 mg/dL — ABNORMAL HIGH (ref 70–99)
Glucose-Capillary: 161 mg/dL — ABNORMAL HIGH (ref 70–99)
Glucose-Capillary: 133 mg/dL — ABNORMAL HIGH (ref 70–99)

## 2024-07-11 LAB — BASIC METABOLIC PANEL WITH GFR
Anion gap: 14 (ref 5–15)
BUN: 11 mg/dL (ref 8–23)
CO2: 24 mmol/L (ref 22–32)
Calcium: 9.5 mg/dL (ref 8.9–10.3)
Chloride: 102 mmol/L (ref 98–111)
Creatinine, Ser: 0.87 mg/dL (ref 0.44–1.00)
GFR, Estimated: >60 mL/min (ref >60)
Glucose, Bld: 183 mg/dL — ABNORMAL HIGH (ref 70–99)
Potassium: 4.1 mmol/L (ref 3.5–5.1)
Sodium: 140 mmol/L (ref 135–145)

## 2024-07-11 LAB — RESP PANEL BY RT-PCR (RSV, FLU A&B, COVID)  RVPGX2
Influenza A by PCR: NEGATIVE
Influenza B by PCR: NEGATIVE
Resp Syncytial Virus by PCR: NEGATIVE
SARS Coronavirus 2 by RT PCR: NEGATIVE

## 2024-07-11 LAB — MAGNESIUM: Magnesium: 1.9 mg/dL (ref 1.7–2.4)

## 2024-07-11 MED ORDER — ASPIRIN 81 MG PO TBEC
81.0000 mg | DELAYED_RELEASE_TABLET | Freq: Every day | ORAL | Status: DC
  Administered 2024-07-12 – 2024-07-16 (×5): 81 mg via ORAL
  Filled 2024-07-11 (×6): qty 1

## 2024-07-11 MED ORDER — ASPIRIN 300 MG RE SUPP
150.0000 mg | Freq: Every day | RECTAL | Status: DC
  Administered 2024-07-11: 150 mg via RECTAL
  Filled 2024-07-11: qty 1

## 2024-07-11 MED ORDER — LACTATED RINGERS IV BOLUS
1000.0000 mL | Freq: Once | INTRAVENOUS | Status: AC
  Administered 2024-07-11: 1000 mL via INTRAVENOUS

## 2024-07-11 MED ORDER — CHLORHEXIDINE GLUCONATE CLOTH 2 % EX PADS
6.0000 | MEDICATED_PAD | Freq: Every day | CUTANEOUS | Status: DC
  Administered 2024-07-11 – 2024-07-12 (×2): 6 via TOPICAL

## 2024-07-11 NOTE — Progress Notes (Cosign Needed)
 HD#1 SUBJECTIVE:  Patient Summary: Darlene Pugh is a 63 y.o. person living with a history of type 2 diabetes, hypertension, osteoarthritis, vitamin D deficiency who presented with right-sided weakness after being found down at home and admitted for CVA on hospital day 1.  Overnight Events: The patient was complaining of worsening vision and weakness. She was examined and it was determined that there was no progression of her neurological symptoms from earlier in the day.   Interim History: Patient was seen and examined at the bedside this morning.  She was feeling better however she is still very distressed that her legs are weak.  Has no acute concerns at this time.  OBJECTIVE:  Vital Signs: Vitals:   07/11/24 1100 07/11/24 1111 07/11/24 1141 07/11/24 1442  BP: 97/72 111/77 109/80 101/75  Pulse:   98 84  Resp: 18  18 18   Temp:   98.3 F (36.8 C) 99 F (37.2 C)  TempSrc:   Oral Oral  SpO2:   97% 97%  Weight:      Height:       Supplemental O2: Room Air SpO2: 97 % O2 Flow Rate (L/min): 2 L/min  Filed Weights   07/10/24 1000 07/10/24 1047  Weight: 76.2 kg 76.2 kg     Intake/Output Summary (Last 24 hours) at 07/11/2024 1655 Last data filed at 07/11/2024 0451 Gross per 24 hour  Intake 240 ml  Output 650 ml  Net -410 ml   Net IO Since Admission: -410 mL [07/11/24 1655]  Physical Exam: Const: Awake, alert in NAD HENT: Normocephalic, atraumatic, mucus membranes moist Card: RRR, No MRG, No pitting edema on LE's bilaterally  Resp: Crackles noted bilaterally.  No increased work of breathing. Abd: Soft, NTND, Bsx4 Extremities: Warm, pink Neuro: Right facial droop noted.  Weakness noted in right upper and right lower extremity.  Patient Lines/Drains/Airways Status     Active Line/Drains/Airways     Name Placement date Placement time Site Days   Peripheral IV 07/10/24 18 G Left Antecubital 07/10/24  1046  Antecubital  1   Peripheral IV 07/11/24 22 G 1  Anterior;Left Forearm 07/11/24  1621  Forearm  less than 1   Urethral Catheter Manuelita Poet, RN Non-latex 16 Fr. 07/11/24  0441  Non-latex  less than 1            Pertinent labs and imaging:      Latest Ref Rng & Units 07/11/2024    2:37 AM 07/10/2024   10:21 AM 07/10/2024   10:13 AM  CBC  WBC 4.0 - 10.5 K/uL 8.1   7.0   Hemoglobin 12.0 - 15.0 g/dL 84.7  84.3  85.4   Hematocrit 36.0 - 46.0 % 45.5  46.0  44.1   Platelets 150 - 400 K/uL 292   255        Latest Ref Rng & Units 07/11/2024    2:37 AM 07/10/2024   10:21 AM 07/10/2024   10:13 AM  CMP  Glucose 70 - 99 mg/dL 816  794  781   BUN 8 - 23 mg/dL 11  7  9    Creatinine 0.44 - 1.00 mg/dL 9.12  9.39  9.40   Sodium 135 - 145 mmol/L 140  143  142   Potassium 3.5 - 5.1 mmol/L 4.1  3.1  3.4   Chloride 98 - 111 mmol/L 102  106  105   CO2 22 - 32 mmol/L 24   24   Calcium  8.9 - 10.3  mg/dL 9.5   8.6   Total Protein 6.5 - 8.1 g/dL   6.2   Total Bilirubin 0.0 - 1.2 mg/dL   1.9   Alkaline Phos 38 - 126 U/L   77   AST 15 - 41 U/L   16   ALT 0 - 44 U/L   13     ECHOCARDIOGRAM COMPLETE BUBBLE STUDY Result Date: 07/11/2024    ECHOCARDIOGRAM REPORT   Patient Name:   Darlene Pugh [facility] Date of Exam: 07/11/2024 Medical Rec #:  [id number]                Height:       64.0 in Accession #:    [id number]               Weight:       168.0 lb Date of Birth:  [birth year]                 BSA:          1.817 m Patient Age:    63 years                 BP:           112/81 mmHg Patient Gender: F                        HR:           100 bpm. Exam Location:  Inpatient Procedure: 2D Echo, Cardiac Doppler, Color Doppler and Saline Contrast Bubble            Study (Both Spectral and Color Flow Doppler were utilized during            procedure). Indications:    Stroke  History:        Patient has no prior history of Echocardiogram examinations.                 Risk Factors:Hypertension, Diabetes, Dyslipidemia and Current                 Smoker.  Sonographer:     Philomena Daring Referring Phys: [id number] ERIN SCHLOSSMAN IMPRESSIONS  1. Left ventricular ejection fraction, by estimation, is 55 to 60%. The left ventricle has normal function. The left ventricle has no regional wall motion abnormalities. There is mild concentric left ventricular hypertrophy. Left ventricular diastolic parameters are indeterminate.  2. Right ventricular systolic function is normal. The right ventricular size is normal.  3. The mitral valve is normal in structure. No evidence of mitral valve regurgitation. No evidence of mitral stenosis.  4. The aortic valve is normal in structure. Aortic valve regurgitation is not visualized. No aortic stenosis is present.  5. The inferior vena cava is normal in size with greater than 50% respiratory variability, suggesting right atrial pressure of 3 mmHg. FINDINGS  Left Ventricle: Left ventricular ejection fraction, by estimation, is 55 to 60%. The left ventricle has normal function. The left ventricle has no regional wall motion abnormalities. The left ventricular internal cavity size was normal in size. There is  mild concentric left ventricular hypertrophy. Left ventricular diastolic parameters are indeterminate. Right Ventricle: The right ventricular size is normal. No increase in right ventricular wall thickness. Right ventricular systolic function is normal. Left Atrium: Left atrial size was normal in size. Right Atrium: Right atrial size was normal in size. Pericardium: There is no evidence of pericardial effusion. Mitral Valve: The mitral valve is normal  in structure. No evidence of mitral valve regurgitation. No evidence of mitral valve stenosis. Tricuspid Valve: The tricuspid valve is normal in structure. Tricuspid valve regurgitation is not demonstrated. No evidence of tricuspid stenosis. Aortic Valve: The aortic valve is normal in structure. Aortic valve regurgitation is not visualized. No aortic stenosis is present. Pulmonic Valve: The pulmonic valve  was normal in structure. Pulmonic valve regurgitation is not visualized. No evidence of pulmonic stenosis. Aorta: The aortic root is normal in size and structure. Venous: The inferior vena cava is normal in size with greater than 50% respiratory variability, suggesting right atrial pressure of 3 mmHg. IAS/Shunts: No atrial level shunt detected by color flow Doppler. Agitated saline contrast was given intravenously to evaluate for intracardiac shunting.  LEFT VENTRICLE PLAX 2D LVIDd:         3.60 cm   Diastology LVIDs:         2.50 cm   LV e' medial:    5.77 cm/s LV PW:         1.10 cm   LV E/e' medial:  9.5 LV IVS:        1.20 cm   LV e' lateral:   5.11 cm/s LVOT diam:     2.14 cm   LV E/e' lateral: 10.8 LV SV:         57 LV SV Index:   31 LVOT Area:     3.60 cm  RIGHT VENTRICLE             IVC RV S prime:     12.90 cm/s  IVC diam: 1.62 cm TAPSE (M-mode): 1.8 cm LEFT ATRIUM             Index        RIGHT ATRIUM           Index LA diam:        2.69 cm 1.48 cm/m   RA Area:     10.40 cm LA Vol (A2C):   24.6 ml 13.54 ml/m  RA Volume:   16.10 ml  8.86 ml/m LA Vol (A4C):   22.5 ml 12.39 ml/m LA Biplane Vol: 25.5 ml 14.04 ml/m  AORTIC VALVE LVOT Vmax:   105.00 cm/s LVOT Vmean:  68.700 cm/s LVOT VTI:    0.158 m  AORTA Ao Root diam: 2.58 cm Ao Asc diam:  3.00 cm MITRAL VALVE MV Area (PHT): 5.27 cm    SHUNTS MV Decel Time: 144 msec    Systemic VTI:  0.16 m MV E velocity: 55.10 cm/s  Systemic Diam: 2.14 cm MV A velocity: 92.00 cm/s MV E/A ratio:  0.60 Kardie Tobb DO Electronically signed by Dub Huntsman DO Signature Date/Time: 07/11/2024/9:46:08 AM    Final     ASSESSMENT/PLAN:  Assessment: Principal Problem:   Stroke (HCC) Active Problems:   Hypokalemia   T2DM (type 2 diabetes mellitus) (HCC)   HTN (hypertension)   Tobacco use disorder   Alcohol use   Plan: #Acute left internal capsule/basal ganglia CVA -The patient was admitted on 12/1 with subsequent MRI findings of a left intracranial capsule/basal  ganglia CVA. -Today on exam she does continue to have right-sided deficits including upper and lower extremity weakness and right facial droop. -Neurology consult appreciate their recommendations -TTE without atrial shunt -Continue aspirin  and Plavix .  Plavix  for 3 weeks followed by aspirin  alone  -PT/OT to evaluate today  # Hypoxia #Hypotension -This morning the patient's blood pressure was soft.  As she was severely hypertensive upon  arrival and has a history of hypertension, this is peculiar. -With associated tachycardia and hypoxia overnight, concern for PE -Echo without signs of right heart strain -Hypotension potentially induced from n.p.o. status and patient being fluid down. -Will bolus patient with 1 L LR and monitor for improvement of hemodynamic stability.  If the patient remains hypotensive and tachycardic, will obtain CTA of the chest.  #Dysphagia #Concern for aspiration -She was evaluated by speech today who did do a modified barium swallow and subsequently placed patient on dysphagia 2 with nectar thick liquid. -On exam, crackles notable bilaterally. -Significant secretion burden that the patient does continue to cough up, however we will hold off on any medications that may reduce secretions as these can cause cognitive impairment. -RVP negative -Chest x-ray without acute findings - With acute hypoxia overnight, concern for aspiration pneumonitis versus PE due to associated hypotension and tachycardia.  Will monitor very closely.  #Type 2 diabetes Last A1c 9.2 Continue sliding scale insulin   # Nicotine  dependence The patient has significant History of cigarello use. Continue nicotine  patch  #Alcohol use disorder -The patient was to drink several glasses of liquor a day with additional beer occasionally. -She does not have a history of withdrawal however will continue CIWA checks to monitor for signs and symptoms of withdrawal. -Last alcoholic beverage was  11/30  Best Practice: Diet: Dysphagia 2 with nectar thick liquids IVF: Fluids: LR, Rate: 1000 cc bolus VTE: enoxaparin (LOVENOX) injection 40 mg Start: 07/10/24 1445 Code: Full  Disposition planning: Therapy Recs: Pending,  DISPO: Pending clinical improvement  Signature:  Schuyler Novak, DO [facility]  4:55 PM, 07/11/2024  On Call pager [phone]

## 2024-07-11 NOTE — Evaluation (Signed)
 Physical Therapy Evaluation Patient Details Name: Darlene Pugh MRN: [id number] DOB: 08/09/1961 Today's Date: 07/11/2024  History of Present Illness  Pt is a 62 y.o. female presenting 12/01 after being found down at home. MRI brain with acute infarct involving the left internal capsule/basal ganglia. 2. Chronic small vessel ischemic disease with multiple chronic lacunar infarcts. PMH: DM, HTN, vitamin D deficiency, osteoarthritis.  Clinical Impression  Pt presents with admitting diagnosis above. Co-treat with OT. Pt able to step pivot to chair with +2 Mod/Max A. Pt noted with R knee buckling on 3rd step towards chair. PTA pt reports using a standard walker for mobility with a limp. Patient will benefit from intensive inpatient follow-up therapy, >3 hours/day. PT will continue to follow.       If plan is discharge home, recommend the following: Two people to help with walking and/or transfers;A lot of help with bathing/dressing/bathroom;Assistance with cooking/housework;Assistance with feeding;Direct supervision/assist for medications management;Direct supervision/assist for financial management;Assist for transportation;Help with stairs or ramp for entrance;Supervision due to cognitive status   Can travel by private vehicle        Equipment Recommendations Wheelchair (measurements PT);Wheelchair cushion (measurements PT);Hospital bed;Hoyer lift  Recommendations for Other Services  Rehab consult    Functional Status Assessment Patient has had a recent decline in their functional status and demonstrates the ability to make significant improvements in function in a reasonable and predictable amount of time.     Precautions / Restrictions Precautions Precautions: Fall Recall of Precautions/Restrictions: Impaired Restrictions Weight Bearing Restrictions Per Provider Order: No      Mobility  Bed Mobility Overal bed mobility: Needs Assistance Bed Mobility: Supine to Sit      Supine to sit: Mod assist     General bed mobility comments: Mod A to scoot hips to EOB via bedpads.    Transfers Overall transfer level: Needs assistance Equipment used: 2 person hand held assist Transfers: Sit to/from Stand, Bed to chair/wheelchair/BSC Sit to Stand: +2 physical assistance, +2 safety/equipment, Mod assist   Step pivot transfers: +2 physical assistance, Mod assist, Max assist       General transfer comment: Pt able to step pivot to chair with +2 Mod/Max A. Pt noted with R knee buckling on 3rd step towards chair.    Ambulation/Gait               General Gait Details: Deferred for safety  Stairs            Wheelchair Mobility     Tilt Bed    Modified Rankin (Stroke Patients Only) Modified Rankin (Stroke Patients Only) Pre-Morbid Rankin Score: No symptoms Modified Rankin: Moderate disability     Balance Overall balance assessment: Needs assistance Sitting-balance support: Bilateral upper extremity supported, Feet supported Sitting balance-Leahy Scale: Fair     Standing balance support: Bilateral upper extremity supported, During functional activity, Reliant on assistive device for balance Standing balance-Leahy Scale: Poor Standing balance comment: Reliant on therapists                             Pertinent Vitals/Pain Pain Assessment Pain Assessment: No/denies pain    Home Living Family/patient expects to be discharged to:: Private residence Living Arrangements: Alone Available Help at Discharge: Family;Available PRN/intermittently (dtr) Type of Home: Apartment Home Access: Stairs to enter Entrance Stairs-Rails: Right;Left;Can reach both Entrance Stairs-Number of Steps: 4   Home Layout: One level Home Equipment: Shower seat;Standard Walker;Cane - single point  Prior Function Prior Level of Function : Independent/Modified Independent;Driving (2 recent falls)             Mobility Comments: standard  walker vs no DME; pt reports a limp ADLs Comments: indep in ADL, IADL, driving, grocery shopping     Extremity/Trunk Assessment   Upper Extremity Assessment Upper Extremity Assessment: Defer to OT evaluation    Lower Extremity Assessment Lower Extremity Assessment: Generalized weakness    Cervical / Trunk Assessment Cervical / Trunk Assessment: Kyphotic  Communication   Communication Communication: Impaired Factors Affecting Communication: Difficulty expressing self;Reduced clarity of speech    Cognition Arousal: Alert Behavior During Therapy: Flat affect, WFL for tasks assessed/performed                             Following commands: Intact       Cueing Cueing Techniques: Verbal cues, Tactile cues     General Comments General comments (skin integrity, edema, etc.): VSS    Exercises     Assessment/Plan    PT Assessment Patient needs continued PT services  PT Problem List Decreased strength;Decreased activity tolerance;Decreased range of motion;Decreased balance;Decreased mobility;Decreased coordination;Decreased cognition;Decreased knowledge of use of DME;Decreased safety awareness;Decreased knowledge of precautions;Cardiopulmonary status limiting activity       PT Treatment Interventions DME instruction;Gait training;Stair training;Functional mobility training;Therapeutic activities;Balance training;Therapeutic exercise;Neuromuscular re-education;Patient/family education;Cognitive remediation;Wheelchair mobility training    PT Goals (Current goals can be found in the Care Plan section)  Acute Rehab PT Goals Patient Stated Goal: to get better PT Goal Formulation: With patient Time For Goal Achievement: 07/25/24 Potential to Achieve Goals: Fair    Frequency Min 2X/week     Co-evaluation   Reason for Co-Treatment: To address functional/ADL transfers;For patient/therapist safety PT goals addressed during session: Mobility/safety with  mobility;Proper use of DME OT goals addressed during session: ADL's and self-care       AM-PAC PT 6 Clicks Mobility  Outcome Measure Help needed turning from your back to your side while in a flat bed without using bedrails?: A Lot Help needed moving from lying on your back to sitting on the side of a flat bed without using bedrails?: A Lot Help needed moving to and from a bed to a chair (including a wheelchair)?: A Lot Help needed standing up from a chair using your arms (e.g., wheelchair or bedside chair)?: A Lot Help needed to walk in hospital room?: Total Help needed climbing 3-5 steps with a railing? : Total 6 Click Score: 10    End of Session Equipment Utilized During Treatment: Gait belt Activity Tolerance: Patient tolerated treatment well Patient left: in chair;with call bell/phone within reach;with chair alarm set Nurse Communication: Mobility status;Need for lift equipment PT Visit Diagnosis: Other abnormalities of gait and mobility (R26.89)    Time: 1001-1034 PT Time Calculation (min) (ACUTE ONLY): 33 min   Charges:   PT Evaluation $PT Eval Moderate Complexity: 1 Mod   PT General Charges $$ ACUTE PT VISIT: 1 Visit         Sueellen NOVAK, PT, DPT Acute Rehab Services [phone]   Darlene Pugh 07/11/2024, 4:55 PM

## 2024-07-11 NOTE — Progress Notes (Signed)
  Inpatient Rehab Admissions Coordinator :  Per therapy recommendations, patient was screened for CIR candidacy by Ottie Glazier RN MSN.  At this time patient appears to be a potential candidate for CIR. I will place a rehab consult per protocol for full assessment. Please call me with any questions.  Ottie Glazier RN MSN Admissions Coordinator [phone]

## 2024-07-11 NOTE — Progress Notes (Addendum)
 STROKE TEAM PROGRESS NOTE    SIGNIFICANT HOSPITAL EVENTS  Patient presented to ED 12/1 am s/p acute onset of right facial droop and dysarthria, s/p multiple falls. On aspirin  and Brilinta  at home.  Outside of treatment window for TNK, LVO negative.   MRI shows Acute infarct involving the left internal capsule/basal ganglia.  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 Patient sitting up in chair. Stable neuro exam with right facial droop, ble weakness and slurred speech.   OBJECTIVE  CBC    Component Value Date/Time   WBC 8.1 07/11/2024 0237   RBC 4.71 07/11/2024 0237   HGB 15.2 (H) 07/11/2024 0237   HCT 45.5 07/11/2024 0237   PLT 292 07/11/2024 0237   MCV 96.6 07/11/2024 0237   MCH 32.3 07/11/2024 0237   MCHC 33.4 07/11/2024 0237   RDW 12.6 07/11/2024 0237   LYMPHSABS 2.0 07/10/2024 1013   MONOABS 0.6 07/10/2024 1013   EOSABS 0.0 07/10/2024 1013   BASOSABS 0.0 07/10/2024 1013    BMET    Component Value Date/Time   NA 140 07/11/2024 0237   K 4.1 07/11/2024 0237   CL 102 07/11/2024 0237   CO2 24 07/11/2024 0237   GLUCOSE 183 (H) 07/11/2024 0237   BUN 11 07/11/2024 0237   CREATININE 0.87 07/11/2024 0237   CALCIUM  9.5 07/11/2024 0237   GFRNONAA >60 07/11/2024 0237    IMAGING past 24 hours ECHOCARDIOGRAM COMPLETE BUBBLE STUDY Result Date: 07/11/2024    ECHOCARDIOGRAM REPORT   Patient Name:   Darlene Pugh Date of Exam: 07/11/2024 Medical Rec #:  [id number]                Height:       64.0 in Accession #:    [id number]               Weight:       168.0 lb Date of Birth:  1960-12-14                 BSA:          1.817 m Patient Age:    63 years                 BP:           112/81 mmHg Patient Gender: F                        HR:           100 bpm. Exam Location:  Inpatient Procedure: 2D Echo, Cardiac Doppler,  Color Doppler and Saline Contrast Bubble            Study (Both Spectral and Color Flow Doppler were utilized during            procedure). Indications:    Stroke  History:        Patient has no prior history of Echocardiogram examinations.                 Risk Factors:Hypertension, Diabetes, Dyslipidemia and Current                 Smoker.  Sonographer:    Philomena Daring Referring Phys: [id number] ERIN SCHLOSSMAN IMPRESSIONS  1. Left ventricular ejection fraction, by estimation, is 55 to 60%. The left ventricle has normal function. The left  ventricle has no regional wall motion abnormalities. There is mild concentric left ventricular hypertrophy. Left ventricular diastolic parameters are indeterminate.  2. Right ventricular systolic function is normal. The right ventricular size is normal.  3. The mitral valve is normal in structure. No evidence of mitral valve regurgitation. No evidence of mitral stenosis.  4. The aortic valve is normal in structure. Aortic valve regurgitation is not visualized. No aortic stenosis is present.  5. The inferior vena cava is normal in size with greater than 50% respiratory variability, suggesting right atrial pressure of 3 mmHg. FINDINGS  Left Ventricle: Left ventricular ejection fraction, by estimation, is 55 to 60%. The left ventricle has normal function. The left ventricle has no regional wall motion abnormalities. The left ventricular internal cavity size was normal in size. There is  mild concentric left ventricular hypertrophy. Left ventricular diastolic parameters are indeterminate. Right Ventricle: The right ventricular size is normal. No increase in right ventricular wall thickness. Right ventricular systolic function is normal. Left Atrium: Left atrial size was normal in size. Right Atrium: Right atrial size was normal in size. Pericardium: There is no evidence of pericardial effusion. Mitral Valve: The mitral valve is normal in structure. No evidence of mitral valve  regurgitation. No evidence of mitral valve stenosis. Tricuspid Valve: The tricuspid valve is normal in structure. Tricuspid valve regurgitation is not demonstrated. No evidence of tricuspid stenosis. Aortic Valve: The aortic valve is normal in structure. Aortic valve regurgitation is not visualized. No aortic stenosis is present. Pulmonic Valve: The pulmonic valve was normal in structure. Pulmonic valve regurgitation is not visualized. No evidence of pulmonic stenosis. Aorta: The aortic root is normal in size and structure. Venous: The inferior vena cava is normal in size with greater than 50% respiratory variability, suggesting right atrial pressure of 3 mmHg. IAS/Shunts: No atrial level shunt detected by color flow Doppler. Agitated saline contrast was given intravenously to evaluate for intracardiac shunting.  LEFT VENTRICLE PLAX 2D LVIDd:         3.60 cm   Diastology LVIDs:         2.50 cm   LV e' medial:    5.77 cm/s LV PW:         1.10 cm   LV E/e' medial:  9.5 LV IVS:        1.20 cm   LV e' lateral:   5.11 cm/s LVOT diam:     2.14 cm   LV E/e' lateral: 10.8 LV SV:         57 LV SV Index:   31 LVOT Area:     3.60 cm  RIGHT VENTRICLE             IVC RV S prime:     12.90 cm/s  IVC diam: 1.62 cm TAPSE (M-mode): 1.8 cm LEFT ATRIUM             Index        RIGHT ATRIUM           Index LA diam:        2.69 cm 1.48 cm/m   RA Area:     10.40 cm LA Vol (A2C):   24.6 ml 13.54 ml/m  RA Volume:   16.10 ml  8.86 ml/m LA Vol (A4C):   22.5 ml 12.39 ml/m LA Biplane Vol: 25.5 ml 14.04 ml/m  AORTIC VALVE LVOT Vmax:   105.00 cm/s LVOT Vmean:  68.700 cm/s LVOT VTI:    0.158 m  AORTA  Ao Root diam: 2.58 cm Ao Asc diam:  3.00 cm MITRAL VALVE MV Area (PHT): 5.27 cm    SHUNTS MV Decel Time: 144 msec    Systemic VTI:  0.16 m MV E velocity: 55.10 cm/s  Systemic Diam: 2.14 cm MV A velocity: 92.00 cm/s MV E/A ratio:  0.60 Kardie Tobb DO Electronically signed by Dub Huntsman DO Signature Date/Time: 07/11/2024/9:46:08 AM    Final     DG CHEST PORT 1 VIEW Result Date: 07/10/2024 CLINICAL DATA:  10031 Cough 10031 EXAM: PORTABLE CHEST - 1 VIEW COMPARISON:  07/31/2017 FINDINGS: No focal airspace consolidation, pleural effusion, or pneumothorax. Mild cardiomegaly.Aortic atherosclerosis.No acute fracture or destructive lesion. Multilevel thoracic osteophytosis. IMPRESSION: No acute cardiopulmonary abnormality. Electronically Signed   By: Rogelia Myers M.D.   On: 07/10/2024 16:26    Vitals:   07/11/24 0829 07/11/24 1100 07/11/24 1111 07/11/24 1141  BP: 112/81 97/72 111/77 109/80  Pulse: (!) 101   98  Resp:  18  18  Temp: 98.6 F (37 C)   98.3 F (36.8 C)  TempSrc: Oral   Oral  SpO2: 95%   97%  Weight:      Height:         PHYSICAL EXAM General:  Alert patient, appears older than her age CV: Regular rate and rhythm on monitor Respiratory:  Regular, unlabored respirations on room air  NEURO:  Mental Status: AA&Ox3 Speech/Language: No aphasia. Naming, repetition, fluency, and comprehension intact.  Mild dysarthria but can be understood  Cranial Nerves:  II: PERRL. Visual fields full.  III, IV, VI: EOMI. Eyelids elevate symmetrically.  V: Sensation is intact to light touch and symmetrical to face.  VII: Right facial droop VIII: hearing intact to voice. IX, X: Mild dysarthria KP:Dynloizm shrug 5/5. XII: tongue is midline without fasciculations. Motor:  BLE: mild drifts , 4+/5 Tone: is normal and bulk is normal Sensation- Decreased on right side Coordination: FTN slow but intact bilaterally Gait- deferred  Most Recent NIH 6   ASSESSMENT/PLAN  Ms. Darlene Pugh is a 63 y.o. female with  PMH of stroke, DM, and HTN presenting as a code stroke with right facial droop, dysarthria, and mild right sided weakness. CT with no hemorrhage. CTA pending. MRI brain ordered. Neuro exam notable for slurred speech, R arm weak hand grip and slowed movements along with BL lower ext weakness with right worse than  left. She reports that BL lower ext weakness is going on for more than 6 months. She is outside the window for tnkase and with no LVO, not a candidate for thrombectomy. NIH on Admission 6.  Acute Ischemic Infarct:  left internal capsule, basal ganglia  Etiology:  small vessel disease in patient with multiple uncontrolled risk factors  Code Stroke CT head No acute abnormality. ASPECTS 10.    CTA head & neck  No evidence of emergent large vessel occlusion. 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 Mild-to-moderate calcific atheromatous disease and stenosis of the petrous and cavernous segments of the left internal carotid artery. Mild irregular stenosis of the P1 segments of the posterior cerebral arteries bilaterally. MRI   Acute infarct involving the left internal capsule/basal ganglia. Chronic small vessel ischemic disease with multiple chronic lacunar infarcts. 2D Echo 55 to 60%, mild LVH LDL pending HgbA1c 9.5 VTE prophylaxis - lovenox aspirin  81 mg daily prior to admission, now on aspirin  81 mg daily and clopidogrel  75  mg daily for 3 weeks and then plavix  alone. Therapy recommendations:  CIR Disposition:  pending  Hx of Stroke/TIA Multiple chronic lacunar infarcts.seen on this admission's imaging.  No previous stroke workup noted on chart review  Hypertension Home meds:  lisinopril  20mg  daily, toprol -xl 100mg  daily Stable Blood Pressure Goal: BP less than 220/110 Permissive hypertension for 24-48 hours after symptom onset, then gradually normalize.   Hyperlipidemia Home meds:  Lipitor  80mg , Zetia 10mg  daily, resumed in hospital LDL pending, goal < 70 Continue statin at discharge  Diabetes type II Uncontrolled Home meds:  metformin  500mg  daily, farxiga 10mg  daily HgbA1c 9.5, goal < 7.0 CBGs SSI Recommend close follow-up with PCP for better DM control  Tobacco  Abuse Patient is a current smoker       Ready to quit? Yes Nicotine  replacement therapy provided  Dysphagia Patient has post-stroke dysphagia, SLP consulted    Diet   Diet NPO time specified Except for: Ice Chips, Sips with Meds   Advance diet as tolerated  Other Stroke Risk Factors Obesity, Body mass index is 28.84 kg/m., BMI >/= 30 associated with increased stroke risk, recommend weight loss, diet and exercise as appropriate    Hospital day # 0   Pt seen by Neuro NP/APP with MD. Note/plan to be edited by MD as needed.    Rocky JAYSON Likes, DNP, AGACNP-BC [facility] Please use AMION for contact information & EPIC for messaging.  I have personally obtained history,examined this patient, reviewed notes, independently viewed imaging studies, participated in medical decision making and plan of care.ROS completed by me personally and pertinent positives fully documented  I have made any additions or clarifications directly to the above note. Agree with note above.  Patient presented with dysarthria and right facial droop and right-sided weakness and MRI scan shows left basal ganglia lacunar infarct likely from small vessel disease.  Recommend aspirin  and Plavix  for 3 weeks followed by Plavix  alone and aggressive risk factor modification.  Mobilize out of bed.  Therapy consults.  He will likely need inpatient rehab.  Long discussion with patient and son at the bedside and answered questions.   I personally spent a total of 50 minutes in the care of the patient today including getting/reviewing separately obtained history, performing a medically appropriate exam/evaluation, counseling and educating, placing orders, referring and communicating with other health care professionals, documenting clinical information in the EHR, independently interpreting results, and coordinating care.         Eather Popp, MD Medical Director [facility] Pager: [phone] 07/11/2024  2:04 PM

## 2024-07-11 NOTE — Evaluation (Signed)
 Clinical/Bedside Swallow Evaluation Patient Details  Name: Darlene Pugh MRN: [id number] Date of Birth: 11/12/1960  Today's Date: 07/11/2024 Time: SLP Start Time (ACUTE ONLY): 0933 SLP Stop Time (ACUTE ONLY): 0942 SLP Time Calculation (min) (ACUTE ONLY): 9 min  Past Medical History:  Past Medical History:  Diagnosis Date   Diabetes mellitus without complication (HCC)    Hypertension    Past Surgical History:  Past Surgical History:  Procedure Laterality Date   LEFT HEART CATH AND CORONARY ANGIOGRAPHY N/A 08/02/2017   Procedure: LEFT HEART CATH AND CORONARY ANGIOGRAPHY;  Surgeon: Levern Hutching, MD;  Location: [facility] INVASIVE CV [facility];  Service: Cardiovascular;  Laterality: N/A;   HPI:  Darlene Pugh is a 63 year old female admitted after fall. MRI acute infarct involving the left internal capsule/basal ganglia, chronic small vessel ischemic disease with multiple chronic lacunar infarcts. CXR no acute cardiopulmonary abnormality. PMH:   diabetes, hypertension, vitamin D deficiency, osteoarthritis.    Assessment / Plan / Recommendation  Clinical Impression  Pt presents with decreased secretion management with expectoration of mucous, coughing when repositioned and suspected oropharyngeal dysphagia. CN VII and XII impairments with reduced facial retraction and lingual deviation to right. Endentulous and SLP donned dentures. No s/s aspiration with smaller sip water however exhibited immediate hard cough following larger sip. Consumed applesauce without overt difficulty. Recommend NPO except meds crushed in puree, ice chips after oral care. MBS warranted to fully evaluate oropharyngeal swallow and to be completed as soon as able (awaiting reply from xray). SLP Visit Diagnosis: Dysphagia, unspecified (R13.10)    Aspiration Risk  Moderate aspiration risk;Mild aspiration risk    Diet Recommendation           Other Recommendations Oral Care Recommendations: Oral care QID      Swallow Evaluation Recommendations Recommendations: NPO except meds (ice chips) Medication Administration: Crushed with puree   Assistance Recommended at Discharge  TBD  Functional Status Assessment  TBD  Frequency and Duration     TBD       Prognosis        Swallow Study   General Date of Onset: 07/11/24 HPI: Darlene Pugh is a 63 year old female admitted after fall. MRI acute infarct involving the left internal capsule/basal ganglia, chronic small vessel ischemic disease with multiple chronic lacunar infarcts. CXR no acute cardiopulmonary abnormality. PMH:   diabetes, hypertension, vitamin D deficiency, osteoarthritis. Type of Study: Bedside Swallow Evaluation Previous Swallow Assessment:  (none) Diet Prior to this Study: NPO;Other (Comment) (ice chips) Temperature Spikes Noted: No Respiratory Status: Nasal cannula History of Recent Intubation: No Behavior/Cognition: Alert;Cooperative;Pleasant mood Oral Cavity Assessment: Dry Oral Care Completed by SLP: No Oral Cavity - Dentition: Dentures, top;Dentures, bottom Vision: Functional for self-feeding Self-Feeding Abilities: Needs assist Patient Positioning: Upright in bed Baseline Vocal Quality: Low vocal intensity Volitional Cough: Weak Volitional Swallow: Able to elicit    Oral/Motor/Sensory Function Overall Oral Motor/Sensory Function: Mild impairment Facial ROM: Reduced right;Suspected CN VII (facial) dysfunction Facial Symmetry: Abnormal symmetry right;Suspected CN VII (facial) dysfunction Lingual ROM: Reduced left;Suspected CN XII (hypoglossal) dysfunction Lingual Symmetry: Abnormal symmetry right;Suspected CN XII (hypoglossal) dysfunction   Ice Chips Ice chips: Not tested   Thin Liquid Thin Liquid: Impaired Presentation: Cup Oral Phase Impairments:  (none) Pharyngeal  Phase Impairments: Cough - Immediate    Nectar Thick Nectar Thick Liquid: Not tested   Honey Thick Honey Thick Liquid: Not tested    Puree Puree: Within functional limits   Solid  Solid: Not tested      Darlene Pugh 07/11/2024,10:11 AM

## 2024-07-11 NOTE — Evaluation (Signed)
 Modified Barium Swallow Study  Patient Details  Name: Darlene Pugh MRN: [id number] Date of Birth: 1960-11-08  Today's Date: 07/11/2024  Modified Barium Swallow completed.  Full report located under Chart Review in the Imaging Section.  History of Present Illness Darlene Pugh is a 63 year old female admitted after fall. MRI acute infarct involving the left internal capsule/basal ganglia, chronic small vessel ischemic disease with multiple chronic lacunar infarcts. CXR no acute cardiopulmonary abnormality. PMH:   diabetes, hypertension, vitamin D deficiency, osteoarthritis.   Clinical Impression Pt demonstrated a primary oral dysphagia marked by reduced lingual control and cohesion resulting in frequent oral residue spilling to pyriforms with most consistencies and aspirating with thin x 2 (sensed) during subsequent spontaneous swallow.  Chin tuck posture resulted in trace silent aspiration. Her laryngeal strength and mobility are adequate with several instances of decreased sensation of mild residue with thick consistencies. Verbal cues for subsequent swallow clears residue. Mastication with cracker was mildly slow. Recommend Dys 2 texture, nectar thick liquids, crush meds, swallow twice, right lingual sweep and masticate on left side oral cavity Factors that may increase risk of adverse event in presence of aspiration Noe & Lianne 2021):    Swallow Evaluation Recommendations Recommendations: PO diet PO Diet Recommendation: Dysphagia 2 (Finely chopped);Mildly thick liquids (Level 2, nectar thick) Liquid Administration via: Cup;Straw Medication Administration: Crushed with puree Supervision: Staff to assist with self-feeding Swallowing strategies  : Slow rate;Small bites/sips;Check for pocketing or oral holding;Multiple dry swallows after each bite/sip Postural changes: Position pt fully upright for meals Oral care recommendations: Oral care BID  (2x/day)      Dustin Olam Bull 07/11/2024,6:10 PM

## 2024-07-11 NOTE — ED Provider Notes (Signed)
 [facility] Provider Note   CSN: [id number] Arrival date & time: 07/10/24  1012  An emergency department physician performed an initial assessment on this suspected stroke patient at 1013.  Patient presents with: Code Stroke   Darlene Pugh is a 63 y.o. female.   HPI      63yo female with history of DM, htn, previous CVA with left sided facial numbness who presents as a CODE STROKE.   She reports 2 falls yesterday with head trauma. Feels like both of her legs are weak. New slurred speech, right facial droop.  Timing unclear--11AM yesterday spoke to sister and was normal, patient reports falls around 6PM>    Denies any medicatino changes, etoh or drug use, denies recent illness, no cough/fever/urinary symptoms.    Past Medical History:  Diagnosis Date   Diabetes mellitus without complication (HCC)    Hypertension      Prior to Admission medications   Medication Sig Start Date End Date Taking? Authorizing Provider  aspirin  EC 81 MG EC tablet Take 1 tablet (81 mg total) by mouth daily. 08/04/17   Levern Hutching, MD  atorvastatin  (LIPITOR ) 80 MG tablet Take 1 tablet (80 mg total) by mouth daily at 6 PM. 08/03/17   Levern Hutching, MD  FARXIGA 10 MG TABS tablet Take 10 mg by mouth daily.    [provider]  gabapentin (NEURONTIN) 100 MG capsule Take 100 mg by mouth at bedtime.     [provider]  lidocaine  (LIDODERM ) 5 % 1 patch daily.    [provider]  lisinopril  (PRINIVIL ,ZESTRIL ) 20 MG tablet Take 1 tablet (20 mg total) by mouth daily. 10/18/15   Cartner, Morene, PA-C  meloxicam  (MOBIC ) 7.5 MG tablet Take 7.5 mg by mouth. 05/21/23   [provider]  metFORMIN  (GLUCOPHAGE ) 500 MG tablet Take 1 tablet (500 mg total) by mouth daily with breakfast. 08/04/17   Levern Hutching, MD  metoprolol  succinate (TOPROL -XL) 100 MG 24 hr tablet Take 100 mg by mouth. 10/21/23 02/22/25  [provider]  nitroGLYCERIN   (NITROSTAT ) 0.4 MG SL tablet Place 1 tablet (0.4 mg total) under the tongue every 5 (five) minutes x 3 doses as needed for chest pain. 08/03/17   Levern Hutching, MD  pantoprazole  (PROTONIX ) 40 MG tablet Take 1 tablet (40 mg total) by mouth daily. 08/03/17   Levern Hutching, MD  QUEtiapine (SEROQUEL) 25 MG tablet Take 25 mg by mouth at bedtime.    [provider]  ticagrelor  (BRILINTA ) 90 MG TABS tablet Take 1 tablet (90 mg total) by mouth 2 (two) times daily. 08/03/17   Levern Hutching, MD  VENTOLIN HFA 108 (90 Base) MCG/ACT inhaler Inhale 2 puffs into the lungs every 6 (six) hours as needed.    [provider]  Vitamin D, Ergocalciferol, (DRISDOL) 50000 units CAPS capsule Take 50,000 Units by mouth every 7 (seven) days.    [provider]    Allergies: Patient has no known allergies.    Review of Systems  Updated Vital Signs BP 112/81 (BP Location: Right Arm)   Pulse (!) 101   Temp 98.6 F (37 C) (Oral)   Resp 18   Ht 5' 4 (1.626 m)   Wt 76.2 kg   SpO2 95%   BMI 28.84 kg/m   Physical Exam Vitals and nursing note reviewed.  Constitutional:      General: She is not in acute distress.    Appearance: She is well-developed. She is not diaphoretic.  HENT:     Head: Normocephalic and atraumatic.  Eyes:     Conjunctiva/sclera: Conjunctivae normal.  Cardiovascular:     Rate and Rhythm: Normal rate and regular rhythm.     Heart sounds: Normal heart sounds. No murmur heard.    No friction rub. No gallop.  Pulmonary:     Effort: Pulmonary effort is normal. No respiratory distress.     Breath sounds: Normal breath sounds. No wheezing or rales.  Abdominal:     General: There is no distension.     Palpations: Abdomen is soft.     Tenderness: There is no abdominal tenderness. There is no guarding.  Musculoskeletal:        General: No tenderness.     Cervical back: Normal range of motion.  Skin:    General: Skin is warm and dry.     Findings: No erythema or  rash.  Neurological:     Mental Status: She is alert and oriented to person, place, and time.     Cranial Nerves: Cranial nerve deficit (right facial droop, tongue deviation, dysarthria) present.     Sensory: No sensory deficit (denies).     Motor: Weakness (slight weakness bilaterally LE, mildly worse on right, right arm slight weakness) present.     Coordination: Coordination normal.     (all labs ordered are listed, but only abnormal results are displayed) Labs Reviewed  COMPREHENSIVE METABOLIC PANEL WITH GFR - Abnormal; Notable for the following components:      Result Value   Potassium 3.4 (*)    Glucose, Bld 218 (*)    Calcium  8.6 (*)    Total Protein 6.2 (*)    Total Bilirubin 1.9 (*)    All other components within normal limits  HEMOGLOBIN A1C - Abnormal; Notable for the following components:   Hgb A1c MFr Bld 9.5 (*)    All other components within normal limits  LIPID PANEL - Abnormal; Notable for the following components:   Cholesterol 277 (*)    Triglycerides 435 (*)    All other components within normal limits  BASIC METABOLIC PANEL WITH GFR - Abnormal; Notable for the following components:   Glucose, Bld 183 (*)    All other components within normal limits  CBC - Abnormal; Notable for the following components:   Hemoglobin 15.2 (*)    All other components within normal limits  GLUCOSE, CAPILLARY - Abnormal; Notable for the following components:   Glucose-Capillary 161 (*)    All other components within normal limits  GLUCOSE, CAPILLARY - Abnormal; Notable for the following components:   Glucose-Capillary 171 (*)    All other components within normal limits  I-STAT CHEM 8, ED - Abnormal; Notable for the following components:   Potassium 3.1 (*)    BUN 7 (*)    Glucose, Bld 205 (*)    Calcium , Ion 0.99 (*)    Hemoglobin 15.6 (*)    All other components within normal limits  CBG MONITORING, ED - Abnormal; Notable for the following components:    Glucose-Capillary 250 (*)    All other components within normal limits  CBG MONITORING, ED - Abnormal; Notable for the following components:   Glucose-Capillary 202 (*)    All other components within normal limits  RESPIRATORY PANEL BY PCR  RESP PANEL BY RT-PCR (RSV, FLU A&B, COVID)  RVPGX2  PROTIME-INR  APTT  CBC  DIFFERENTIAL  ETHANOL  VITAMIN B12  FOLATE  TSH  HIV ANTIBODY (ROUTINE TESTING W  REFLEX)  CK  MAGNESIUM  VITAMIN B1  COPPER, SERUM  CERULOPLASMIN  ZINC  LDL CHOLESTEROL, DIRECT    EKG: EKG Interpretation Date/Time:  Monday July 10 2024 12:49:05 EST Ventricular Rate:  86 PR Interval:  157 QRS Duration:  77 QT Interval:  391 QTC Calculation: 468 R Axis:   9  Text Interpretation: Sinus rhythm Borderline T wave abnormalities No significant change since last tracing Confirmed by Dreama Longs ([id number]) on 07/11/2024 9:31:19 AM  Radiology: [facility] CHEST PORT 1 VIEW Result Date: 07/10/2024 CLINICAL DATA:  10031 Cough 10031 EXAM: PORTABLE CHEST - 1 VIEW COMPARISON:  07/31/2017 FINDINGS: No focal airspace consolidation, pleural effusion, or pneumothorax. Mild cardiomegaly.Aortic atherosclerosis.No acute fracture or destructive lesion. Multilevel thoracic osteophytosis. IMPRESSION: No acute cardiopulmonary abnormality. Electronically Signed   By: Rogelia Myers M.D.   On: 07/10/2024 16:26   MR BRAIN WO CONTRAST Result Date: 07/10/2024 EXAM: MRI BRAIN WITHOUT CONTRAST 07/10/2024 12:34:22 PM TECHNIQUE: Multiplanar multisequence MRI of the head/brain was performed without the administration of intravenous contrast. COMPARISON: Head CT and CTA 07/10/2024. CLINICAL HISTORY: Neuro deficit, acute, stroke suspected. Right sided weakness and facial droop. FINDINGS: BRAIN AND VENTRICLES: There is an acute infarct involving the posterior limb of the left internal capsule and left lentiform nucleus. Chronic lacunar infarcts are present in the basal ganglia bilaterally and in the left  thalamus. No intracranial hemorrhage, mass, midline shift, hydrocephalus, or extra axial fluid collection is identified. T2 hyperintensities in the cerebral white matter bilaterally are nonspecific but compatible with mild chronic small vessel ischemic disease. Cerebral volume is within normal limits for age. Major intracranial vascular flow voids are preserved. ORBITS: No acute abnormality. SINUSES AND MASTOIDS: Small mucous retention cysts in the maxillary sinuses. Clear mastoid air cells. BONES AND SOFT TISSUES: Normal marrow signal. No acute soft tissue abnormality. IMPRESSION: 1. Acute infarct involving the left internal capsule/basal ganglia. 2. Chronic small vessel ischemic disease with multiple chronic lacunar infarcts. Electronically signed by: Dasie Hamburg MD 07/10/2024 12:57 PM EST RP Workstation: [id number]   CT HEAD CODE STROKE WO CONTRAST Addendum Date: 07/10/2024 * ADDENDUM #1 *ADDENDUM: The findings were discussed with Dr. Vanessa at 11:15 am 07/10/24. I was unable to obtain his contact information at the time of the report. ---------------------------------------------------- Electronically signed by: Evalene Coho MD 07/10/2024 11:20 AM EST RP Workstation: [id number]   Result Date: 07/10/2024 * ORIGINAL REPORT * EXAM: CT HEAD WITHOUT CONTRAST 07/10/2024 10:22:12 AM TECHNIQUE: CT of the head was performed without the administration of intravenous contrast. Automated exposure control, iterative reconstruction, and/or weight based adjustment of the mA/kV was utilized to reduce the radiation dose to as low as reasonably achievable. COMPARISON: CT of the head dated 11/19/2008. CLINICAL HISTORY: Neuro deficit, acute, stroke suspected. FINDINGS: BRAIN AND VENTRICLES: No acute hemorrhage. No evidence of acute infarct. Remote basal ganglia lacunar infarcts. Partially empty sella. No hydrocephalus. No extra-axial collection. No mass effect or midline shift. Calcific atherosclerosis. [place] Stroke  Program Early CT Score (ASPECTS): Ganglionic (caudate, internal capsule, lentiform nucleus, insula, M1-M3): 7 Supraganglionic (M4-M6): 3 Total: 10 ORBITS: No acute abnormality. SINUSES: Maxillary sinus retention cysts. SOFT TISSUES AND SKULL: No acute soft tissue abnormality. No skull fracture. IMPRESSION: 1. No acute intracranial abnormality related to the suspected stroke. 2. ASPECTS: 10. Electronically signed by: Evalene Coho MD 07/10/2024 10:42 AM EST RP Workstation: [id number]   CT ANGIO HEAD NECK W WO CM (CODE STROKE) Result Date: 07/10/2024 EXAM: CTA HEAD AND NECK WITH AND WITHOUT 07/10/2024 10:30:18 AM TECHNIQUE: CTA  of the head and neck was performed with and without the administration of 75 mL of iohexol (OMNIPAQUE) 350 MG/ML injection. Multiplanar 2D and/or 3D reformatted images are provided for review. Automated exposure control, iterative reconstruction, and/or weight based adjustment of the mA/kV was utilized to reduce the radiation dose to as low as reasonably achievable. Stenosis of the internal carotid arteries measured using NASCET criteria. COMPARISON: None available CLINICAL HISTORY: Neuro deficit, acute, stroke suspected. FINDINGS: CTA NECK: AORTIC ARCH AND ARCH VESSELS: Moderate calcific plaque within the aortic arch. No dissection or arterial injury. No significant stenosis of the brachiocephalic or subclavian arteries. CERVICAL CAROTID ARTERIES: Calcific plaque within the carotid bulbs bilaterally with less than 10% luminal stenosis bilaterally. The cervical segments of both internal carotid arteries are tortuous. No dissection, arterial injury, or hemodynamically significant stenosis by NASCET criteria. CERVICAL VERTEBRAL ARTERIES: No dissection, arterial injury, or significant stenosis. LUNGS AND MEDIASTINUM: Unremarkable. SOFT TISSUES: No acute abnormality. BONES: No acute abnormality. CTA HEAD: ANTERIOR CIRCULATION: Internal Carotid Arteries: Moderate-to-severe stenosis of the  ascending petrous segment of the right internal carotid artery. Moderate stenosis of the supraclinoid segment of the right internal carotid artery. Mild-to-moderate calcific atheromatous disease and stenosis of the petrous and cavernous segments of the left internal carotid artery. Anterior Cerebral Arteries: The right A1 segment is diminutive or absent. No significant stenosis of the left anterior cerebral artery. Middle Cerebral Arteries: The M1 segments are mildly irregular in contour. No significant stenosis of the middle cerebral arteries. Large Vessel Occlusion: There is no evidence of emergent large vessel occlusion. Aneurysm: No aneurysm. POSTERIOR CIRCULATION: Posterior Cerebral Arteries: Mild irregular stenosis of the P1 segments of the posterior cerebral arteries bilaterally. Basilar Artery: No significant stenosis of the basilar artery. Vertebral Arteries: No significant stenosis of the vertebral arteries. Aneurysm: No aneurysm. OTHER: No dural venous sinus thrombosis on this non-dedicated study. IMPRESSION: 1. No evidence of emergent large vessel occlusion. 2. 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 3. Mild-to-moderate calcific atheromatous disease and stenosis of the petrous and cavernous segments of the left internal carotid artery. 4. Mild irregular stenosis of the P1 segments of the posterior cerebral arteries bilaterally. Electronically signed by: Evalene Coho MD 07/10/2024 10:53 AM EST RP Workstation: [id number]     Procedures   Medications Ordered in the ED  enoxaparin (LOVENOX) injection 40 mg (40 mg Subcutaneous Given 07/10/24 1551)  aspirin  EC tablet 81 mg (81 mg Oral Given 07/10/24 1547)  atorvastatin  (LIPITOR ) tablet 80 mg (80 mg Oral Given 07/10/24 2238)  lidocaine  (LIDODERM ) 5 % 1 patch (1 patch Transdermal Not Given 07/10/24 1548)  pantoprazole  (PROTONIX ) EC tablet 40  mg (40 mg Oral Given 07/10/24 1547)  ezetimibe (ZETIA) tablet 10 mg (10 mg Oral Given 07/10/24 1547)  ipratropium-albuterol (DUONEB) 0.5-2.5 (3) MG/3ML nebulizer solution 3 mL (has no administration in time range)  acetaminophen  (TYLENOL ) tablet 500 mg (has no administration in time range)  insulin  aspart (novoLOG ) injection 0-5 Units ( Subcutaneous Not Given 07/10/24 2136)  insulin  aspart (novoLOG ) injection 0-15 Units (3 Units Subcutaneous Given 07/11/24 0655)  thiamine (VITAMIN B1) tablet 100 mg (100 mg Oral Given 07/10/24 1547)    Or  thiamine (VITAMIN B1) injection 100 mg ( Intravenous See Alternative 07/10/24 1547)  folic acid (FOLVITE) tablet 1 mg (1 mg Oral Given 07/10/24 1547)  multivitamin with minerals tablet 1 tablet (1 tablet Oral Given 07/10/24 1547)  clopidogrel  (PLAVIX ) tablet 75 mg (  75 mg Oral Given 07/10/24 1547)  nicotine  (NICODERM CQ  - dosed in mg/24 hours) patch 21 mg (21 mg Transdermal Patch Removed 07/11/24 0916)  Chlorhexidine Gluconate Cloth 2 % PADS 6 each (has no administration in time range)  sodium chloride  flush (NS) 0.9 % injection 3 mL (3 mLs Intravenous Given 07/10/24 1112)  iohexol (OMNIPAQUE) 350 MG/ML injection 75 mL (75 mLs Intravenous Contrast Given 07/10/24 1031)  LORazepam (ATIVAN) injection 0.5 mg (0.5 mg Intravenous Given 07/10/24 1134)  ondansetron  (ZOFRAN ) injection 4 mg (4 mg Intravenous Given 07/10/24 1124)  potassium chloride SA (KLOR-CON M) CR tablet 40 mEq (40 mEq Oral Given 07/10/24 2238)                                     63yo female with history of DM, htn, previous CVA with left sided facial numbness who presents as a CODE STROKE.   DDx includes stroke, electrolyte abnormalities, ICH, medication.  Neurology at bedside on arrival.  Not a candidate for [facility] as she is outside of window.   CTA shows no sign of LVO.  CT head evaluated by me, radiology and neurology without signs of ICH.  EKG completed and evaluated by me without arrhythmia.  Labs  with mild hyperglycemia without signs of diabetic emergency and no other significant abnormalities.  MRI brain shows acute infarct of left internal capsule/basal ganglia.    Admitted for further stroke work up.     Final diagnoses:  Cerebrovascular accident (CVA), unspecified mechanism [facility])    ED Discharge Orders     None          Dreama Longs, MD 07/11/24 [phone]

## 2024-07-11 NOTE — Evaluation (Signed)
 Occupational Therapy Evaluation Patient Details Name: Darlene Pugh MRN: [id number] DOB: September 17, 1960 Today's Date: 07/11/2024   History of Present Illness   Pt is a 63 y.o. female presenting 12/01 after being found down at home. MRI brain with acute infarct involving the left internal capsule/basal ganglia. 2. Chronic small vessel ischemic disease with multiple chronic lacunar infarcts. PMH: DM, HTN, vitamin D deficiency, osteoarthritis.     Clinical Impressions PTA, pt lived alone and was  independent for BADL, IADL, driving. Upon eval, pt presents with decreased R sided strength, proprioception, balance, safety, awareness, problem solving, and with impulsivity. Pt needing up to mod A for UB ADL and max A for LB ADL. Currently requiring mod A +2 for SPT transfers. Due to significant functional decline, recommending intensive multidisciplinary rehabilitation >3 hours/day to optimize safety and independence in ADL.       If plan is discharge home, recommend the following:   Two people to help with walking and/or transfers;Assist for transportation;Help with stairs or ramp for entrance;A lot of help with bathing/dressing/bathroom;Assistance with cooking/housework;Two people to help with bathing/dressing/bathroom     Functional Status Assessment   Patient has had a recent decline in their functional status and demonstrates the ability to make significant improvements in function in a reasonable and predictable amount of time.     Equipment Recommendations   Other (comment) (defer)     Recommendations for Other Services   Rehab consult;Speech consult     Precautions/Restrictions   Precautions Precautions: Fall Restrictions Weight Bearing Restrictions Per Provider Order: No     Mobility Bed Mobility Overal bed mobility: Needs Assistance Bed Mobility: Supine to Sit     Supine to sit: Mod assist     General bed mobility comments: Mod A to scoot hips to EOB  via bedpads and for trunk    Transfers Overall transfer level: Needs assistance Equipment used: 2 person hand held assist Transfers: Sit to/from Stand, Bed to chair/wheelchair/BSC Sit to Stand: +2 physical assistance, +2 safety/equipment, Mod assist     Step pivot transfers: +2 physical assistance, Mod assist     General transfer comment: Pt able to step pivot to chair with +2 Mod/Max A. Pt noted with R knee buckling on 3rd step towards chair.      Balance Overall balance assessment: Needs assistance Sitting-balance support: Bilateral upper extremity supported, Feet supported Sitting balance-Leahy Scale: Poor Sitting balance - Comments: min A progressing to CGA statically   Standing balance support: Bilateral upper extremity supported, During functional activity, Reliant on assistive device for balance Standing balance-Leahy Scale: Poor Standing balance comment: Reliant on therapists                           ADL either performed or assessed with clinical judgement   ADL Overall ADL's : Needs assistance/impaired Eating/Feeding: Minimal assistance;Sitting   Grooming: Minimal assistance;Sitting   Upper Body Bathing: Moderate assistance;Sitting   Lower Body Bathing: Maximal assistance;Sit to/from stand;Sitting/lateral leans   Upper Body Dressing : Moderate assistance;Sitting   Lower Body Dressing: Maximal assistance;Sitting/lateral leans;Sit to/from stand   Toilet Transfer: Moderate assistance;+2 for physical assistance;+2 for safety/equipment;Stand-pivot           Functional mobility during ADLs: Moderate assistance;+2 for safety/equipment;+2 for physical assistance       Vision Patient Visual Report: No change from baseline Additional Comments: pt denies changes able to locate items on tray table     Perception  Praxis         Pertinent Vitals/Pain Pain Assessment Pain Assessment: No/denies pain     Extremity/Trunk Assessment Upper  Extremity Assessment Upper Extremity Assessment: Left hand dominant;RUE deficits/detail RUE Deficits / Details: grossly 2+/5 RUE Sensation: decreased proprioception;decreased light touch RUE Coordination: decreased fine motor;decreased gross motor   Lower Extremity Assessment Lower Extremity Assessment: Defer to PT evaluation   Cervical / Trunk Assessment Cervical / Trunk Assessment: Kyphotic   Communication Communication Communication: Impaired Factors Affecting Communication:  (mildly slurred speech)   Cognition Arousal: Alert Behavior During Therapy: Flat affect, WFL for tasks assessed/performed Cognition: Cognition impaired     Awareness: Online awareness impaired Memory impairment (select all impairments): Short-term memory, Working civil service fast streamer, Conservation officer, historic buildings Attention impairment (select first level of impairment): Sustained attention Executive functioning impairment (select all impairments): Organization, Problem solving OT - Cognition Comments: once EOB, impulsive and perseverating on attempt to get up needing redirection for MMT prior to impulsively attempting OOB, needing cues to identify that mobility without assist is not safe at this time. aware RUE is not working like normal, but decr self implementation of compensatory techniques noted                 Following commands: Impaired Following commands impaired: Follows one step commands with increased time     Cueing  General Comments   Cueing Techniques: Verbal cues;Gestural cues;Tactile cues  VSS   Exercises     Shoulder Instructions      Home Living Family/patient expects to be discharged to:: Private residence Living Arrangements: Alone Available Help at Discharge: Family;Available PRN/intermittently (dtr) Type of Home: Apartment Home Access: Stairs to enter Entrance Stairs-Number of Steps: 4 Entrance Stairs-Rails: Right;Left;Can reach both Home Layout: One level     Bathroom  Shower/Tub: Chief Strategy Officer: Standard     Home Equipment: Automotive Engineer - single point          Prior Functioning/Environment Prior Level of Function : Independent/Modified Independent;Driving (2 recent falls)             Mobility Comments: standard walker vs no DME; pt reports a limp ADLs Comments: indep in ADL, IADL, driving, grocery shopping    OT Problem List: Decreased strength;Decreased activity tolerance;Impaired balance (sitting and/or standing);Decreased cognition;Decreased safety awareness;Decreased knowledge of use of DME or AE;Impaired UE functional use   OT Treatment/Interventions: Self-care/ADL training;Therapeutic exercise;DME and/or AE instruction;Therapeutic activities;Patient/family education;Balance training;Cognitive remediation/compensation      OT Goals(Current goals can be found in the care plan section)   Acute Rehab OT Goals Patient Stated Goal: get better OT Goal Formulation: With patient Time For Goal Achievement: 07/25/24 Potential to Achieve Goals: Good   OT Frequency:  Min 2X/week    Co-evaluation PT/OT/SLP Co-Evaluation/Treatment: Yes Reason for Co-Treatment: To address functional/ADL transfers;For patient/therapist safety PT goals addressed during session: Mobility/safety with mobility;Proper use of DME OT goals addressed during session: ADL's and self-care      AM-PAC OT 6 Clicks Daily Activity     Outcome Measure Help from another person eating meals?: A Little Help from another person taking care of personal grooming?: A Little Help from another person toileting, which includes using toliet, bedpan, or urinal?: Total Help from another person bathing (including washing, rinsing, drying)?: A Lot Help from another person to put on and taking off regular upper body clothing?: A Lot Help from another person to put on and taking off regular lower body clothing?: Total 6 Click Score: 12  End of  Session Equipment Utilized During Treatment: Gait belt Nurse Communication: Mobility status  Activity Tolerance: Patient tolerated treatment well Patient left: with call bell/phone within reach;in chair;with chair alarm set  OT Visit Diagnosis: Unsteadiness on feet (R26.81);Muscle weakness (generalized) (M62.81);Other symptoms and signs involving cognitive function;Hemiplegia and hemiparesis Hemiplegia - Right/Left: Right Hemiplegia - caused by: Cerebral infarction                Time: 1005-1030 OT Time Calculation (min): 25 min Charges:  OT General Charges $OT Visit: 1 Visit OT Evaluation $OT Eval Moderate Complexity: 1 Mod  Elma JONETTA Lebron FREDERICK, OTR/L [facility] Acute Rehabilitation Office: [phone]   Elma JONETTA Lebron 07/11/2024, 5:00 PM

## 2024-07-12 DIAGNOSIS — G8191 Hemiplegia, unspecified affecting right dominant side: Secondary | ICD-10-CM

## 2024-07-12 DIAGNOSIS — I6381 Other cerebral infarction due to occlusion or stenosis of small artery: Secondary | ICD-10-CM

## 2024-07-12 DIAGNOSIS — R059 Cough, unspecified: Secondary | ICD-10-CM | POA: Clinically undetermined

## 2024-07-12 DIAGNOSIS — Z794 Long term (current) use of insulin: Secondary | ICD-10-CM

## 2024-07-12 DIAGNOSIS — Z7982 Long term (current) use of aspirin: Secondary | ICD-10-CM

## 2024-07-12 DIAGNOSIS — Z7902 Long term (current) use of antithrombotics/antiplatelets: Secondary | ICD-10-CM

## 2024-07-12 DIAGNOSIS — F4323 Adjustment disorder with mixed anxiety and depressed mood: Secondary | ICD-10-CM | POA: Diagnosis not present

## 2024-07-12 DIAGNOSIS — F17290 Nicotine dependence, other tobacco product, uncomplicated: Secondary | ICD-10-CM

## 2024-07-12 DIAGNOSIS — E785 Hyperlipidemia, unspecified: Secondary | ICD-10-CM

## 2024-07-12 DIAGNOSIS — I639 Cerebral infarction, unspecified: Secondary | ICD-10-CM | POA: Clinically undetermined

## 2024-07-12 DIAGNOSIS — I69391 Dysphagia following cerebral infarction: Secondary | ICD-10-CM

## 2024-07-12 LAB — COMPREHENSIVE METABOLIC PANEL WITH GFR
ALT: 13 U/L (ref 0–44)
AST: 15 U/L (ref 15–41)
Albumin: 3.4 g/dL — ABNORMAL LOW (ref 3.5–5.0)
Alkaline Phosphatase: 73 U/L (ref 38–126)
Anion gap: 11 (ref 5–15)
BUN: 10 mg/dL (ref 8–23)
CO2: 30 mmol/L (ref 22–32)
Calcium: 9 mg/dL (ref 8.9–10.3)
Chloride: 102 mmol/L (ref 98–111)
Creatinine, Ser: 0.94 mg/dL (ref 0.44–1.00)
GFR, Estimated: >60 mL/min (ref >60)
Glucose, Bld: 135 mg/dL — ABNORMAL HIGH (ref 70–99)
Potassium: 3.7 mmol/L (ref 3.5–5.1)
Sodium: 143 mmol/L (ref 135–145)
Total Bilirubin: 3 mg/dL — ABNORMAL HIGH (ref 0.0–1.2)
Total Protein: 6.1 g/dL — ABNORMAL LOW (ref 6.5–8.1)

## 2024-07-12 LAB — CBC
HCT: 43.9 % (ref 36.0–46.0)
Hemoglobin: 14.5 g/dL (ref 12.0–15.0)
MCH: 32.2 pg (ref 26.0–34.0)
MCHC: 33 g/dL (ref 30.0–36.0)
MCV: 97.3 fL (ref 80.0–100.0)
Platelets: 261 K/uL (ref 150–400)
RBC: 4.51 MIL/uL (ref 3.87–5.11)
RDW: 12.6 % (ref 11.5–15.5)
WBC: 8.3 K/uL (ref 4.0–10.5)
nRBC: 0 % (ref 0.0–0.2)

## 2024-07-12 LAB — ZINC: Zinc: 45 ug/dL (ref 44–115)

## 2024-07-12 LAB — GLUCOSE, CAPILLARY
Glucose-Capillary: 173 mg/dL — ABNORMAL HIGH (ref 70–99)
Glucose-Capillary: 155 mg/dL — ABNORMAL HIGH (ref 70–99)
Glucose-Capillary: 267 mg/dL — ABNORMAL HIGH (ref 70–99)
Glucose-Capillary: 136 mg/dL — ABNORMAL HIGH (ref 70–99)
Glucose-Capillary: 170 mg/dL — ABNORMAL HIGH (ref 70–99)

## 2024-07-12 LAB — MISC LABCORP TEST (SEND OUT): Labcorp test code: 120295

## 2024-07-12 LAB — COPPER, SERUM: Copper: 108 ug/dL (ref 80–158)

## 2024-07-12 LAB — CERULOPLASMIN: Ceruloplasmin: 35.9 mg/dL (ref 19.0–39.0)

## 2024-07-12 MED ORDER — LISINOPRIL 20 MG PO TABS
20.0000 mg | ORAL_TABLET | Freq: Every day | ORAL | Status: DC
  Administered 2024-07-12 – 2024-07-13 (×2): 20 mg via ORAL
  Filled 2024-07-12 (×2): qty 1

## 2024-07-12 MED ORDER — MIDODRINE HCL 5 MG PO TABS
ORAL_TABLET | ORAL | Status: AC
  Filled 2024-07-12: qty 2

## 2024-07-12 MED ORDER — METOPROLOL SUCCINATE ER 100 MG PO TB24
100.0000 mg | ORAL_TABLET | Freq: Every day | ORAL | Status: DC
  Administered 2024-07-12 – 2024-07-13 (×2): 100 mg via ORAL
  Filled 2024-07-12 (×2): qty 1

## 2024-07-12 MED ORDER — SERTRALINE HCL 50 MG PO TABS: 25.0000 mg | ORAL_TABLET | Freq: Every day | ORAL | Status: DC

## 2024-07-12 MED ORDER — FLUOXETINE HCL 10 MG PO CAPS
10.0000 mg | ORAL_CAPSULE | Freq: Every day | ORAL | Status: DC
  Administered 2024-07-12: 10 mg via ORAL
  Filled 2024-07-12: qty 1

## 2024-07-12 MED ORDER — LACTATED RINGERS IV SOLN: INTRAVENOUS | Status: AC

## 2024-07-12 NOTE — Progress Notes (Signed)
 Speech Language Pathology Treatment: Dysphagia  Patient Details Name: Darlene Pugh MRN: [id number] DOB: 02-22-1961 Today's Date: 07/12/2024 Time: 8770-8748 SLP Time Calculation (min) (ACUTE ONLY): 22 min  Assessment / Plan / Recommendation Clinical Impression  Follow up from [facility] yesterday and briefly reviewed findings, reasoning for strategies with daughter and pt. Ms. Mast able to recall 1 of 3 strategies and cued to use sign on wall and at end of session recalled 2 of 3. Daughter has also been helping cue her. No reports of difficulty with breakfast from RN, pt and family. She consumed straw sips nectar thick and simulated Dys 2 (graham cracker pieces in applesauce) with occasional reminders to place food and masticate on left side and moderate cues for 2 swallows. Upgraded trial of graham cracker x 2 with slower mastication, appearing slightly more effortful with one immediate cough. She had one other cough during session that was delayed and in between consistencies. Pt/family in agreement to remain on Dys 2 (chopped/minced) with good potential for upgrade, nectar thick, strategies and may be able to take meds whole in puree versus crushed. ST will follow.    HPI HPI: Darlene Pugh is a 63 year old female admitted after fall. MRI acute infarct involving the left internal capsule/basal ganglia, chronic small vessel ischemic disease with multiple chronic lacunar infarcts. CXR no acute cardiopulmonary abnormality. PMH:   diabetes, hypertension, vitamin D deficiency, osteoarthritis.      SLP Plan  Continue with current plan of care        Swallow Evaluation Recommendations   Recommendations: PO diet PO Diet Recommendation: Dysphagia 2 (Finely chopped);Mildly thick liquids (Level 2, nectar thick) Liquid Administration via: Cup;Straw Medication Administration: Other (Comment) (upgraded to whole with applesauce if able and check for pocketing) Supervision: Staff to  assist with self-feeding Postural changes: Position pt fully upright for meals Oral care recommendations: Oral care BID (2x/day)     Recommendations                   Rehab consult Oral care BID   Frequent or constant Supervision/Assistance Dysphagia, oropharyngeal phase (R13.12)     Continue with current plan of care     Darlene Pugh  07/12/2024, 1:22 PM

## 2024-07-12 NOTE — Progress Notes (Signed)
 STROKE TEAM PROGRESS NOTE    SIGNIFICANT HOSPITAL EVENTS  Patient presented to ED 12/1 am s/p acute onset of right facial droop and dysarthria, s/p multiple falls. On aspirin  and Brilinta  at home.  Outside of treatment window for TNK, LVO negative.   MRI shows Acute infarct involving the left internal capsule/basal ganglia.  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 Patient sitting up in chair. Stable neuro exam with right facial droop, ble weakness and slurred speech.   OBJECTIVE  CBC    Component Value Date/Time   WBC 8.3 07/12/2024 0150   RBC 4.51 07/12/2024 0150   HGB 14.5 07/12/2024 0150   HCT 43.9 07/12/2024 0150   PLT 261 07/12/2024 0150   MCV 97.3 07/12/2024 0150   MCH 32.2 07/12/2024 0150   MCHC 33.0 07/12/2024 0150   RDW 12.6 07/12/2024 0150   LYMPHSABS 2.0 07/10/2024 1013   MONOABS 0.6 07/10/2024 1013   EOSABS 0.0 07/10/2024 1013   BASOSABS 0.0 07/10/2024 1013    BMET    Component Value Date/Time   NA 143 07/12/2024 0150   K 3.7 07/12/2024 0150   CL 102 07/12/2024 0150   CO2 30 07/12/2024 0150   GLUCOSE 135 (H) 07/12/2024 0150   BUN 10 07/12/2024 0150   CREATININE 0.94 07/12/2024 0150   CALCIUM  9.0 07/12/2024 0150   GFRNONAA >60 07/12/2024 0150    IMAGING past 24 hours DG Swallowing Func-Speech Pathology Result Date: 07/11/2024 Table formatting from the original result was not included. Modified Barium Swallow Study Patient Details Name: Alleigh Mollica MRN: [id number] Date of Birth: 11-11-1960 Today's Date: 07/11/2024 HPI/PMH: HPI: Anushka Lawanda Khokhar is a 63 year old female admitted after fall. MRI acute infarct involving the left internal capsule/basal ganglia, chronic small vessel ischemic disease with multiple chronic lacunar infarcts. CXR no acute cardiopulmonary abnormality.  PMH:   diabetes, hypertension, vitamin D deficiency, osteoarthritis. Clinical Impression: Clinical Impression: Pt demonstrated a primary oral dysphagia marked by reduced lingual control and cohesion resulting in frequent oral residue spilling to pyriforms with most consistencies and aspirating with thin x 2 (sensed) during subsequent spontaneous swallow.  Chin tuck posture resulted in trace silent aspiration. Her laryngeal strength and mobility are adequate with several instances of decreased sensation of mild residue with thick consistencies. Verbal cues for subsequent swallow clears residue. Mastication with cracker was mildly slow. Recommend Dys 2 texture, nectar thick liquids, crush meds, swallow twice, right lingual sweep and masticate on left side oral cavity Factors that may increase risk of adverse event in presence of aspiration Noe & Lianne 2021): No data recorded Recommendations/Plan: Swallowing Evaluation Recommendations Swallowing Evaluation Recommendations Recommendations: PO diet PO Diet Recommendation: Dysphagia 2 (Finely chopped); Mildly thick liquids (Level 2, nectar thick) Liquid Administration via: Cup; Straw Medication Administration: Crushed with puree Supervision: Staff to assist with self-feeding Swallowing strategies  : Slow rate; Small bites/sips; Check for pocketing or oral holding; Multiple dry swallows after each bite/sip Postural changes: Position pt fully upright for meals Oral care recommendations: Oral care BID (2x/day) Treatment Plan Treatment Plan Treatment recommendations: Therapy as outlined in treatment plan below Follow-up recommendations: Acute inpatient rehab (3 hours/day) Functional status assessment: Patient has had a recent decline in their functional status and demonstrates the ability to make significant improvements in function in a reasonable and predictable amount of time. Treatment frequency: Min 2x/week  Treatment duration: 2 weeks Interventions: Patient/family  education; Aspiration precaution training; Trials of upgraded texture/liquids; Diet toleration management by SLP Recommendations Recommendations for follow up therapy are one component of a multi-disciplinary discharge planning process, led by the attending physician.  Recommendations may be updated based on patient status, additional functional criteria and insurance authorization. Assessment: Orofacial Exam: Orofacial Exam Oral Cavity: Oral Hygiene: WFL Oral Cavity - Dentition: Dentures, top; Dentures, bottom Orofacial Anatomy: Other (comment) Oral Motor/Sensory Function: Suspected cranial nerve impairment CN V - Trigeminal: Not tested CN VII - Facial: Right motor impairment CN XII - Hypoglossal: Right motor impairment Anatomy: Anatomy: Suspected cervical osteophytes Boluses Administered: Boluses Administered Boluses Administered: Thin liquids (Level 0); Mildly thick liquids (Level 2, nectar thick); Moderately thick liquids (Level 3, honey thick); Puree; Solid  Oral Impairment Domain: Oral Impairment Domain Lip Closure: Interlabial escape, no progression to anterior lip Tongue control during bolus hold: Posterior escape of less than half of bolus Bolus preparation/mastication: Slow prolonged chewing/mashing with complete recollection Bolus transport/lingual motion: Brisk tongue motion Oral residue: Residue collection on oral structures Location of oral residue : Floor of mouth; Tongue Initiation of pharyngeal swallow : Pyriform sinuses  Pharyngeal Impairment Domain: Pharyngeal Impairment Domain Soft palate elevation: No bolus between soft palate (SP)/pharyngeal wall (PW) Laryngeal elevation: Complete superior movement of thyroid cartilage with complete approximation of arytenoids to epiglottic petiole Anterior hyoid excursion: Complete anterior movement Epiglottic movement: Complete inversion Laryngeal vestibule closure: Complete, no air/contrast in laryngeal vestibule Pharyngeal stripping wave : Present -  complete Pharyngeal contraction (A/P view only): N/A Pharyngoesophageal segment opening: Complete distension and complete duration, no obstruction of flow Tongue base retraction: No contrast between tongue base and posterior pharyngeal wall (PPW) Pharyngeal residue: Collection of residue within or on pharyngeal structures Location of pharyngeal residue: Valleculae (from oral cavity)  Esophageal Impairment Domain: Esophageal Impairment Domain Esophageal clearance upright position: Esophageal retention (minimal) Pill: No data recorded Penetration/Aspiration Scale Score: Penetration/Aspiration Scale Score 1.  Material does not enter airway: Puree; Solid; Moderately thick liquids (Level 3, honey thick) 2.  Material enters airway, remains ABOVE vocal cords then ejected out: Mildly thick liquids (Level 2, nectar thick) 7.  Material enters airway, passes BELOW cords and not ejected out despite cough attempt by patient: Thin liquids (Level 0) 8.  Material enters airway, passes BELOW cords without attempt by patient to eject out (silent aspiration) : Thin liquids (Level 0) (with chin tuck) Compensatory Strategies: Compensatory Strategies Compensatory strategies: Yes Straw: Effective (flash x 1 with straw) Chin tuck: Ineffective Ineffective Chin Tuck: Thin liquid (Level 0)   General Information: Caregiver present: No  Diet Prior to this Study: NPO; Other (Comment) (ice chips)   Temperature : Normal   Respiratory Status: WFL   Supplemental O2: Nasal cannula   History of Recent Intubation: No  Behavior/Cognition: Alert; Cooperative; Pleasant mood Self-Feeding Abilities: Able to self-feed Baseline vocal quality/speech: Normal Volitional Cough: Able to elicit Volitional Swallow: Able to elicit Exam Limitations: No limitations Goal Planning: Prognosis for improved oropharyngeal function: Good No data recorded No data recorded No data recorded Consulted and agree with results and recommendations: Patient; Family member/caregiver  Pain: Pain Assessment Pain Assessment: No/denies pain End of Session: Start Time:SLP Start Time (ACUTE ONLY): 1349 Stop Time: SLP Stop Time (ACUTE ONLY): 1406 Time Calculation:SLP Time Calculation (min) (ACUTE ONLY): 17 min Charges: SLP Evaluations $ SLP Speech Visit: 1 Visit SLP Evaluations $BSS Swallow: 1 Procedure $MBS Swallow: 1 Procedure SLP visit diagnosis: SLP Visit Diagnosis: Dysphagia, oropharyngeal phase (  R13.12) Past Medical History: Past Medical History: Diagnosis Date  Diabetes mellitus without complication (HCC)   Hypertension  Past Surgical History: Past Surgical History: Procedure Laterality Date  LEFT HEART CATH AND CORONARY ANGIOGRAPHY N/A 08/02/2017  Procedure: LEFT HEART CATH AND CORONARY ANGIOGRAPHY;  Surgeon: Levern Hutching, MD;  Location: [facility] INVASIVE CV LAB;  Service: Cardiovascular;  Laterality: N/A; Dustin Olam Bull 07/11/2024, 6:11 PM   Vitals:   07/12/24 0007 07/12/24 0418 07/12/24 0819 07/12/24 1142  BP: (!) 133/103 (!) 137/90 (!) 154/88 (!) 153/84  Pulse: 92 92 79 90  Resp: 17 17 18 18   Temp: 98.2 F (36.8 C) 98.7 F (37.1 C) 98 F (36.7 C) 98.6 F (37 C)  TempSrc: Oral Oral  Oral  SpO2: 100% 97% 97% 97%  Weight:      Height:         PHYSICAL EXAM General:  Alert patient, appears older than her age CV: Regular rate and rhythm on monitor Respiratory:  Regular, unlabored respirations on room air  NEURO:  Mental Status: AA&Ox3 Speech/Language: No aphasia. Naming, repetition, fluency, and comprehension intact.  Mild dysarthria but can be understood  Cranial Nerves:  II: PERRL. Visual fields full.  III, IV, VI: EOMI. Eyelids elevate symmetrically.  V: Sensation is intact to light touch and symmetrical to face.  VII: Right facial droop VIII: hearing intact to voice. IX, X: Mild dysarthria KP:Dynloizm shrug 5/5. XII: tongue is midline without fasciculations. Motor:  BLE: mild drifts , 4+/5 Tone: is normal and bulk is normal Sensation- Decreased on  right side Coordination: FTN slow but intact bilaterally Gait- deferred  Most Recent NIH 6   ASSESSMENT/PLAN  Ms. Ilee Randleman is a 62 y.o. female with  PMH of stroke, DM, and HTN presenting as a code stroke with right facial droop, dysarthria, and mild right sided weakness. CT with no hemorrhage. CTA pending. MRI brain ordered. Neuro exam notable for slurred speech, R arm weak hand grip and slowed movements along with BL lower ext weakness with right worse than left. She reports that BL lower ext weakness is going on for more than 6 months. She is outside the window for tnkase and with no LVO, not a candidate for thrombectomy. NIH on Admission 6.  Acute Ischemic Infarct:  left internal capsule, basal ganglia  Etiology:  small vessel disease in patient with multiple uncontrolled risk factors  Code Stroke CT head No acute abnormality. ASPECTS 10.    CTA head & neck  No evidence of emergent large vessel occlusion. 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 Mild-to-moderate calcific atheromatous disease and stenosis of the petrous and cavernous segments of the left internal carotid artery. Mild irregular stenosis of the P1 segments of the posterior cerebral arteries bilaterally. MRI   Acute infarct involving the left internal capsule/basal ganglia. Chronic small vessel ischemic disease with multiple chronic lacunar infarcts. 2D Echo 55 to 60%, mild LVH LDL pending HgbA1c 9.5 VTE prophylaxis - lovenox aspirin  81 mg daily prior to admission, now on aspirin  81 mg daily and clopidogrel  75 mg daily for 3 weeks and then plavix  alone. Therapy recommendations:  CIR Disposition:  pending  Hx of Stroke/TIA Multiple chronic lacunar infarcts.seen on this admission's imaging.  No previous stroke workup noted on chart review  Hypertension Home meds:  lisinopril  20mg  daily, toprol -xl  100mg  daily Stable Blood Pressure Goal: BP less than 220/110 Permissive hypertension for 24-48 hours  after symptom onset, then gradually normalize.   Hyperlipidemia Home meds:  Lipitor  80mg , Zetia 10mg  daily, resumed in hospital LDL pending, goal < 70 Continue statin at discharge  Diabetes type II Uncontrolled Home meds:  metformin  500mg  daily, farxiga 10mg  daily HgbA1c 9.5, goal < 7.0 CBGs SSI Recommend close follow-up with PCP for better DM control  Tobacco Abuse Patient is a current smoker       Ready to quit? Yes Nicotine  replacement therapy provided  Dysphagia Patient has post-stroke dysphagia, SLP consulted    Diet   DIET DYS 2 Room service appropriate? Yes with Assist; Fluid consistency: Nectar Thick   Advance diet as tolerated  Other Stroke Risk Factors Obesity, Body mass index is 28.84 kg/m., BMI >/= 30 associated with increased stroke risk, recommend weight loss, diet and exercise as appropriate    Hospital day # 1   Patient presented with dysarthria and right facial droop and right-sided weakness and MRI scan shows left basal ganglia lacunar infarct likely from small vessel disease.  Recommend aspirin  and Plavix  for 3 weeks followed by Plavix  alone and aggressive risk factor modification.  Mobilize out of bed.  Therapy consults.  He will likely need inpatient rehab.  Follow-up as an outpatient in stroke clinic in 2 months with nurse practitioner. Stroke team will sign off.  Kindly call for questions     Eather Popp, MD Medical Director [facility] Pager: [phone] 07/12/2024 1:01 PM

## 2024-07-12 NOTE — Plan of Care (Signed)

## 2024-07-12 NOTE — Progress Notes (Addendum)
 HD#2 SUBJECTIVE:  Patient Summary: Darlene Pugh is a 63 y.o. person living with a history of type 2 diabetes, hypertension, osteoarthritis, vitamin D deficiency who presented with right-sided weakness after being found down at home and admitted for evaluation and management of acute CVA.   Overnight Events: NAE  Interim History: Patient was seen and examined at the bedside this morning she was visibly upset noting that it is very frustrating that she cannot move her right side of her body and she does feel as if it is becoming weaker.  Upon our conversation she did become teary at acknowledgment of the stroke and subsequent deficits.  OBJECTIVE:  Vital Signs: Vitals:   07/12/24 0418 07/12/24 0819 07/12/24 1142 07/12/24 1641  BP: (!) 137/90 (!) 154/88 (!) 153/84 (!) 172/91  Pulse: 92 79 90 89  Resp: 17 18 18 18   Temp: 98.7 F (37.1 C) 98 F (36.7 C) 98.6 F (37 C) 99.1 F (37.3 C)  TempSrc: Oral  Oral Oral  SpO2: 97% 97% 97% 94%  Weight:      Height:       Supplemental O2: Room Air SpO2: 94 % O2 Flow Rate (L/min): 2 L/min  Filed Weights   07/10/24 1000 07/10/24 1047  Weight: 76.2 kg 76.2 kg     Intake/Output Summary (Last 24 hours) at 07/12/2024 1819 Last data filed at 07/12/2024 1801 Gross per 24 hour  Intake 1276.52 ml  Output 350 ml  Net 926.52 ml   Net IO Since Admission: 516.52 mL [07/12/24 1819]  Physical Exam: Const: Awake, alert in NAD HENT: Normocephalic, atraumatic, mucus membranes moist Card: RRR, No MRG, No pitting edema on LE's bilaterally  Resp: Crackles noted bilaterally.  No increased work of breathing. Abd: Soft, NTND, Bsx4 Extremities: Warm, pink Neuro: Right facial droop noted.  3/5 strength in right upper extremity, 3/ 5 strength in right hip flexion, 5/5 in plantar and dorsiflexion. Skin: Dry with tenting over anterior chest and arms  Patient Lines/Drains/Airways Status     Active Line/Drains/Airways     Name Placement date  Placement time Site Days   Peripheral IV 07/11/24 22 G 1 Anterior;Left Forearm 07/11/24  1621  Forearm  1            Pertinent labs and imaging:      Latest Ref Rng & Units 07/12/2024    1:50 AM 07/11/2024    2:37 AM 07/10/2024   10:21 AM  CBC  WBC 4.0 - 10.5 K/uL 8.3  8.1    Hemoglobin 12.0 - 15.0 g/dL 85.4  84.7  84.3   Hematocrit 36.0 - 46.0 % 43.9  45.5  46.0   Platelets 150 - 400 K/uL 261  292         Latest Ref Rng & Units 07/12/2024    1:50 AM 07/11/2024    2:37 AM 07/10/2024   10:21 AM  CMP  Glucose 70 - 99 mg/dL 864  816  794   BUN 8 - 23 mg/dL 10  11  7    Creatinine 0.44 - 1.00 mg/dL 9.05  9.12  9.39   Sodium 135 - 145 mmol/L 143  140  143   Potassium 3.5 - 5.1 mmol/L 3.7  4.1  3.1   Chloride 98 - 111 mmol/L 102  102  106   CO2 22 - 32 mmol/L 30  24    Calcium  8.9 - 10.3 mg/dL 9.0  9.5    Total Protein 6.5 - 8.1  g/dL 6.1     Total Bilirubin 0.0 - 1.2 mg/dL 3.0     Alkaline Phos 38 - 126 U/L 73     AST 15 - 41 U/L 15     ALT 0 - 44 U/L 13       No results found.   ASSESSMENT/PLAN:  Assessment: Principal Problem:   Acute CVA (cerebrovascular accident) (HCC) Active Problems:   Hemiparesis of right dominant side due to acute cerebrovascular disease (HCC)   Dysphagia due to recent cerebrovascular accident (CVA)   Dysarthria due to acute cerebrovascular accident (CVA) (HCC)   Cough, unspecified; aspiration (related to CVA) suspected, no pneumonia   Hypokalemia   T2DM (type 2 diabetes mellitus) (HCC)   HTN (hypertension)   Tobacco use disorder   Hyperlipidemia   Adjustment reaction with anxiety and depression   Plan: #Acute left internal capsule/basal ganglia CVA #Right Hemiparesis #Dysphagia #Dysarthria -The patient was admitted on 12/1 with subsequent MRI findings of a left intracranial capsule/basal ganglia CVA. - Today on exam, she is noticeably weaker in the right upper and lower extremity.  This is likely secondary to episodes of hypotension  yesterday. -Neurology consult appreciate their recommendations -TTE without atrial shunt -Continue aspirin  and Plavix ---Plavix  for 3 weeks followed by aspirin  alone  -PT/OT evaluated yesterday and recommended CIR to which the patient is agreeable. Consult placed. -SLP following and recommended a dysphagia 2 with nectar thick liquids. -Will give maintenance fluids today as patient is very dry on exam with dark urine and skin tenting  #Hypoxia #Hypotension -Resolved.  -Continue to monitor closely  #Type 2 diabetes, uncontrolled Last A1c 9.2 Continue sliding scale insulin   # Nicotine  dependence The patient has significant History of cigarello use. Continue nicotine  patch  #Alcohol use disorder -The patient was to drink several glasses of liquor a day with additional beer occasionally. -She does not have a history of withdrawal however will continue CIWA checks to monitor for signs and symptoms of withdrawal. -Last alcoholic beverage was 11/30  Best Practice: Diet: Dysphagia 2 with nectar thick liquids IVF: Fluids: LR, Rate: 1000 cc bolus VTE: enoxaparin (LOVENOX) injection 40 mg Start: 07/10/24 1445 Code: Full  Disposition planning: Therapy Recs: Pending,  DISPO: Pending clinical improvement  Signature:  Schuyler Novak, DO [facility]  6:19 PM, 07/12/2024  On Call pager [phone]  Internal Medicine Attending:   I was physically present during the key portions of the resident provided service and participated in medical decision making of the patient's management care in the assessment and plan.   Ms. Scearce has sustained acute L subcortical CVA with sequelae of R hemiparesis, dysarthria, and dysphagia due to uncontrolled HTN, uncontrolled T2DM, and uncontrolled hyperlipidemia - and with tobacco use disorder.  She was unusually (relatively) hypotensive over past couple of days despite not receiving antihypertensive medication, and has been noted to be  dehydrated, for which she has received IVF with appropriate BP response.  Clinically she remains volume depleted by skin turgor and concentrated urine; she currently cannot maintain sufficient oral hydration due to limitations of her dysphagia and needing assistance (her dominant side is weak). We have concern that her relatively low cerebral perfusion pressures following her acute CVA may have caused some worsening of her deficits; she is weaker now on R side than at admission.  She is tearful and frightened as would be expected and we discussed the merits of beginning antidepressant now, acknowledging that it will take time for efficacy.  We anticipate  SNF transfer for ongoing therapy (CIR if home support available after discharge).  Foley catheter placement has occurred and we'll investigate how this came about. Continue management of DM/lipids, watch BP, continue therapies, support emotionally.  She declined chaplain visit today but knows it's available.

## 2024-07-12 NOTE — Progress Notes (Signed)
 Foley catheter discontinued per order. Patient is due to void @ 2341PM.

## 2024-07-12 NOTE — Progress Notes (Signed)
  Inpatient Rehabilitation Admissions Coordinator   Met with patient, daughter, Sharene and nephew at bedside for rehab assessment. We discussed goals and expectations of a possible CIR admit.  Prior to admit she lived alone, independent and diving. Latoya to discuss with family caregiver supports needed to return home after a CIR admit vs need for SNF rehab if caregiver supports not available. I will follow up tomorrow. Please call me with any questions.   Heron Leavell, RN, MSN Rehab Admissions Coordinator [phone]

## 2024-07-12 NOTE — Hospital Course (Addendum)
   Family hx: Sisters: brain older Younger: esophageal cancer Mother: positive hx of cancer, deceased       Aug 06, 2024  Denied headache, vision change. Has headaches which is dry for her,  Exam:   - Right arm motor:Limited ROM in right hand: strength: 3/5, shoulder and upper arm engagement in movement in right arm.   Sensation:Symmetrical sensation in upper ext  - Right foot motor: 5/5 - right leg motor:  1-2/5

## 2024-07-12 NOTE — Inpatient Diabetes Management (Signed)
 Inpatient Diabetes Program Recommendations  AACE/ADA: New Consensus Statement on Inpatient Glycemic Control (2015)  Target Ranges:  Prepandial:   less than 140 mg/dL      Peak postprandial:   less than 180 mg/dL (1-2 hours)      Critically ill patients:  140 - 180 mg/dL   Lab Results  Component Value Date   GLUCAP 267 (H) 07/12/2024   HGBA1C 9.5 (H) 07/10/2024    Review of Glycemic Control  Latest Reference Range & Units 07/12/24 08:19 07/12/24 11:44  Glucose-Capillary 70 - 99 mg/dL 844 (H) 732 (H)  (H): Data is abnormally high  Diabetes history: DM2 Outpatient Diabetes medications: Metformin  500 mg every day, Farxiga 10 mg QD Current orders for Inpatient glycemic control: Novolog  0-15 units TID and 0-5 units QHS  Inpatient Diabetes Program Recommendations:    Met with family yesterday briefly; they do not know her DM medication history.  DYS 2 with nectar thick fluids started last evening.  Appears postprandial is elevated at noon today.  If PP remain elevated, might consider:  Novolog  2-3 units TID with meals if she eats at least 50%.  Thank you, Wyvonna Pinal, MSN, CDCES Diabetes Coordinator Inpatient Diabetes Program [phone] (team pager from 8a-5p)

## 2024-07-13 DIAGNOSIS — M25462 Effusion, left knee: Secondary | ICD-10-CM | POA: Insufficient documentation

## 2024-07-13 DIAGNOSIS — M25562 Pain in left knee: Secondary | ICD-10-CM

## 2024-07-13 LAB — GLUCOSE, CAPILLARY
Glucose-Capillary: 221 mg/dL — ABNORMAL HIGH (ref 70–99)
Glucose-Capillary: 187 mg/dL — ABNORMAL HIGH (ref 70–99)
Glucose-Capillary: 191 mg/dL — ABNORMAL HIGH (ref 70–99)
Glucose-Capillary: 253 mg/dL — ABNORMAL HIGH (ref 70–99)

## 2024-07-13 LAB — BASIC METABOLIC PANEL WITH GFR
Anion gap: 11 (ref 5–15)
BUN: 6 mg/dL — ABNORMAL LOW (ref 8–23)
CO2: 24 mmol/L (ref 22–32)
Calcium: 8.2 mg/dL — ABNORMAL LOW (ref 8.9–10.3)
Chloride: 102 mmol/L (ref 98–111)
Creatinine, Ser: 0.52 mg/dL (ref 0.44–1.00)
GFR, Estimated: >60 mL/min (ref >60)
Glucose, Bld: 150 mg/dL — ABNORMAL HIGH (ref 70–99)
Potassium: 3.8 mmol/L (ref 3.5–5.1)
Sodium: 137 mmol/L (ref 135–145)

## 2024-07-13 LAB — VITAMIN B1: Vitamin B1 (Thiamine): 134.2 nmol/L (ref 66.5–200.0)

## 2024-07-13 LAB — LDL CHOLESTEROL, DIRECT: Direct LDL: 170 mg/dL — ABNORMAL HIGH (ref 0–99)

## 2024-07-13 MED ORDER — LOSARTAN POTASSIUM 25 MG PO TABS
25.0000 mg | ORAL_TABLET | Freq: Every day | ORAL | Status: DC
  Administered 2024-07-14 – 2024-07-19 (×6): 25 mg via ORAL
  Filled 2024-07-13 (×6): qty 1

## 2024-07-13 MED ORDER — AMLODIPINE BESYLATE 5 MG PO TABS
10.0000 mg | ORAL_TABLET | Freq: Every day | ORAL | Status: DC
  Administered 2024-07-13 – 2024-07-14 (×2): 10 mg via ORAL
  Filled 2024-07-13 (×2): qty 2

## 2024-07-13 MED ORDER — INSULIN GLARGINE-YFGN 100 UNIT/ML ~~LOC~~ SOLN
7.0000 [IU] | Freq: Every day | SUBCUTANEOUS | Status: DC
  Administered 2024-07-13: 7 [IU] via SUBCUTANEOUS
  Filled 2024-07-13 (×2): qty 0.07

## 2024-07-13 MED ORDER — KETOROLAC TROMETHAMINE 15 MG/ML IJ SOLN
15.0000 mg | Freq: Four times a day (QID) | INTRAMUSCULAR | Status: DC
  Administered 2024-07-13 – 2024-07-14 (×4): 15 mg via INTRAVENOUS
  Filled 2024-07-13 (×4): qty 1

## 2024-07-13 MED ORDER — DICLOFENAC SODIUM 1 % EX GEL
2.0000 g | Freq: Four times a day (QID) | CUTANEOUS | Status: DC
  Administered 2024-07-13 – 2024-07-19 (×17): 2 g via TOPICAL
  Filled 2024-07-13: qty 100

## 2024-07-13 MED ORDER — AMLODIPINE BESYLATE 5 MG PO TABS: 5.0000 mg | ORAL_TABLET | Freq: Every day | ORAL | Status: DC

## 2024-07-13 NOTE — Plan of Care (Signed)
   Problem: Coping: Goal: Ability to adjust to condition or change in health will improve Outcome: Progressing

## 2024-07-13 NOTE — Progress Notes (Signed)
 Occupational Therapy Treatment Patient Details Name: Darlene Pugh MRN: [id number] DOB: 05-08-1961 Today's Date: 07/13/2024   History of present illness Pt is a 63 y.o. female presenting 12/01 after being found down at home. MRI brain with acute infarct involving the left internal capsule/basal ganglia. 2. Chronic small vessel ischemic disease with multiple chronic lacunar infarcts. PMH: DM, HTN, vitamin D deficiency, osteoarthritis.   OT comments  Patient received seated in recliner and agreeable to OT/PT treatment. Patient stating she had soiled self in recliner and required cleaning.  Patient was max assist +2 to stand into stedy and max assist for cleaning but patient did not have a BM. Patient was taken to sink while in [place] and performed grooming and standing tasks from [place] with max assist +2.  Patient asking to return to supine at end of session due to fatigue.  Patient will benefit from intensive inpatient follow-up therapy, >3 hours/day.  Acute OT to continue to follow to address established goals to facilitate DC to next venue of care.        If plan is discharge home, recommend the following:  Two people to help with walking and/or transfers;Assist for transportation;Help with stairs or ramp for entrance;A lot of help with bathing/dressing/bathroom;Assistance with cooking/housework;Two people to help with bathing/dressing/bathroom   Equipment Recommendations  Other (comment) (defer)    Recommendations for Other Services      Precautions / Restrictions Precautions Precautions: Fall Recall of Precautions/Restrictions: Impaired Restrictions Weight Bearing Restrictions Per Provider Order: No       Mobility Bed Mobility Overal bed mobility: Needs Assistance Bed Mobility: Sit to Supine       Sit to supine: Mod assist, +2 for safety/equipment, +2 for physical assistance   General bed mobility comments: +2 Mod A to scoot up in bed and for BLE management.     Transfers Overall transfer level: Needs assistance Equipment used: 2 person hand held assist, Ambulation equipment used Transfers: Sit to/from Stand, Bed to chair/wheelchair/BSC Sit to Stand: +2 physical assistance, +2 safety/equipment, Max assist, Via lift equipment           General transfer comment: used bed pad to assist with hips to stand from recliner and Stedy pads. Transfer via Lift Equipment: Stedy   Balance Overall balance assessment: Needs assistance Sitting-balance support: Bilateral upper extremity supported, Feet supported Sitting balance-Leahy Scale: Poor Sitting balance - Comments: mod assist for sitting in stedy and on EOB due to right lateral leaning Postural control: Right lateral lean Standing balance support: Bilateral upper extremity supported, During functional activity, Reliant on assistive device for balance Standing balance-Leahy Scale: Zero Standing balance comment: Reliant on therapists                           ADL either performed or assessed with clinical judgement   ADL Overall ADL's : Needs assistance/impaired     Grooming: Wash/dry hands;Wash/dry face;Oral care;Minimal assistance;Sitting Grooming Details (indicate cue type and reason): at sink seated in [place]     Lower Body Bathing: Maximal assistance;Sit to/from stand Lower Body Bathing Details (indicate cue type and reason): stood in [place] for peri area bathing                            Extremity/Trunk Assessment              Vision       Perception  Praxis     Communication Communication Communication: Impaired Factors Affecting Communication: Difficulty expressing self;Reduced clarity of speech   Cognition Arousal: Alert Behavior During Therapy: Flat affect, WFL for tasks assessed/performed Cognition: Cognition impaired     Awareness: Online awareness impaired Memory impairment (select all impairments): Short-term memory, Working civil service fast streamer,  Conservation officer, historic buildings Attention impairment (select first level of impairment): Sustained attention Executive functioning impairment (select all impairments): Organization, Problem solving                   Following commands: Impaired Following commands impaired: Follows one step commands with increased time      Cueing   Cueing Techniques: Verbal cues, Gestural cues, Tactile cues  Exercises      Shoulder Instructions       General Comments      Pertinent Vitals/ Pain       Pain Assessment Pain Assessment: Faces Faces Pain Scale: Hurts little more Pain Location: L knee and bottom when seated in recliner Pain Descriptors / Indicators: Sore, Aching, Discomfort Pain Intervention(s): Limited activity within patient's tolerance, Monitored during session, Repositioned  Home Living                                          Prior Functioning/Environment              Frequency  Min 2X/week        Progress Toward Goals  OT Goals(current goals can now be found in the care plan section)  Progress towards OT goals: Progressing toward goals  Acute Rehab OT Goals Patient Stated Goal: to lay down OT Goal Formulation: With patient Time For Goal Achievement: 07/25/24 Potential to Achieve Goals: Good ADL Goals Pt Will Perform Grooming: with set-up;sitting Pt Will Perform Upper Body Dressing: with set-up;sitting Pt Will Perform Lower Body Dressing: with min assist;sit to/from stand Pt Will Transfer to Toilet: with min assist;ambulating Pt/caregiver will Perform Home Exercise Program: Right Upper extremity;With written HEP provided;Increased strength  Plan      Co-evaluation    PT/OT/SLP Co-Evaluation/Treatment: Yes Reason for Co-Treatment: To address functional/ADL transfers;For patient/therapist safety   OT goals addressed during session: ADL's and self-care      AM-PAC OT 6 Clicks Daily Activity     Outcome Measure   Help from  another person eating meals?: A Little Help from another person taking care of personal grooming?: A Little Help from another person toileting, which includes using toliet, bedpan, or urinal?: Total Help from another person bathing (including washing, rinsing, drying)?: A Lot Help from another person to put on and taking off regular upper body clothing?: A Lot Help from another person to put on and taking off regular lower body clothing?: Total 6 Click Score: 12    End of Session Equipment Utilized During Treatment: Gait belt;Other (comment) Laurent)  OT Visit Diagnosis: Unsteadiness on feet (R26.81);Muscle weakness (generalized) (M62.81);Other symptoms and signs involving cognitive function;Hemiplegia and hemiparesis Hemiplegia - Right/Left: Right Hemiplegia - dominant/non-dominant: Dominant Hemiplegia - caused by: Cerebral infarction   Activity Tolerance Patient tolerated treatment well   Patient Left in bed;with call bell/phone within reach;with bed alarm set;with family/visitor present   Nurse Communication Mobility status        Time: 9077-9051 OT Time Calculation (min): 26 min  Charges: OT General Charges $OT Visit: 1 Visit OT Treatments $Self Care/Home Management : 8-22 mins  Dick  Dusty, OTA Acute Rehabilitation Services  Office [phone]   Jeb LITTIE Dusty 07/13/2024, 2:02 PM

## 2024-07-13 NOTE — Progress Notes (Addendum)
 HD#3 SUBJECTIVE:  Patient Summary: Darlene Pugh is a 63 y.o. person living with a history of type 2 diabetes, hypertension, osteoarthritis, vitamin D deficiency who presented with right-sided weakness after being found down at home and admitted for CVA on hospital day 3.  Overnight Events:No events overnight  Interim History: The patient was seen and examined at the bedside this morning. She is feeling better than yesterday and is visiting with her daughter at the bedside. C/o L knee pain.  Foley was removed successfully.   OBJECTIVE:  Vital Signs: Vitals:   07/13/24 0252 07/13/24 0736 07/13/24 1123 07/13/24 1211  BP: (!) 157/91 (!) 172/84 (!) 196/90 (!) 172/85  Pulse: 87 88 73   Resp: 18 16 16    Temp: 99.2 F (37.3 C) 99 F (37.2 C) 98.7 F (37.1 C)   TempSrc: Oral Oral Oral   SpO2: 98% 96% 92%   Weight:      Height:       Supplemental O2: Room Air SpO2: 92 % O2 Flow Rate (L/min): 2 L/min  Filed Weights   07/10/24 1000 07/10/24 1047  Weight: 76.2 kg 76.2 kg     Intake/Output Summary (Last 24 hours) at 07/13/2024 1332 Last data filed at 07/13/2024 1300 Gross per 24 hour  Intake 2247.89 ml  Output 2150 ml  Net 97.89 ml   Net IO Since Admission: -422.11 mL [07/13/24 1332]  Physical Exam: Const: Awake, alert in NAD HENT: Normocephalic, atraumatic, mucus membranes moist Card: RRR, No MRG, No pitting edema on LE's bilaterally  Resp: No increased work of breathing. Cough present with increased secretion burden managed with self-suction.  Abd: Soft, NTND, Bsx4 Extremities: Warm. Left knee with mild effusion, warmth and tenderness to palpation primarily over joint lines, no erythema. R 5th digit hyperextended from previous fall and fracture (remote).  Neuro: Right facial droop noted,  unchanged.  Weakness noted in right upper (perhaps improved from yesterday and right lower extremity (unchanged from yesterday)--RUE improved from yesterday with elbow flexion.    Patient Lines/Drains/Airways Status     Active Line/Drains/Airways     Name Placement date Placement time Site Days   Peripheral IV 07/13/24 22 G 1 Posterior;Right Forearm 07/13/24  0020  Forearm  less than 1   External Urinary Catheter 07/13/24  0726  --  less than 1            Pertinent labs and imaging:      Latest Ref Rng & Units 07/12/2024    1:50 AM 07/11/2024    2:37 AM 07/10/2024   10:21 AM  CBC  WBC 4.0 - 10.5 K/uL 8.3  8.1    Hemoglobin 12.0 - 15.0 g/dL 85.4  84.7  84.3   Hematocrit 36.0 - 46.0 % 43.9  45.5  46.0   Platelets 150 - 400 K/uL 261  292         Latest Ref Rng & Units 07/13/2024    7:22 AM 07/12/2024    1:50 AM 07/11/2024    2:37 AM  CMP  Glucose 70 - 99 mg/dL 849  864  816   BUN 8 - 23 mg/dL 6  10  11    Creatinine 0.44 - 1.00 mg/dL 9.47  9.05  9.12   Sodium 135 - 145 mmol/L 137  143  140   Potassium 3.5 - 5.1 mmol/L 3.8  3.7  4.1   Chloride 98 - 111 mmol/L 102  102  102   CO2 22 - 32 mmol/L  24  30  24    Calcium  8.9 - 10.3 mg/dL 8.2  9.0  9.5   Total Protein 6.5 - 8.1 g/dL  6.1    Total Bilirubin 0.0 - 1.2 mg/dL  3.0    Alkaline Phos 38 - 126 U/L  73    AST 15 - 41 U/L  15    ALT 0 - 44 U/L  13      ASSESSMENT/PLAN:  Assessment: Principal Problem:   Acute CVA (cerebrovascular accident) (HCC) Active Problems:   Hypokalemia   T2DM (type 2 diabetes mellitus) (HCC)   HTN (hypertension)   Tobacco use disorder   Alcohol use disorder   Hyperlipidemia   Hemiparesis of right dominant side due to acute cerebrovascular disease (HCC)   Dysphagia due to recent cerebrovascular accident (CVA)   Adjustment reaction with anxiety and depression   Dysarthria due to acute cerebrovascular accident (CVA) (HCC)   Cough, unspecified; aspiration (related to CVA) suspected, no pneumonia   Plan: #Acute left internal capsule/basal ganglia CVA #Right Hemiparesis #Dysphagia #Dysarthria -The patient was admitted on 12/1 with subsequent MRI findings of a  left intracranial capsule/basal ganglia CVA. -Today on exam, muscle strength improved from yesterday in RUE.  -Neurology signed off yesterday.  -TTE without atrial shunt -Continue aspirin  and Plavix --Plavix  for 3 weeks followed by aspirin  alone  -PT/OT recommended CIR, referral placed and conversation with family about discharge planning underway.  -SLP following and continue to recommend Dysphagia 2 with nectar thick liquids. Fluid status improved after maintenance yesterday. Will d/c fluids and encourage oral intake.   #Left knee pain -Patient is reporting significant pain in her left knee as a result of fall PTA.  No redness but is warm and very tender to palpation within effusion noted on exam. She may have had a traumatic hemarthrosis, or this may be simply reactionary effusion. NSAID will be scheduled (on PPI while on DAPT).  This will be limiting to her rehab as it is her GOOD leg! -Will order ice packs and Ace wrap with elevation today. -If symptoms continue to worsen, will consider x-ray.  #Hypertension -The patient's BP is elevated today at 172/85. Goal SBP < 130. Will transition home lisinopril  to losartan  (taking opportunity to make change to minimize risk of side effects of ACEI in long term) and add amlodipine .  -She does remain asymptomatic upon evaluation this morning.   #Urinary retention, resolved (behavioral) - Patient was experiencing urinary retention and did require straight cath overnight. - We did discuss today, and she did report that she was having trouble urinating in front of people meaning the nursing staff. - She has begun spontaneously voiding today with appropriate PVR's  #Type 2 diabetes, uncontrolled Last A1c 9.2 Semglee 7 units added.  Continue sliding scale insulin .  # Nicotine  dependence The patient has significant History of cigarello use. Continue nicotine  patch high dose  #Alcohol use disorder -The patient was to drink several glasses of liquor a  day with additional beer occasionally. - No signs of withdrawal this hospitalization. - Bilirubin incidentally elevated however LFTs normal on last CMP.  Will recheck CMP in the morning and monitor trend.  Notably, no right upper quadrant pain on exam.  #Depression Had bedside conversation today about the high likelihood of depression after stroke, and availability of medications to help her with this.  She would like to further think and discuss with her family; she has resistance to taking medicines generally (hence uncontrolled stroke risk factors).   #Disposition Ms.  Pugh will need either CIR or SNF level therapies.  We appreciate TOC conversations behind the seens.  Darlene Pugh is very frustrated as anyone would be with her new functional deficits. Her L knee pain will need to be taken into consideration during rehab.  We know the PT folks have got this!   Best Practice: Diet: Dysphagia 2 with nectar thick liquids IVF: Fluids: LR, Rate: 1000 cc bolus VTE: enoxaparin (LOVENOX) injection 40 mg Start: 07/10/24 1445 Code: Full  Disposition planning: Therapy Recs: Pending,  DISPO: Pending clinical improvement  Signature:  Schuyler Novak, DO [facility]  1:32 PM, 07/13/2024  On Call pager [phone]

## 2024-07-13 NOTE — Progress Notes (Signed)
 Physical Therapy Treatment Patient Details Name: Darlene Pugh MRN: [id number] DOB: [birth year] Today's Date: 07/13/2024   History of Present Illness Pt is a 63 y.o. female presenting 12/01 after being found down at home. MRI brain with acute infarct involving the left internal capsule/basal ganglia. 2. Chronic small vessel ischemic disease with multiple chronic lacunar infarcts. PMH: DM, HTN, vitamin D deficiency, osteoarthritis.    PT Comments  Pt tolerated treatment well today. Co-treat with OT. Pt able to perform 8 sit to stands in stedy with +2 Max A. Able to perform ADLs at sink in stedy with OT. 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 Up to Mod A for sitting balance in stedy. No change in DC/DME recs at this time. PT will continue to follow.     If plan is discharge home, recommend the following: Two people to help with walking and/or transfers;A lot of help with bathing/dressing/bathroom;Assistance with cooking/housework;Assistance with feeding;Direct supervision/assist for medications management;Direct supervision/assist for financial management;Assist for transportation;Help with stairs or ramp for entrance;Supervision due to cognitive status   Can travel by private vehicle        Equipment Recommendations  Wheelchair (measurements PT);Wheelchair cushion (measurements PT);Hospital bed;Hoyer lift    Recommendations for Other Services       Precautions / Restrictions Precautions Precautions: Fall Recall of Precautions/Restrictions: Impaired Restrictions Weight Bearing Restrictions Per Provider Order: No     Mobility  Bed Mobility Overal bed mobility: Needs Assistance Bed Mobility: Sit to Supine       Sit to supine: Mod assist, +2 for safety/equipment, +2 for physical assistance   General bed mobility comments: +2 Mod A to scoot up in bed and for BLE management.    Transfers Overall transfer level: Needs assistance Equipment used: 2 person  hand held assist Transfers: Sit to/from Stand, Bed to chair/wheelchair/BSC Sit to Stand: +2 physical assistance, +2 safety/equipment, Max assist, Via lift equipment           General transfer comment: Pt able to perform 8 sit to stands in stedy with +2 Max A. Able to perform ADLs at sink in stedy with OT. 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 Up to Mod A for sitting balance in stedy. Transfer via Lift Equipment: Stedy  Ambulation/Gait               General Gait Details: Deferred for safety   Stairs             Wheelchair Mobility     Tilt Bed    Modified Rankin (Stroke Patients Only) Modified Rankin (Stroke Patients Only) Pre-Morbid Rankin Score: No symptoms Modified Rankin: Moderate disability     Balance Overall balance assessment: Needs assistance Sitting-balance support: Bilateral upper extremity supported, Feet supported Sitting balance-Leahy Scale: Poor Sitting balance - Comments: Up to Mod A in stedy   Standing balance support: Bilateral upper extremity supported, During functional activity, Reliant on assistive device for balance Standing balance-Leahy Scale: Zero Standing balance comment: Reliant on therapists                            Communication Communication Communication: Impaired Factors Affecting Communication: Difficulty expressing self;Reduced clarity of speech  Cognition Arousal: Alert Behavior During Therapy: Flat affect, WFL for tasks assessed/performed                             Following commands: Impaired Following  commands impaired: Follows one step commands with increased time    Cueing Cueing Techniques: Verbal cues, Gestural cues, Tactile cues  Exercises      General Comments General comments (skin integrity, edema, etc.): VSS      Pertinent Vitals/Pain Pain Assessment Pain Assessment: Faces Faces Pain Scale: Hurts little more Pain Location: L knee Pain Descriptors /  Indicators: Sore, Aching, Discomfort Pain Intervention(s): Monitored during session, Limited activity within patient's tolerance, Repositioned    Home Living                          Prior Function            PT Goals (current goals can now be found in the care plan section) Progress towards PT goals: Progressing toward goals    Frequency    Min 2X/week      PT Plan      Co-evaluation              AM-PAC PT 6 Clicks Mobility   Outcome Measure  Help needed turning from your back to your side while in a flat bed without using bedrails?: A Lot Help needed moving from lying on your back to sitting on the side of a flat bed without using bedrails?: A Lot Help needed moving to and from a bed to a chair (including a wheelchair)?: A Lot Help needed standing up from a chair using your arms (e.g., wheelchair or bedside chair)?: A Lot Help needed to walk in hospital room?: Total Help needed climbing 3-5 steps with a railing? : Total 6 Click Score: 10    End of Session Equipment Utilized During Treatment: Gait belt Activity Tolerance: Patient tolerated treatment well Patient left: in bed;with call bell/phone within reach;with bed alarm set (Refused to get back in chair) Nurse Communication: Mobility status;Need for lift equipment PT Visit Diagnosis: Other abnormalities of gait and mobility (R26.89)     Time: 9077-9050 PT Time Calculation (min) (ACUTE ONLY): 27 min  Charges:    $Therapeutic Activity: 8-22 mins PT General Charges $$ ACUTE PT VISIT: 1 Visit                     Sueellen NOVAK, PT, DPT Acute Rehab Services [phone]    Darlene Pugh 07/13/2024, 11:20 AM

## 2024-07-13 NOTE — Inpatient Diabetes Management (Signed)
 Inpatient Diabetes Program Recommendations  AACE/ADA: New Consensus Statement on Inpatient Glycemic Control (2015)  Target Ranges:  Prepandial:   less than 140 mg/dL      Peak postprandial:   less than 180 mg/dL (1-2 hours)      Critically ill patients:  140 - 180 mg/dL   Lab Results  Component Value Date   GLUCAP 187 (H) 07/13/2024   HGBA1C 9.5 (H) 07/10/2024    Review of Glycemic Control  Latest Reference Range & Units 07/12/24 08:19 07/12/24 11:44 07/12/24 16:17 07/12/24 20:28 07/13/24 05:53 07/13/24 11:24  Glucose-Capillary 70 - 99 mg/dL 844 (H) 732 (H) 863 (H) 170 (H) 221 (H) 187 (H)  Diabetes history: DM2 Outpatient Diabetes medications: Metformin  500 mg every day, Farxiga  10 mg QD Current orders for Inpatient glycemic control: Novolog  0-15 units TID and 0-5 units QHS  Inpatient Diabetes Program Recommendations:   Fasting glucose=221 mg/dL.  Consider adding low dose basal insulin , Semglee  10 units daily.   Thanks,  Randall Bullocks, RN, BC-ADM Inpatient Diabetes Coordinator Pager [phone]  (8a-5p)

## 2024-07-14 ENCOUNTER — Other Ambulatory Visit (HOSPITAL_COMMUNITY): Payer: Self-pay

## 2024-07-14 ENCOUNTER — Telehealth (HOSPITAL_COMMUNITY): Payer: Self-pay

## 2024-07-14 DIAGNOSIS — N390 Urinary tract infection, site not specified: Secondary | ICD-10-CM

## 2024-07-14 LAB — COMPREHENSIVE METABOLIC PANEL WITH GFR
ALT: 13 U/L (ref 0–44)
AST: 12 U/L — ABNORMAL LOW (ref 15–41)
Albumin: 2.7 g/dL — ABNORMAL LOW (ref 3.5–5.0)
Alkaline Phosphatase: 74 U/L (ref 38–126)
Anion gap: 13 (ref 5–15)
BUN: 10 mg/dL (ref 8–23)
CO2: 29 mmol/L (ref 22–32)
Calcium: 8.8 mg/dL — ABNORMAL LOW (ref 8.9–10.3)
Chloride: 97 mmol/L — ABNORMAL LOW (ref 98–111)
Creatinine, Ser: 0.81 mg/dL (ref 0.44–1.00)
GFR, Estimated: >60 mL/min (ref >60)
Glucose, Bld: 131 mg/dL — ABNORMAL HIGH (ref 70–99)
Potassium: 3.4 mmol/L — ABNORMAL LOW (ref 3.5–5.1)
Sodium: 139 mmol/L (ref 135–145)
Total Bilirubin: 2.6 mg/dL — ABNORMAL HIGH (ref 0.0–1.2)
Total Protein: 6.1 g/dL — ABNORMAL LOW (ref 6.5–8.1)

## 2024-07-14 LAB — GLUCOSE, CAPILLARY
Glucose-Capillary: 145 mg/dL — ABNORMAL HIGH (ref 70–99)
Glucose-Capillary: 226 mg/dL — ABNORMAL HIGH (ref 70–99)
Glucose-Capillary: 239 mg/dL — ABNORMAL HIGH (ref 70–99)
Glucose-Capillary: 259 mg/dL — ABNORMAL HIGH (ref 70–99)

## 2024-07-14 LAB — URINALYSIS, ROUTINE W REFLEX MICROSCOPIC
Bilirubin Urine: NEGATIVE
Glucose, UA: NEGATIVE mg/dL
Hgb urine dipstick: NEGATIVE
Ketones, ur: NEGATIVE mg/dL
Nitrite: NEGATIVE
Protein, ur: NEGATIVE mg/dL
Specific Gravity, Urine: 1.019 (ref 1.005–1.030)
WBC, UA: >50 WBC/hpf (ref 0–5)
pH: 5 (ref 5.0–8.0)

## 2024-07-14 LAB — CBC
HCT: 39.3 % (ref 36.0–46.0)
Hemoglobin: 13.4 g/dL (ref 12.0–15.0)
MCH: 32.7 pg (ref 26.0–34.0)
MCHC: 34.1 g/dL (ref 30.0–36.0)
MCV: 95.9 fL (ref 80.0–100.0)
Platelets: 256 K/uL (ref 150–400)
RBC: 4.1 MIL/uL (ref 3.87–5.11)
RDW: 12.3 % (ref 11.5–15.5)
WBC: 7.7 K/uL (ref 4.0–10.5)
nRBC: 0 % (ref 0.0–0.2)

## 2024-07-14 MED ORDER — RIVAROXABAN 10 MG PO TABS
10.0000 mg | ORAL_TABLET | Freq: Every day | ORAL | Status: DC
  Administered 2024-07-14 – 2024-07-17 (×4): 10 mg via ORAL
  Filled 2024-07-14 (×4): qty 1

## 2024-07-14 MED ORDER — LACTATED RINGERS IV SOLN: INTRAVENOUS | Status: AC

## 2024-07-14 MED ORDER — POTASSIUM CHLORIDE CRYS ER 20 MEQ PO TBCR
40.0000 meq | EXTENDED_RELEASE_TABLET | Freq: Once | ORAL | Status: AC
  Administered 2024-07-14: 40 meq via ORAL
  Filled 2024-07-14: qty 2

## 2024-07-14 MED ORDER — INSULIN GLARGINE 100 UNIT/ML ~~LOC~~ SOLN
7.0000 [IU] | Freq: Every day | SUBCUTANEOUS | Status: DC
  Administered 2024-07-14: 7 [IU] via SUBCUTANEOUS
  Filled 2024-07-14 (×2): qty 0.07

## 2024-07-14 NOTE — Progress Notes (Signed)
 Physical Therapy Treatment Patient Details Name: Darlene Pugh MRN: [id number] DOB: 1960/11/02 Today's Date: 07/14/2024   History of Present Illness Pt is a 63 y.o. female presenting 12/01 after being found down at home. MRI brain with acute infarct involving the left internal capsule/basal ganglia. 2. Chronic small vessel ischemic disease with multiple chronic lacunar infarcts. PMH: DM, HTN, vitamin D deficiency, osteoarthritis.    PT Comments  Pt tolerated treatment well today. Pt today made signficant progress compared to previous session. Able to perform sit to stands x5 with +2 Min A. First 2 sit to stands were from stedy with final 3 via HHA. Pt able to perform standing marches, weight shifts, and sidesteps while standing today. Mirror provided for visual feedback with pt able to achieve midline rather quickly and consistently today. No change in DC/DME recs at this time. PT will continue to follow.    If plan is discharge home, recommend the following: Two people to help with walking and/or transfers;A lot of help with bathing/dressing/bathroom;Assistance with cooking/housework;Assistance with feeding;Direct supervision/assist for medications management;Direct supervision/assist for financial management;Assist for transportation;Help with stairs or ramp for entrance;Supervision due to cognitive status   Can travel by private vehicle        Equipment Recommendations  Wheelchair (measurements PT);Wheelchair cushion (measurements PT);Hospital bed;Hoyer lift    Recommendations for Other Services       Precautions / Restrictions Precautions Precautions: Fall Recall of Precautions/Restrictions: Impaired Restrictions Weight Bearing Restrictions Per Provider Order: No     Mobility  Bed Mobility Overal bed mobility: Needs Assistance Bed Mobility: Supine to Sit, Sit to Supine     Supine to sit: Contact guard Sit to supine: Mod assist   General bed mobility comments: Able  to come EOB with no physical assistance and increased time. Mod A to return to bed due to fatigue.    Transfers Overall transfer level: Needs assistance Equipment used: 2 person hand held assist, Ambulation equipment used Transfers: Sit to/from Stand Sit to Stand: +2 physical assistance, Min assist, From elevated surface, Via lift equipment           General transfer comment: Pt today made signficant progress compared to previous session. Able to perform sit to stands x5 with +2 Min A. First 2 sit to stands were from stedy with final 3 via HHA. Pt able to perform standing marches, weight shifts, and sidesteps while standing today. Transfer via Lift Equipment: Stedy  Ambulation/Gait               General Gait Details: Deferred for safety   Stairs             Wheelchair Mobility     Tilt Bed    Modified Rankin (Stroke Patients Only) Modified Rankin (Stroke Patients Only) Pre-Morbid Rankin Score: No symptoms Modified Rankin: Moderate disability     Balance Overall balance assessment: Needs assistance Sitting-balance support: Bilateral upper extremity supported, Feet supported Sitting balance-Leahy Scale: Fair Sitting balance - Comments: CGA. Mirror provided for visual feedback   Standing balance support: Bilateral upper extremity supported, During functional activity, Reliant on assistive device for balance Standing balance-Leahy Scale: Poor Standing balance comment: Reliant on therapists                            Communication Communication Communication: Impaired Factors Affecting Communication: Difficulty expressing self;Reduced clarity of speech  Cognition Arousal: Alert Behavior During Therapy: Flat affect, WFL for tasks assessed/performed  Following commands: Impaired Following commands impaired: Follows one step commands with increased time    Cueing Cueing Techniques: Verbal cues, Gestural  cues, Tactile cues  Exercises      General Comments General comments (skin integrity, edema, etc.): VSS      Pertinent Vitals/Pain Pain Assessment Pain Assessment: Faces Faces Pain Scale: Hurts a little bit Pain Location: L knee and bottom when seated in recliner Pain Descriptors / Indicators: Sore, Aching, Discomfort Pain Intervention(s): Monitored during session, Limited activity within patient's tolerance, Premedicated before session, Repositioned    Home Living                          Prior Function            PT Goals (current goals can now be found in the care plan section) Progress towards PT goals: Progressing toward goals    Frequency    Min 2X/week      PT Plan      Co-evaluation              AM-PAC PT 6 Clicks Mobility   Outcome Measure  Help needed turning from your back to your side while in a flat bed without using bedrails?: A Lot Help needed moving from lying on your back to sitting on the side of a flat bed without using bedrails?: A Lot Help needed moving to and from a bed to a chair (including a wheelchair)?: A Lot Help needed standing up from a chair using your arms (e.g., wheelchair or bedside chair)?: A Lot Help needed to walk in hospital room?: Total Help needed climbing 3-5 steps with a railing? : Total 6 Click Score: 10    End of Session Equipment Utilized During Treatment: Gait belt Activity Tolerance: Patient tolerated treatment well Patient left: in bed;with call bell/phone within reach;with bed alarm set Nurse Communication: Mobility status;Need for lift equipment PT Visit Diagnosis: Other abnormalities of gait and mobility (R26.89)     Time: 8641-8578 PT Time Calculation (min) (ACUTE ONLY): 23 min  Charges:    $Therapeutic Activity: 23-37 mins PT General Charges $$ ACUTE PT VISIT: 1 Visit                     Terrisa Curfman B, PT, DPT Acute Rehab Services [phone]    Darlene Pugh 07/14/2024, 4:24 PM

## 2024-07-14 NOTE — Progress Notes (Signed)
  Inpatient Rehabilitation Admissions Coordinator   I contacted patient's daughter, Sharene to follow up on our previous conversation on 12/3. We again discussed the need for caregiver supports after a possible [facility] admit vs rehab at SNF until supports can be arranged. She has not spoken with her two sisters yet, but will follow up with me after those discussions.  Heron Leavell, RN, MSN Rehab Admissions Coordinator [phone] 07/14/2024 8:36 AM

## 2024-07-14 NOTE — PMR Pre-admission (Signed)
 PMR Admission Coordinator Pre-Admission Assessment  Patient: Darlene Pugh is an 63 y.o., female MRN: [id number] DOB: 1961/03/28 Height: 5' 4 (162.6 cm) Weight: 76.2 kg  Insurance Information HMO:     PPO:      PCP:      IPA:      80/20:      OTHER:  PRIMARY: [place] Medicaid [employer]      Policy#: [id number]      Subscriber: pt CM Name: ***      Phone#: ***     Fax#: *** Pre-Cert#: [id number]  auth for CIR from *** with *** for admit *** with next review date ***.  Updates due to *** at fax listed above.        Employer:  Benefits:  Phone #: [phone]     Name: 12/9 Eff. Date: 02/08/2024 until 02/06/25     Deduct: none      Out of Pocket Max: none      Life Max: none CIR: 100% per medicaid guidelines      SNF: 100% for 90 days per medicaid Outpatient: 100% 27 visits combined     Co-Pay:  Home Health: 100%      Co-Pay: 100 visits total DME: 100%     Co-Pay: per medical neccesity Providers: in network  SECONDARY: none      Policy#:      Phone#:   Artist:       Phone#:   The Data Processing Manager" for patients in Inpatient Rehabilitation Facilities with attached "Privacy Act Statement-Health Care Records" was provided and verbally reviewed with: Patient and Family  Emergency Contact Information Contact Information     Name Relation Home Work Mobile   [place] Daughter   [phone]   [facility] Daughter   [phone]   Cornesha, Radziewicz   [phone]      Other Contacts     Name Relation Home Work Mobile   Cogbill,[name] Daughter   [phone]      Current Medical History  Patient Admitting Diagnosis: CVA  History of Present Illness: 63 yo female with history of DM, HTN, vit D deficiency, osteoarthritis previous CVA with residual left facial numbness who presented on 07/10/24 after being found down by investment banker, corporate. Presented with facial droop, dysarthria and weakness and multiple falls.   MRI  revealed acute infarct involving the left internal capsule/basal ganglia. 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 2 d echo 55 to 60%, mild LVH. VTE prophylaxis with lovenox . Neurology recommends ASA and clopidogrel  daily for 3 weeks and then plavix  alone.   Home meds lisinopril  and Toprol  XL resumed. Allow permissive hypertension for 24 to 48 hrs and then gradually normalize.Home meds metformin  and farxiga  for DM, Hgb A1c 9.5. CBGS and SSI. Recommend close follow up with PCP for better DM control. Dysphagia with SLP consulted.    Complete NIHSS TOTAL: 4  Patient's medical record from [facility] has been reviewed by the rehabilitation admission coordinator and physician.  Past Medical History  Past Medical History:  Diagnosis Date   Diabetes mellitus without complication (HCC)    Hypertension    Has the patient had major surgery during 100 days prior to admission? No  Family History   family history is not on file.  Current Medications  Current Facility-Administered Medications:    acetaminophen  (TYLENOL ) tablet 500 mg, 500 mg, Oral, Q6H PRN,  Kandis Perkins, DO   amLODipine  (NORVASC ) tablet 5 mg, 5 mg, Oral, Daily, Koomson, Julius, MD, 5 mg at 07/18/24 1140   aspirin  chewable tablet 81 mg, 81 mg, Oral, Daily, Azadegan, Maryam, MD, 81 mg at 07/18/24 1140   atorvastatin  (LIPITOR ) tablet 80 mg, 80 mg, Oral, Daily, King, Olivia, DO, 80 mg at 07/18/24 1140   clopidogrel  (PLAVIX ) tablet 75 mg, 75 mg, Oral, Daily, Chen, Lydia D, RPH, 75 mg at 07/18/24 1140   dapagliflozin  propanediol (FARXIGA ) tablet 10 mg, 10 mg, Oral, Daily, King, Olivia, DO, 10 mg at 07/18/24 1141   diclofenac  Sodium (VOLTAREN ) 1 % topical gel 2 g, 2 g, Topical, QID, Nooruddin, Saad, MD, 2 g at 07/18/24 1143   ezetimibe  (ZETIA ) tablet 10 mg, 10 mg, Oral, Daily, King, Olivia, DO, 10 mg at 07/18/24 1139    famotidine  (PEPCID ) tablet 40 mg, 40 mg, Oral, Daily, King, Olivia, DO, 40 mg at 07/18/24 1140   feeding supplement (GLUCERNA SHAKE) (GLUCERNA SHAKE) liquid 237 mL, 237 mL, Oral, TID BM, King, Olivia, DO, 237 mL at 07/18/24 1142   folic acid  (FOLVITE ) tablet 1 mg, 1 mg, Oral, Daily, King, Olivia, DO, 1 mg at 07/18/24 1140   insulin  aspart (novoLOG ) injection 0-15 Units, 0-15 Units, Subcutaneous, TID WC, Bender, Emily, DO, 2 Units at 07/18/24 1437   insulin  aspart (novoLOG ) injection 0-5 Units, 0-5 Units, Subcutaneous, QHS, Bender, Emily, DO, 4 Units at 07/17/24 2123   insulin  aspart (novoLOG ) injection 3 Units, 3 Units, Subcutaneous, TID WC, King, Olivia, DO, 3 Units at 07/18/24 1439   insulin  glargine (LANTUS ) injection 14 Units, 14 Units, Subcutaneous, Daily, King, Olivia, DO, 14 Units at 07/18/24 1143   ipratropium-albuterol  (DUONEB) 0.5-2.5 (3) MG/3ML nebulizer solution 3 mL, 3 mL, Nebulization, Q4H PRN, Bender, Emily, DO   lidocaine  (LIDODERM ) 5 % 1 patch, 1 patch, Transdermal, Daily, Azadegan, Maryam, MD, 1 patch at 07/18/24 1142   losartan  (COZAAR ) tablet 25 mg, 25 mg, Oral, Daily, King, Olivia, DO, 25 mg at 07/18/24 1140   [START ON 07/19/2024] metFORMIN  (GLUCOPHAGE -XR) 24 hr tablet 500 mg, 500 mg, Oral, Q breakfast, King, Olivia, DO   multivitamin with minerals tablet 1 tablet, 1 tablet, Oral, Daily, Bender, Emily, DO, 1 tablet at 07/18/24 1140   nicotine  (NICODERM CQ  - dosed in mg/24 hours) patch 21 mg, 21 mg, Transdermal, Daily, Bender, Emily, DO, 21 mg at 07/18/24 1144   polyethylene glycol (MIRALAX  / GLYCOLAX ) packet 17 g, 17 g, Oral, Daily, King, Olivia, DO, 17 g at 07/18/24 1407   rivaroxaban  (XARELTO ) tablet 10 mg, 10 mg, Oral, Daily, King, Olivia, DO, 10 mg at 07/18/24 1139   senna-docusate (Senokot-S) tablet 1 tablet, 1 tablet, Oral, BID, King, Olivia, DO, 1 tablet at 07/18/24 1140   thiamine  (VITAMIN B1) tablet 100 mg, 100 mg, Oral, Daily, 100 mg at 07/18/24 1140 **OR** thiamine   (VITAMIN B1) injection 100 mg, 100 mg, Intravenous, Daily, Bender, Emily, DO, 100 mg at 07/11/24 1058  Patients Current Diet:  Diet Order             Diet Carb Modified Fluid consistency: Thin; Room service appropriate? Yes with Assist  Diet effective now                  Precautions / Restrictions Precautions Precautions: Fall Restrictions Weight Bearing Restrictions Per Provider Order: No   Has the patient had 2 or more falls or a fall with injury in the past year? yes  Prior Activity Level Community (5-7x/wk):  indpendent with and without AD, driving, I with adls  Prior Functional Level Self Care: Did the patient need help bathing, dressing, using the toilet or eating? Independent  Indoor Mobility: Did the patient need assistance with walking from room to room (with or without device)? Independent  Stairs: Did the patient need assistance with internal or external stairs (with or without device)? Independent  Functional Cognition: Did the patient need help planning regular tasks such as shopping or remembering to take medications? Independent  Patient Information Are you of Hispanic, Latino/a,or Spanish origin?: A. No, not of Hispanic, Latino/a, or Spanish origin What is your race?: B. Black or African American Do you need or want an interpreter to communicate with a doctor or health care staff?: 0. No  Patient's Response To:  Health Literacy and Transportation Is the patient able to respond to health literacy and transportation needs?: Yes Health Literacy - How often do you need to have someone help you when you read instructions, pamphlets, or other written material from your doctor or pharmacy?: Never In the past 12 months, has lack of transportation kept you from medical appointments or from getting medications?: No In the past 12 months, has lack of transportation kept you from meetings, work, or from getting things needed for daily living?: No  Event Organiser / Equipment Home Equipment: Information systems manager, Standard Walker, Medical Laboratory Scientific Officer - single point  Prior Device Use: Indicate devices/aids used by the patient prior to current illness, exacerbation or injury? None of the above  Current Functional Level Cognition  Arousal/Alertness: Awake/alert Overall Cognitive Status: Impaired/Different from baseline Orientation Level: Oriented X4 Attention: Sustained Sustained Attention: Appears intact Memory: Impaired Memory Impairment:  (0/4 recall, severe impairment) Awareness: Impaired Awareness Impairment: Intellectual impairment, Emergent impairment Problem Solving:  (functional for verbal tasks) Safety/Judgment: Other (comment) (needs supervision initially)    Extremity Assessment (includes Sensation/Coordination)  Upper Extremity Assessment: Left hand dominant, RUE deficits/detail RUE Deficits / Details: increased grip strength and able to use with grooming tasks as gross assist RUE Sensation: decreased proprioception, decreased light touch RUE Coordination: decreased fine motor, decreased gross motor  Lower Extremity Assessment: Defer to PT evaluation    ADLs  Overall ADL's : Needs assistance/impaired Eating/Feeding: Minimal assistance, Sitting Eating/Feeding Details (indicate cue type and reason): assistance with opening containers Grooming: Wash/dry hands, Wash/dry face, Oral care, Supervision/safety, Sitting Grooming Details (indicate cue type and reason): able to use RUE to hold toothbrush when applying toothpaste Upper Body Bathing: Moderate assistance, Sitting Lower Body Bathing: Maximal assistance, Sit to/from stand Lower Body Bathing Details (indicate cue type and reason): stood in [place] for peri area bathing Upper Body Dressing : Moderate assistance, Sitting Lower Body Dressing: Maximal assistance, Sitting/lateral leans, Sit to/from stand Lower Body Dressing Details (indicate cue type and reason): to donn socks Toilet Transfer: Moderate  assistance, +2 for physical assistance, +2 for safety/equipment, Stand-pivot Functional mobility during ADLs: Moderate assistance, +2 for safety/equipment, +2 for physical assistance General ADL Comments: increased RUE use with gross assist with self care    Mobility  Overal bed mobility: Needs Assistance Bed Mobility: Sit to Supine, Supine to Sit Supine to sit: Contact guard, Used rails, HOB elevated Sit to supine: Contact guard assist, Used rails, HOB elevated General bed mobility comments: CGA for safety. Pt used rails    Transfers  Overall transfer level: Needs assistance Equipment used: Rolling walker (2 wheels) Transfers: Sit to/from Stand Sit to Stand: Contact guard assist Bed to/from chair/wheelchair/BSC transfer type:: Via Lift equipment Step  pivot transfers: +2 physical assistance, Mod assist Transfer via Lift Equipment: Stedy General transfer comment: Pt stood on her own today with no physical assistance.    Ambulation / Gait / Stairs / Wheelchair Mobility  Ambulation/Gait Ambulation/Gait assistance: +2 physical assistance, +2 safety/equipment, Min assist, Mod assist, Max assist Gait Distance (Feet): 15 Feet Assistive device: Rolling walker (2 wheels) Gait Pattern/deviations: Knees buckling, Trunk flexed, Wide base of support, Ataxic, Decreased stride length, Step-through pattern General Gait Details: Pt today able to ambulate to door and back with RW. 1 seated rest break. Once at door pt noted with significant knee buckling requiring +2 Mod/Max to correct. Mostly +2 Min A otherwise. Would benefit from close chair follow next session. Gait velocity: decreased    Posture / Balance Dynamic Sitting Balance Sitting balance - Comments: CGA to supervision Balance Overall balance assessment: Needs assistance Sitting-balance support: Bilateral upper extremity supported, Feet supported Sitting balance-Leahy Scale: Fair Sitting balance - Comments: CGA to supervision Postural  control: Right lateral lean Standing balance support: Bilateral upper extremity supported, During functional activity, Reliant on assistive device for balance Standing balance-Leahy Scale: Poor Standing balance comment: Reliant on therapists    Special considerations/life events  Hgb A1c 9.2 Smoker ETOH abuse   Previous Home Environment  Living Arrangements: Alone  Lives With: Alone Available Help at Discharge: Family, Available PRN/intermittently (dtr) Type of Home: Apartment Home Layout: One level Home Access: Stairs to enter Entrance Stairs-Rails: Right, Left, Can reach both Entrance Stairs-Number of Steps: 4 Bathroom Shower/Tub: Engineer, Manufacturing Systems: Standard Bathroom Accessibility: Yes How Accessible: Accessible via walker Home Care Services: No  Discharge Living Setting Plans for Discharge Living Setting: Patient's home Type of Home at Discharge: Apartment Discharge Home Layout: One level Discharge Home Access: Stairs to enter Entrance Stairs-Rails: Can reach both Entrance Stairs-Number of Steps: 5 Discharge Bathroom Shower/Tub: Tub/shower unit Discharge Bathroom Toilet: Standard Discharge Bathroom Accessibility: Yes How Accessible: Accessible via walker Does the patient have any problems obtaining your medications?: No  Social/Family/Support Systems Patient Roles: Parent Contact Information: Sharene, daughter Anticipated Caregiver's Contact Information: [phone] Caregiver Availability: 24/7 Discharge Plan Discussed with Primary Caregiver: Yes Is Caregiver In Agreement with Plan?: Yes Does Caregiver/Family have Issues with Lodging/Transportation while Pt is in Rehab?: No  Goals Patient/Family Goal for Rehab: min assist with PT and OT, possibly w/c level, supervision with SLP Expected length of stay: ELOS 2 to 3 weeks Pt/Family Agrees to Admission and willing to participate: Yes Program Orientation Provided & Reviewed with Pt/Caregiver Including  Roles  & Responsibilities: Yes  Decrease burden of Care through IP rehab admission: n/a  Possible need for SNF placement upon discharge: not anticipated  Patient Condition: I have reviewed medical records from [facility], spoken with CM, and daughter and family member. I met with patient at the bedside and discussed via phone for inpatient rehabilitation assessment.  Patient will benefit from ongoing PT, OT, and SLP, can actively participate in 3 hours of therapy a day 5 days of the week, and can make measurable gains during the admission.  Patient will also benefit from the coordinated team approach during an Inpatient Acute Rehabilitation admission.  The patient will receive intensive therapy as well as Rehabilitation physician, nursing, social worker, and care management interventions.  Due to bladder management, bowel management, safety, skin/wound care, disease management, medication administration, pain management, and patient education the patient requires 24 hour a day rehabilitation nursing.  The patient is currently *** with mobility and basic ADLs.  Discharge setting and therapy post discharge at home with home health is anticipated.  Patient has agreed to participate in the Acute Inpatient Rehabilitation Program and will admit {Time; today/tomorrow:10263}.  Preadmission Screen Completed By:  Alison Heron Lot, RN MSN 07/18/2024 3:02 PM ______________________________________________________________________   Discussed status with Dr. PIERRETTE on *** at *** and received approval for admission today.  Admission Coordinator:  Alison Heron Lot, RN MSN time PIERRETTEPattricia ***   Assessment/Plan: Diagnosis: *** Does the need for close, 24 hr/day Medical supervision in concert with the patient's rehab needs make it unreasonable for this patient to be served in a less intensive setting? {yes_no_potentially:3041433} Co-Morbidities requiring supervision/potential complications: *** Due to  {due un:6958565}, does the patient require 24 hr/day rehab nursing? {yes_no_potentially:3041433} Does the patient require coordinated care of a physician, rehab nurse, PT, OT, and SLP to address physical and functional deficits in the context of the above medical diagnosis(es)? {yes_no_potentially:3041433} Addressing deficits in the following areas: {deficits:3041436} Can the patient actively participate in an intensive therapy program of at least 3 hrs of therapy 5 days a week? {yes_no_potentially:3041433} The potential for patient to make measurable gains while on inpatient rehab is {potential:3041437} Anticipated functional outcomes upon discharge from inpatient rehab: {functional outcomes:304600100} PT, {functional outcomes:304600100} OT, {functional outcomes:304600100} SLP Estimated rehab length of stay to reach the above functional goals is: *** Anticipated discharge destination: {anticipated dc setting:21604} 10. Overall Rehab/Functional Prognosis: {potential:3041437}   MD Signature: ***

## 2024-07-14 NOTE — Care Management (Addendum)
 Transition of Care [facility]) - Inpatient Brief Assessment   Patient Details  Name: Darlene Pugh MRN: [id number] Date of Birth: 18-Aug-1960  [facility] CM/SW Contact:    Corean JAYSON Canary, RN Phone Number: 07/14/2024, 8:51 AM   Clinical Narrative:  Patient presented with weakness CVA. PT has assessed and recommended CIR. B[facility] of CIR is awaiting family notification that they can offer 24/7 assist post CIR prior to accepting.  1330 Patient is medically ready, CIR has reached out to family today.  IPCM will follow  Transition of Care Asessment: Insurance and Status: Insurance coverage has been reviewed Patient has primary care physician: Yes Home environment has been reviewed: By self in apartment Prior level of function:: Independent Prior/Current Home Services: No current home services Social Drivers of Health Review: SDOH reviewed needs interventions Readmission risk has been reviewed: Yes Transition of care needs: transition of care needs identified, TOC will continue to follow

## 2024-07-14 NOTE — Plan of Care (Signed)

## 2024-07-14 NOTE — Telephone Encounter (Signed)
 Pharmacy Patient Advocate Encounter  Insurance verification completed.    The patient is insured through [facility] MEDICAID.     Ran test claim for Xarelto  10mg  tablet and the current 30 day co-pay is $4.   This test claim was processed through [employer]- copay amounts may vary at other pharmacies due to [employer], or as the patient moves through the different stages of their insurance plan.

## 2024-07-14 NOTE — Progress Notes (Addendum)
 HD#4 SUBJECTIVE:  Patient Summary: Darlene Pugh is a 63 y.o. person living with a history of type 2 diabetes, hypertension, osteoarthritis, vitamin D deficiency who presented with right-sided weakness after being found down at home and admitted for CVA on hospital day 4.  Overnight Events:No events overnight  Interim History: The patient was seen and examined at the bedside this morning.  She was feeling much better overall and said that her knee pain was much improved.  She is still feeling drowsy however we did wake her from sleep. She is much more verbal with her wishes and desires as far as care goes this morning. All questions and concerns were addressed at this time.  OBJECTIVE:  Vital Signs: Vitals:   07/13/24 2352 07/14/24 0340 07/14/24 0842 07/14/24 1133  BP: (!) 107/92 108/68 110/73 100/68  Pulse: 81 80 79 81  Resp: 18 16 18 18   Temp: 99 F (37.2 C) 97.6 F (36.4 C) 97.8 F (36.6 C) 97.8 F (36.6 C)  TempSrc: Oral  Oral Oral  SpO2: 97% 94% 92% 98%  Weight:      Height:       Supplemental O2: Room Air SpO2: 98 % O2 Flow Rate (L/min): 2 L/min  Filed Weights   07/10/24 1000 07/10/24 1047  Weight: 76.2 kg 76.2 kg     Intake/Output Summary (Last 24 hours) at 07/14/2024 1329 Last data filed at 07/14/2024 0700 Gross per 24 hour  Intake --  Output 250 ml  Net -250 ml   Net IO Since Admission: -672.11 mL [07/14/24 1329]  Physical Exam: Const: Awake, alert in NAD HENT: Normocephalic, atraumatic, mucus membranes moist Card: RRR, No MRG, No pitting edema on LE's bilaterally  Resp: No increased work of breathing. Cough seemingly improved from days past as well as decreased rales noted bilaterally.  Abd: Soft, NTND, Bsx4 Extremities: Warm. Left knee with ace bandage in place. Edema and effusion decreased from yesterday and decreased tenderness to palpation. R 5th digit hyperextended from previous fall and fracture (remote).  Neuro: Right facial droop  noted, unchanged.  Weakness noted in RUE and RLE extremity. Hip flexion 3/5 today, improved from yesterday. Heel lifted off bed without assistance.   Patient Lines/Drains/Airways Status     Active Line/Drains/Airways     Name Placement date Placement time Site Days   Peripheral IV 07/13/24 22 G 1 Posterior;Right Forearm 07/13/24  0020  Forearm  1   External Urinary Catheter 07/13/24  0726  --  1            Pertinent labs and imaging:      Latest Ref Rng & Units 07/14/2024    5:28 AM 07/12/2024    1:50 AM 07/11/2024    2:37 AM  CBC  WBC 4.0 - 10.5 K/uL 7.7  8.3  8.1   Hemoglobin 12.0 - 15.0 g/dL 86.5  85.4  84.7   Hematocrit 36.0 - 46.0 % 39.3  43.9  45.5   Platelets 150 - 400 K/uL 256  261  292        Latest Ref Rng & Units 07/14/2024    5:28 AM 07/13/2024    7:22 AM 07/12/2024    1:50 AM  CMP  Glucose 70 - 99 mg/dL 868  849  864   BUN 8 - 23 mg/dL 10  6  10    Creatinine 0.44 - 1.00 mg/dL 9.18  9.47  9.05   Sodium 135 - 145 mmol/L 139  137  143  Potassium 3.5 - 5.1 mmol/L 3.4  3.8  3.7   Chloride 98 - 111 mmol/L 97  102  102   CO2 22 - 32 mmol/L 29  24  30    Calcium  8.9 - 10.3 mg/dL 8.8  8.2  9.0   Total Protein 6.5 - 8.1 g/dL 6.1   6.1   Total Bilirubin 0.0 - 1.2 mg/dL 2.6   3.0   Alkaline Phos 38 - 126 U/L 74   73   AST 15 - 41 U/L 12   15   ALT 0 - 44 U/L 13   13     ASSESSMENT/PLAN:  Assessment: Principal Problem:   Acute CVA (cerebrovascular accident) (HCC) Active Problems:   Hypokalemia   T2DM (type 2 diabetes mellitus) (HCC)   HTN (hypertension)   Tobacco use disorder   Alcohol use disorder   Hyperlipidemia   Hemiparesis of right dominant side due to acute cerebrovascular disease (HCC)   Dysphagia due to recent cerebrovascular accident (CVA)   Adjustment reaction with anxiety and depression   Dysarthria due to acute cerebrovascular accident (CVA) (HCC)   Cough, unspecified; aspiration (related to CVA) suspected, no pneumonia   Effusion of left  knee joint after fall   Left knee pain   Plan: #Acute left internal capsule/basal ganglia CVA #Right Hemiparesis #Dysphagia #Dysarthria -The patient was admitted on 12/1 with MRI findings of a left intracranial capsule/basal ganglia CVA. -Neurology signed off yesterday.  -TTE without atrial shunt -Continue aspirin  and Plavix --Plavix  for 3 weeks followed by aspirin  alone  -PT/OT recommended CIR, referral placed and conversation with family about discharge planning underway.  -SLP following and continue to recommend Dysphagia 2 with nectar thick liquids--She has increased food intake, however maintains low oral drink intake. Dry on exam with dark urine, will give 1L LR today.   #Left knee pain -Improved from yesterday after ace wraps and ice packs with toradol  therapy. -If symptoms continue to worsen, will consider x-ray.  #Hypertension -BP improved today with 100's/60's. Suspect some of this is due to decreased volume status.  -Continue losartan  25mg  daily and amlodipine  10mg  daily.   #Urinary retention, resolved (behavioral) -The patient is continue to want to hold her urine, leading to increased bladder scans, however she will void with prompting.  -Monitor closely and continue prompted voiding.  -Urine dark today--suspect dehydration, however will obtain UA due to mild retention.  #Type 2 diabetes, uncontrolled Last A1c 9.2 Semglee  7 units added.  Continue sliding scale insulin .  # Nicotine  dependence -The patient has significant History of cigarello use. -Continue nicotine  patch high dose  #Alcohol use disorder -The patient was to drink several glasses of liquor a day with additional beer occasionally. - No signs of withdrawal this hospitalization. - Bilirubin incidentally elevated however LFTs normal on last CMP.  Bili improved today  #Depression -Will continue to monitor for depressive symptoms closely and continue conversations about benefits of SSRI initiation,  however will hold off on adding medication as the patient is hesitant.   #Disposition Ms. Arizmendi will need either CIR or SNF level therapies.  We appreciate TOC conversations behind the seens.  Ms. Gras is very frustrated as anyone would be with her new functional deficits.  -Discussed with social work today who reported that if the family cannot ensure 24/7 care after CIR, she will be denied and need to look into SNF options. Discussed this with her daughter who will make a decision by the morning.   Best Practice: Diet: Dysphagia  2 with nectar thick liquids IVF: Fluids: LR, Rate: 1000 cc bolus VTE: enoxaparin  (LOVENOX ) injection 40 mg Start: 07/10/24 1445 Code: Full  Disposition planning: Therapy Recs: Pending,  DISPO: Pending clinical improvement  Signature:  Schuyler Novak, DO [facility]  1:29 PM, 07/14/2024  On Call pager [phone]

## 2024-07-14 NOTE — Progress Notes (Signed)
 Initial Nutrition Assessment  DOCUMENTATION CODES:  Not applicable  INTERVENTION:  Continue current diet as ordered per SLP Encourage PO intake Mighty Shake TID with meals, each supplement provides 330 kcals and 9 grams of protein MVI with minerals daily  NUTRITION DIAGNOSIS:  Swallowing difficulty related to acute illness (CVA) as evidenced by  (need for modifed texture and liquid thickness).  GOAL:  Patient will meet greater than or equal to 90% of their needs  MONITOR:  PO intake, Supplement acceptance, Labs, Diet advancement  REASON FOR ASSESSMENT:  Consult Assessment of nutrition requirement/status, Poor PO  ASSESSMENT:  Pt with hx of DM type 2, HTN, osteoarthritis, and prior CVA presented to ED after several falls at home and weakness. Workup in ED showed acute CVA.  12/1 - presented to ED as code stroke 12/2 - MBS, DYS2, nectar thick  Pt resting in bed at the time of assessment, family at bedside. Pt awake and conversant, able to participate in nutrition hx. Still having right sided weakness which is also affecting her speech.   Pt reports appetite has been ok, doing fine on the thickened liquids as well. Noted pt being worked up for [employer]. Pt reports decreased appetite since around Thanksgiving (few days PTA).  Agreeable to receiving mighty shakes on meal trays, prefers vanilla.   Overall appears well nourished on exam. Will monitor PO intake for the need for further nutrition interventions.   Admit / Current weight: 76.2 kg    Average Meal Intake: 12/3: 50% intake x 2 recorded meals  Nutritionally Relevant Medications: Scheduled Meds:  atorvastatin   80 mg Oral q1800   ezetimibe   10 mg Oral Daily   folic acid   1 mg Oral Daily   insulin  aspart  0-15 Units Subcutaneous TID WC   insulin  aspart  0-5 Units Subcutaneous QHS   insulin  glargine  7 Units Subcutaneous QHS   multivitamin with minerals  1 tablet Oral Daily   pantoprazole   40 mg Oral Daily   thiamine   100  mg Oral Daily   Labs Reviewed: Potassium 3.4 Chloride 97 CBG ranges from 145-253 mg/dL over the last 24 hours HgbA1c 9.5% (12/1)   NUTRITION - FOCUSED PHYSICAL EXAM: Flowsheet Row Most Recent Value  Orbital Region No depletion  Upper Arm Region No depletion  Thoracic and Lumbar Region No depletion  Buccal Region No depletion  Temple Region No depletion  Clavicle Bone Region No depletion  Clavicle and Acromion Bone Region No depletion  Scapular Bone Region No depletion  Dorsal Hand No depletion  Patellar Region No depletion  Anterior Thigh Region No depletion  Posterior Calf Region No depletion  Edema (RD Assessment) None  Hair Reviewed  Eyes Reviewed  Mouth Reviewed  Skin Reviewed  Nails Reviewed    Diet Order:   Diet Order             DIET DYS 2 Room service appropriate? Yes with Assist; Fluid consistency: Nectar Thick  Diet effective now                   EDUCATION NEEDS:  Education needs have been addressed  Skin:  Skin Assessment: Reviewed RN Assessment  Last BM:  12/4 - type 4  Height:  Ht Readings from Last 1 Encounters:  07/10/24 5' 4 (1.626 m)    Weight:  Wt Readings from Last 1 Encounters:  07/10/24 76.2 kg    Ideal Body Weight:  54.5 kg  BMI:  Body mass index is 28.84 kg/m.  Estimated Nutritional Needs:  Kcal:  1600-1800 kcal/d Protein:  75-90g/d Fluid:  >/=1.8L/d    Vernell Lukes, RD, LDN, CNSC Registered Dietitian II Please reach out via secure chat

## 2024-07-15 ENCOUNTER — Inpatient Hospital Stay (HOSPITAL_COMMUNITY)

## 2024-07-15 LAB — GLUCOSE, CAPILLARY
Glucose-Capillary: 188 mg/dL — ABNORMAL HIGH (ref 70–99)
Glucose-Capillary: 284 mg/dL — ABNORMAL HIGH (ref 70–99)
Glucose-Capillary: 199 mg/dL — ABNORMAL HIGH (ref 70–99)
Glucose-Capillary: 247 mg/dL — ABNORMAL HIGH (ref 70–99)

## 2024-07-15 MED ORDER — INSULIN GLARGINE 100 UNIT/ML ~~LOC~~ SOLN
10.0000 [IU] | Freq: Every day | SUBCUTANEOUS | Status: DC
  Administered 2024-07-15: 10 [IU] via SUBCUTANEOUS
  Filled 2024-07-15 (×2): qty 0.1

## 2024-07-15 MED ORDER — SODIUM CHLORIDE 0.9 % IV SOLN
1.0000 g | INTRAVENOUS | Status: AC
  Administered 2024-07-15 – 2024-07-17 (×3): 1 g via INTRAVENOUS
  Filled 2024-07-15 (×3): qty 10

## 2024-07-15 MED ORDER — AMLODIPINE BESYLATE 5 MG PO TABS
5.0000 mg | ORAL_TABLET | Freq: Every day | ORAL | Status: DC
  Administered 2024-07-15 – 2024-07-19 (×5): 5 mg via ORAL
  Filled 2024-07-15 (×5): qty 1

## 2024-07-15 MED ORDER — LACTATED RINGERS IV BOLUS
1000.0000 mL | Freq: Once | INTRAVENOUS | Status: AC
  Administered 2024-07-15: 1000 mL via INTRAVENOUS

## 2024-07-15 NOTE — Plan of Care (Signed)
  Problem: Nutritional: Goal: Maintenance of adequate nutrition will improve Outcome: Progressing Goal: Progress toward achieving an optimal weight will improve Outcome: Progressing   Problem: Clinical Measurements: Goal: Ability to maintain clinical measurements within normal limits will improve Outcome: Progressing Goal: Will remain free from infection Outcome: Progressing Goal: Diagnostic test results will improve Outcome: Progressing Goal: Respiratory complications will improve Outcome: Progressing Goal: Cardiovascular complication will be avoided Outcome: Progressing   Problem: Activity: Goal: Risk for activity intolerance will decrease Outcome: Progressing   Problem: Nutrition: Goal: Adequate nutrition will be maintained Outcome: Progressing   Problem: Pain Managment: Goal: General experience of comfort will improve and/or be controlled Outcome: Progressing   Problem: Safety: Goal: Ability to remain free from injury will improve Outcome: Progressing   Problem: Skin Integrity: Goal: Risk for impaired skin integrity will decrease Outcome: Progressing

## 2024-07-15 NOTE — Plan of Care (Signed)

## 2024-07-15 NOTE — Progress Notes (Signed)
 HD#4 SUBJECTIVE:  Patient Summary: Darlene Pugh is a 63 y.o. person living with a history of type 2 diabetes, hypertension, osteoarthritis, vitamin D deficiency who presented with right-sided weakness after being found down at home and admitted for CVA on hospital day 4.  Overnight Events:No acute events overnight  Interim History: The patient was seen and examined at the bedside this morning.  She was awoken from sleep was resting comfortably.  She denies any complaints at this time and reports that she is feeling better overall.  OBJECTIVE:  Vital Signs: Vitals:   07/14/24 1600 07/14/24 1953 07/14/24 2348 07/15/24 0342  BP: 98/78 118/73 114/81 120/85  Pulse: 84 93 96 98  Resp:  19 17 17   Temp:  98.3 F (36.8 C) 97.9 F (36.6 C) 97.7 F (36.5 C)  TempSrc:  Oral Oral Oral  SpO2:  99% 95% 96%  Weight:      Height:       Supplemental O2: Room Air SpO2: 96 % O2 Flow Rate (L/min): 2 L/min  Filed Weights   07/10/24 1000 07/10/24 1047  Weight: 76.2 kg 76.2 kg     Intake/Output Summary (Last 24 hours) at 07/15/2024 1026 Last data filed at 07/15/2024 0346 Gross per 24 hour  Intake 987.94 ml  Output 900 ml  Net 87.94 ml   Net IO Since Admission: -584.17 mL [07/15/24 1026]  Physical Exam: Const: Awake, alert in NAD HENT: Normocephalic, atraumatic, mucus membranes moist Card: RRR, No MRG, No pitting edema on LE's bilaterally  Resp: No increased work of breathing. Cough improved from days past as well as decreased rales noted bilaterally.  Abd: Soft, NTND, Bsx4 Extremities: Warm.  Left knee pain continues to improve. Neuro: Right facial droop noted, unchanged.  Weakness noted in RUE and RLE extremity.   Patient Lines/Drains/Airways Status     Active Line/Drains/Airways     Name Placement date Placement time Site Days   Peripheral IV 07/13/24 22 G 1 Posterior;Right Forearm 07/13/24  0020  Forearm  2   External Urinary Catheter 07/13/24  0726  --  2             Pertinent labs and imaging:      Latest Ref Rng & Units 07/14/2024    5:28 AM 07/12/2024    1:50 AM 07/11/2024    2:37 AM  CBC  WBC 4.0 - 10.5 K/uL 7.7  8.3  8.1   Hemoglobin 12.0 - 15.0 g/dL 86.5  85.4  84.7   Hematocrit 36.0 - 46.0 % 39.3  43.9  45.5   Platelets 150 - 400 K/uL 256  261  292        Latest Ref Rng & Units 07/14/2024    5:28 AM 07/13/2024    7:22 AM 07/12/2024    1:50 AM  CMP  Glucose 70 - 99 mg/dL 868  849  864   BUN 8 - 23 mg/dL 10  6  10    Creatinine 0.44 - 1.00 mg/dL 9.18  9.47  9.05   Sodium 135 - 145 mmol/L 139  137  143   Potassium 3.5 - 5.1 mmol/L 3.4  3.8  3.7   Chloride 98 - 111 mmol/L 97  102  102   CO2 22 - 32 mmol/L 29  24  30    Calcium  8.9 - 10.3 mg/dL 8.8  8.2  9.0   Total Protein 6.5 - 8.1 g/dL 6.1   6.1   Total Bilirubin 0.0 - 1.2 mg/dL  2.6   3.0   Alkaline Phos 38 - 126 U/L 74   73   AST 15 - 41 U/L 12   15   ALT 0 - 44 U/L 13   13     ASSESSMENT/PLAN:  Assessment: Principal Problem:   Acute CVA (cerebrovascular accident) (HCC) Active Problems:   Hypokalemia   T2DM (type 2 diabetes mellitus) (HCC)   HTN (hypertension)   Tobacco use disorder   Alcohol use disorder   Hyperlipidemia   Hemiparesis of right dominant side due to acute cerebrovascular disease (HCC)   Dysphagia due to recent cerebrovascular accident (CVA)   Adjustment reaction with anxiety and depression   Dysarthria due to acute cerebrovascular accident (CVA) (HCC)   Cough, unspecified; aspiration (related to CVA) suspected, no pneumonia   Effusion of left knee joint after fall   Left knee pain   Plan: #Acute left internal capsule/basal ganglia CVA #Right Hemiparesis #Dysphagia #Dysarthria -The patient was admitted on 12/1 with MRI findings of a left intracranial capsule/basal ganglia CVA. -Neurology has signed off. -TTE without atrial shunt -Continue aspirin  and Plavix --Plavix  for 3 weeks followed by aspirin  alone  -PT/OT recommended CIR, referral placed  and conversation with family about discharge planning underway.  -SLP following and continue to recommend Dysphagia 2 with nectar thick liquids--She has increased food intake, however maintains low oral fluid intake.  Continue daily fluids to supplement.  #Left knee pain - Greatly improved with conservative management.  #Hypertension - Normotensive today. Continue losartan  and amlodipine .  #UTI #Urinary retention, resolved (behavioral) - The patient is voiding appropriately with prompting. -Monitor closely and continue prompted voiding.  - UA obtained yesterday and positive for leukocytes and many bacteria with white blood cells. -Will initiate ceftriaxone  x 3 days.  #Type 2 diabetes, uncontrolled Last A1c 9.2 Semglee  7 units added.  Continue sliding scale insulin .  # Nicotine  dependence -The patient has significant History of cigarello use. -Continue nicotine  patch high dose  #Alcohol use disorder -The patient was to drink several glasses of liquor a day with additional beer occasionally. - No signs of withdrawal this hospitalization. - Bilirubin incidentally elevated however LFTs normal on last CMP.  Bili improved today  #Depression -Will continue to monitor for depressive symptoms closely and continue conversations about benefits of SSRI initiation, however will hold off on adding medication as the patient is hesitant.   #Disposition Ms. He will need either CIR or SNF level therapies.  We appreciate TOC conversations behind the scenes.  Ms. Brzoska is very frustrated as anyone would be with her new functional deficits.  - Continue discussions with family members as to the appropriate next steps  Best Practice: Diet: Dysphagia 2 with nectar thick liquids IVF: Fluids: LR, Rate: 1000 cc bolus VTE: rivaroxaban  (XARELTO ) tablet 10 mg Start: 07/14/24 1700 Code: Full  Disposition planning: Therapy Recs: Pending,  DISPO: Pending clinical improvement  Signature:  Schuyler Novak, DO [facility]  10:26 AM, 07/15/2024  On Call pager [phone]

## 2024-07-16 LAB — GLUCOSE, CAPILLARY
Glucose-Capillary: 174 mg/dL — ABNORMAL HIGH (ref 70–99)
Glucose-Capillary: 225 mg/dL — ABNORMAL HIGH (ref 70–99)
Glucose-Capillary: 275 mg/dL — ABNORMAL HIGH (ref 70–99)
Glucose-Capillary: 278 mg/dL — ABNORMAL HIGH (ref 70–99)

## 2024-07-16 LAB — URINE CULTURE: Culture: >=100000 — AB

## 2024-07-16 MED ORDER — INSULIN GLARGINE 100 UNIT/ML ~~LOC~~ SOLN
14.0000 [IU] | Freq: Every day | SUBCUTANEOUS | Status: DC
  Administered 2024-07-16: 14 [IU] via SUBCUTANEOUS
  Filled 2024-07-16 (×2): qty 0.14

## 2024-07-16 NOTE — Plan of Care (Signed)
   Problem: Education: Goal: Ability to describe self-care measures that may prevent or decrease complications (Diabetes Survival Skills Education) will improve Outcome: Progressing Goal: Individualized Educational Video(s) Outcome: Progressing   Problem: Fluid Volume: Goal: Ability to maintain a balanced intake and output will improve Outcome: Progressing

## 2024-07-16 NOTE — Plan of Care (Signed)
  Problem: Nutritional: Goal: Maintenance of adequate nutrition will improve Outcome: Progressing Goal: Progress toward achieving an optimal weight will improve Outcome: Progressing   Problem: Skin Integrity: Goal: Risk for impaired skin integrity will decrease Outcome: Progressing   Problem: Clinical Measurements: Goal: Ability to maintain clinical measurements within normal limits will improve Outcome: Progressing Goal: Will remain free from infection Outcome: Progressing Goal: Diagnostic test results will improve Outcome: Progressing Goal: Respiratory complications will improve Outcome: Progressing Goal: Cardiovascular complication will be avoided Outcome: Progressing   Problem: Activity: Goal: Risk for activity intolerance will decrease Outcome: Progressing   Problem: Elimination: Goal: Will not experience complications related to bowel motility Outcome: Progressing Goal: Will not experience complications related to urinary retention Outcome: Progressing   Problem: Pain Managment: Goal: General experience of comfort will improve and/or be controlled Outcome: Progressing   Problem: Safety: Goal: Ability to remain free from injury will improve Outcome: Progressing

## 2024-07-16 NOTE — TOC Progression Note (Signed)
 Transition of Care [facility]) - Progression Note    Patient Details  Name: Darlene Pugh MRN: [id number] Date of Birth: 09/16/1960  [facility]) CM/SW Contact  Gwenn Frieze [place], [place] Phone Number: 07/16/2024, 8:53 AM  Clinical Narrative: Per MD, pt and pt's family agreeable to SNF. Will begin search and f/u with offers as available.   Frieze Gwenn, MSW, LCSW [phone] (coverage)      Expected Discharge Plan: IP Rehab Facility                 Expected Discharge Plan and Services                                               Social Drivers of Health (SDOH) Interventions SDOH Screenings   Food Insecurity: No Food Insecurity (07/10/2024)  Housing: Low Risk  (07/10/2024)  Transportation Needs: Unmet Transportation Needs (07/10/2024)  Utilities: Not At Risk (07/10/2024)  Financial Resource Strain: Not on File (11/27/2021)   Received from [facility]  Physical Activity: Not on File (11/27/2021)   Received from [facility]  Social Connections: Not on File (05/03/2023)   Received from [facility]  Stress: Not on File (11/27/2021)   Received from [facility]  Tobacco Use: High Risk (07/11/2024)    Readmission Risk Interventions     No data to display

## 2024-07-16 NOTE — Progress Notes (Signed)
 HD#4 SUBJECTIVE:  Patient Summary: Darlene Pugh is a 63 y.o. person living with a history of type 2 diabetes, hypertension, osteoarthritis, vitamin D deficiency who presented with right-sided weakness after being found down at home and admitted for CVA on hospital day 4.  Overnight Events:No acute events overnight  Interim History: The patient was seen and examined at the bedside this morning.  She was ready to eat breakfast.  Spoke with patient and daughters yesterday about the benefits of skilled nursing facility poststroke,both are agreeable. Will get social worker to assist with insurance authorization, and SNF bed offers.  OBJECTIVE:  Vital Signs: Vitals:   07/15/24 1944 07/16/24 0016 07/16/24 0344 07/16/24 0747  BP: 139/70 137/83 (!) 144/97 (!) 150/89  Pulse: 98 89 88 83  Resp: 17 17 16 16   Temp: 98.1 F (36.7 C) 98.7 F (37.1 C) (!) 97.4 F (36.3 C) 98.2 F (36.8 C)  TempSrc: Oral Oral Oral Oral  SpO2: 93% 96% 95% 91%  Weight:      Height:       Supplemental O2: Room Air SpO2: 91 % O2 Flow Rate (L/min): 2 L/min  Filed Weights   07/10/24 1000 07/10/24 1047  Weight: 76.2 kg 76.2 kg     Intake/Output Summary (Last 24 hours) at 07/16/2024 0926 Last data filed at 07/16/2024 0339 Gross per 24 hour  Intake 1375.94 ml  Output 2550 ml  Net -1174.06 ml   Net IO Since Admission: -1,758.23 mL [07/16/24 0926]  Physical Exam: Const: Awake, alert in NAD HENT: Normocephalic, atraumatic, mucus membranes moist Card: RRR, No MRG, No pitting edema on LE's bilaterally  Resp: No increased work of breathing. Cough improved from days past as well as decreased rales noted bilaterally.  Abd: Soft, NTND, Bsx4 Extremities: Warm.  Left knee pain continues to improve. Neuro: Right facial droop noted, unchanged.  Weakness noted in RUE and RLE extremity.   Patient Lines/Drains/Airways Status     Active Line/Drains/Airways     Name Placement date Placement time Site Days    Peripheral IV 07/13/24 22 G 1 Posterior;Right Forearm 07/13/24  0020  Forearm  3   External Urinary Catheter 07/13/24  0726  --  3            Pertinent labs and imaging:      Latest Ref Rng & Units 07/14/2024    5:28 AM 07/12/2024    1:50 AM 07/11/2024    2:37 AM  CBC  WBC 4.0 - 10.5 K/uL 7.7  8.3  8.1   Hemoglobin 12.0 - 15.0 g/dL 86.5  85.4  84.7   Hematocrit 36.0 - 46.0 % 39.3  43.9  45.5   Platelets 150 - 400 K/uL 256  261  292        Latest Ref Rng & Units 07/14/2024    5:28 AM 07/13/2024    7:22 AM 07/12/2024    1:50 AM  CMP  Glucose 70 - 99 mg/dL 868  849  864   BUN 8 - 23 mg/dL 10  6  10    Creatinine 0.44 - 1.00 mg/dL 9.18  9.47  9.05   Sodium 135 - 145 mmol/L 139  137  143   Potassium 3.5 - 5.1 mmol/L 3.4  3.8  3.7   Chloride 98 - 111 mmol/L 97  102  102   CO2 22 - 32 mmol/L 29  24  30    Calcium  8.9 - 10.3 mg/dL 8.8  8.2  9.0  Total Protein 6.5 - 8.1 g/dL 6.1   6.1   Total Bilirubin 0.0 - 1.2 mg/dL 2.6   3.0   Alkaline Phos 38 - 126 U/L 74   73   AST 15 - 41 U/L 12   15   ALT 0 - 44 U/L 13   13     ASSESSMENT/PLAN:  Assessment: Principal Problem:   Acute CVA (cerebrovascular accident) (HCC) Active Problems:   Hypokalemia   T2DM (type 2 diabetes mellitus) (HCC)   HTN (hypertension)   Tobacco use disorder   Alcohol use disorder   Hyperlipidemia   Hemiparesis of right dominant side due to acute cerebrovascular disease (HCC)   Dysphagia due to recent cerebrovascular accident (CVA)   Adjustment reaction with anxiety and depression   Dysarthria due to acute cerebrovascular accident (CVA) (HCC)   Cough, unspecified; aspiration (related to CVA) suspected, no pneumonia   Effusion of left knee joint after fall   Left knee pain   Plan: #Acute left internal capsule/basal ganglia CVA #Right Hemiparesis #Dysphagia #Dysarthria -The patient was admitted on 12/1 with MRI findings of a left intracranial capsule/basal ganglia CVA. -Neurology has signed  off. -TTE without atrial shunt -Continue aspirin  and Plavix --Plavix  for 3 weeks followed by aspirin  alone  -PT/OT recommended CIR, however this option was deferred as family cannot offer 24/7 assist with CIR.  Social work assisting with [employer] authorization and bed offers. -SLP following and continue to recommend Dysphagia 2 with nectar thick liquids--She has increased food intake, however maintains low oral fluid intake.  Continue daily PO to supplement.  #Left knee pain - Greatly improved with conservative management.  #Hypertension - Normotensive today. Continue losartan  and amlodipine .  #UTI #Urinary retention, resolved (behavioral) - The patient is voiding appropriately with prompting. - Monitor closely and continue prompted voiding.  - UA 12/5 positive for leukocytes and many bacteria with white blood cells, started on ceftriaxone ,  today is #2/3.  #Type 2 diabetes, uncontrolled Last A1c 9.2 -Prandial and fasting BG elevated. -Continue SSI, adjusted Semglee  to 14 units  # Nicotine  dependence -The patient has significant History of cigarello use. -Continue nicotine  patch high dose  #Alcohol use disorder -The patient was to drink several glasses of liquor a day with additional beer occasionally. - No signs of withdrawal this hospitalization. - Bilirubin incidentally elevated however LFTs normal on last CMP, follow-up CMP yesterday was normal.  #Depression -Will continue to monitor for depressive symptoms closely and continue conversations about benefits of SSRI initiation, however will hold off on adding medication as the patient is hesitant.   #Disposition -Pending SNF.  Best Practice: Diet: Dysphagia 2 with nectar thick liquids IVF: Fluids: LR, Rate: 1000 cc bolus VTE: rivaroxaban  (XARELTO ) tablet 10 mg Start: 07/14/24 1700 Code: Full  Disposition planning: Therapy Recs: SNF  DISPO: Pending clinical improvement  Signature: Missy Sandhoff, M.D.   Internal Medicine Resident, PGY-2  9:31 AM, 07/16/2024

## 2024-07-16 NOTE — NC FL2 (Signed)
 [place]  MEDICAID [id number] LEVEL OF CARE FORM     IDENTIFICATION  Patient Name: Darlene Pugh Birthdate: 03-Oct-1960 Sex: female Admission Date (Current Location): 07/10/2024  [facility] and [place] Number:  Producer, Television/film/video and Address:  [facility], [address] [place], [place] [address]      Provider Number: [id number]  Attending Physician Name and Address:  Rosan Dayton BROCKS, DO  Relative Name and Phone Number:       Current Level of Care: Hospital Recommended Level of Care: Skilled Nursing Facility Prior Approval Number:    Date Approved/Denied:   PASRR Number: [id number]  Discharge Plan: SNF    Current Diagnoses: Patient Active Problem List   Diagnosis Date Noted   Effusion of left knee joint after fall 07/13/2024   Left knee pain 07/13/2024   Hemiparesis of right dominant side due to acute cerebrovascular disease (HCC) 07/12/2024   Dysphagia due to recent cerebrovascular accident (CVA) 07/12/2024   Adjustment reaction with anxiety and depression 07/12/2024   Dysarthria due to acute cerebrovascular accident (CVA) (HCC) 07/12/2024   Cough, unspecified; aspiration (related to CVA) suspected, no pneumonia 07/12/2024   Acute CVA (cerebrovascular accident) (HCC) 07/10/2024   Hypokalemia 07/10/2024   T2DM (type 2 diabetes mellitus) (HCC) 07/10/2024   HTN (hypertension) 07/10/2024   Tobacco use disorder 07/10/2024   Alcohol use disorder 07/10/2024   Hyperlipidemia 12/15/2018   Acute non Q wave MI (myocardial infarction), initial episode of care (HCC) 07/31/2017    Orientation RESPIRATION BLADDER Height & Weight     Self, Time, Situation, Place  Normal External catheter Weight: 167 lb 15.9 oz (76.2 kg) Height:  5' 4 (162.6 cm)  BEHAVIORAL SYMPTOMS/MOOD NEUROLOGICAL BOWEL NUTRITION STATUS      Continent Diet (Dysphagia 2 (Finely chopped);Mildly thick liquids (Level 2, nectar thick))  AMBULATORY STATUS COMMUNICATION OF NEEDS Skin    Extensive Assist Verbally Normal                       Personal Care Assistance Level of Assistance  Bathing, Feeding, Dressing Bathing Assistance: Maximum assistance Feeding assistance: Limited assistance Dressing Assistance: Maximum assistance     Functional Limitations Info  Sight, Hearing, Speech Sight Info: Adequate Hearing Info: Adequate Speech Info: Impaired    SPECIAL CARE FACTORS FREQUENCY  PT (By licensed PT), OT (By licensed OT), Speech therapy                    Contractures Contractures Info: Not present    Additional Factors Info  Code Status Code Status Info: FULL CODE             Current Medications (07/16/2024):  This is the current hospital active medication list Current Facility-Administered Medications  Medication Dose Route Frequency Provider Last Rate Last Admin   acetaminophen  (TYLENOL ) tablet 500 mg  500 mg Oral Q6H PRN Kandis Perkins, DO       amLODipine  (NORVASC ) tablet 5 mg  5 mg Oral Daily Koomson, Julius, MD   5 mg at 07/15/24 1043   aspirin  EC tablet 81 mg  81 mg Oral Daily King, Olivia, DO   81 mg at 07/15/24 1043   atorvastatin  (LIPITOR ) tablet 80 mg  80 mg Oral q1800 Kandis Perkins, DO   80 mg at 07/15/24 1703   cefTRIAXone  (ROCEPHIN ) 1 g in sodium chloride  0.9 % 100 mL IVPB  1 g Intravenous Q24H King, Olivia, DO   Stopped at 07/15/24 1111  clopidogrel  (PLAVIX ) tablet 75 mg  75 mg Oral Daily Trudy Mliss Dragon, MD   75 mg at 07/15/24 1043   diclofenac  Sodium (VOLTAREN ) 1 % topical gel 2 g  2 g Topical QID Nooruddin, Saad, MD   2 g at 07/15/24 2117   ezetimibe  (ZETIA ) tablet 10 mg  10 mg Oral Daily Kandis Perkins, DO   10 mg at 07/15/24 1043   folic acid  (FOLVITE ) tablet 1 mg  1 mg Oral Daily Bender, Perkins, DO   1 mg at 07/15/24 1043   insulin  aspart (novoLOG ) injection 0-15 Units  0-15 Units Subcutaneous TID WC Kandis Perkins, DO   3 Units at 07/16/24 9366   insulin  aspart (novoLOG ) injection 0-5 Units  0-5 Units Subcutaneous  QHS Kandis Perkins, DO   2 Units at 07/15/24 2115   insulin  glargine (LANTUS ) injection 10 Units  10 Units Subcutaneous QHS Koomson, Julius, MD   10 Units at 07/15/24 2117   ipratropium-albuterol  (DUONEB) 0.5-2.5 (3) MG/3ML nebulizer solution 3 mL  3 mL Nebulization Q4H PRN Kandis Perkins, DO       lidocaine  (LIDODERM ) 5 % 1 patch  1 patch Transdermal Daily Azadegan, Maryam, MD   1 patch at 07/15/24 1041   losartan  (COZAAR ) tablet 25 mg  25 mg Oral Daily Myrna, Olivia, DO   25 mg at 07/15/24 1043   multivitamin with minerals tablet 1 tablet  1 tablet Oral Daily Kandis Perkins, DO   1 tablet at 07/15/24 1043   nicotine  (NICODERM CQ  - dosed in mg/24 hours) patch 21 mg  21 mg Transdermal Daily Bender, Perkins, DO   21 mg at 07/14/24 0803   pantoprazole  (PROTONIX ) EC tablet 40 mg  40 mg Oral Daily Bender, Perkins, DO   40 mg at 07/15/24 1043   rivaroxaban  (XARELTO ) tablet 10 mg  10 mg Oral Q supper Trudy Mliss Dragon, MD   10 mg at 07/15/24 1703   thiamine  (VITAMIN B1) tablet 100 mg  100 mg Oral Daily Kandis Perkins, DO   100 mg at 07/15/24 1043   Or   thiamine  (VITAMIN B1) injection 100 mg  100 mg Intravenous Daily Kandis Perkins, DO   100 mg at 07/11/24 1058     Discharge Medications: Please see discharge summary for a list of discharge medications.  Relevant Imaging Results:  Relevant Lab Results:   Additional Information SS# [id number]  [name], [place]

## 2024-07-17 LAB — BASIC METABOLIC PANEL WITH GFR
Anion gap: 12 (ref 5–15)
BUN: 10 mg/dL (ref 8–23)
CO2: 30 mmol/L (ref 22–32)
Calcium: 9.8 mg/dL (ref 8.9–10.3)
Chloride: 99 mmol/L (ref 98–111)
Creatinine, Ser: 0.72 mg/dL (ref 0.44–1.00)
GFR, Estimated: >60 mL/min (ref >60)
Glucose, Bld: 209 mg/dL — ABNORMAL HIGH (ref 70–99)
Potassium: 3.9 mmol/L (ref 3.5–5.1)
Sodium: 141 mmol/L (ref 135–145)

## 2024-07-17 LAB — CBC
HCT: 46.1 % — ABNORMAL HIGH (ref 36.0–46.0)
Hemoglobin: 15.4 g/dL — ABNORMAL HIGH (ref 12.0–15.0)
MCH: 32.1 pg (ref 26.0–34.0)
MCHC: 33.4 g/dL (ref 30.0–36.0)
MCV: 96 fL (ref 80.0–100.0)
Platelets: 370 K/uL (ref 150–400)
RBC: 4.8 MIL/uL (ref 3.87–5.11)
RDW: 12.1 % (ref 11.5–15.5)
WBC: 7.8 K/uL (ref 4.0–10.5)
nRBC: 0 % (ref 0.0–0.2)

## 2024-07-17 LAB — GLUCOSE, CAPILLARY
Glucose-Capillary: 136 mg/dL — ABNORMAL HIGH (ref 70–99)
Glucose-Capillary: 335 mg/dL — ABNORMAL HIGH (ref 70–99)
Glucose-Capillary: 238 mg/dL — ABNORMAL HIGH (ref 70–99)
Glucose-Capillary: 333 mg/dL — ABNORMAL HIGH (ref 70–99)

## 2024-07-17 MED ORDER — SENNOSIDES-DOCUSATE SODIUM 8.6-50 MG PO TABS
1.0000 | ORAL_TABLET | Freq: Two times a day (BID) | ORAL | Status: DC
  Administered 2024-07-17 – 2024-07-19 (×5): 1 via ORAL
  Filled 2024-07-17 (×5): qty 1

## 2024-07-17 MED ORDER — ASPIRIN 81 MG PO CHEW
81.0000 mg | CHEWABLE_TABLET | Freq: Every day | ORAL | Status: DC
  Administered 2024-07-17 – 2024-07-19 (×3): 81 mg via ORAL
  Filled 2024-07-17 (×3): qty 1

## 2024-07-17 MED ORDER — INSULIN ASPART 100 UNIT/ML IJ SOLN
3.0000 [IU] | Freq: Three times a day (TID) | INTRAMUSCULAR | Status: DC
  Administered 2024-07-18 – 2024-07-19 (×5): 3 [IU] via SUBCUTANEOUS
  Filled 2024-07-17 (×4): qty 3

## 2024-07-17 MED ORDER — ATORVASTATIN CALCIUM 80 MG PO TABS
80.0000 mg | ORAL_TABLET | Freq: Every day | ORAL | Status: DC
  Administered 2024-07-18 – 2024-07-19 (×2): 80 mg via ORAL
  Filled 2024-07-17 (×2): qty 1

## 2024-07-17 MED ORDER — FLUCONAZOLE 200 MG PO TABS: 200.0000 mg | ORAL_TABLET | Freq: Every day | ORAL | Status: DC

## 2024-07-17 MED ORDER — INSULIN GLARGINE 100 UNIT/ML ~~LOC~~ SOLN
10.0000 [IU] | Freq: Every day | SUBCUTANEOUS | Status: DC
  Administered 2024-07-17: 10 [IU] via SUBCUTANEOUS
  Filled 2024-07-17: qty 0.1

## 2024-07-17 MED ORDER — RIVAROXABAN 10 MG PO TABS
10.0000 mg | ORAL_TABLET | Freq: Every day | ORAL | Status: DC
  Administered 2024-07-18 – 2024-07-19 (×2): 10 mg via ORAL
  Filled 2024-07-17 (×2): qty 1

## 2024-07-17 MED ORDER — GLUCERNA SHAKE PO LIQD
237.0000 mL | Freq: Three times a day (TID) | ORAL | Status: DC
  Administered 2024-07-17 – 2024-07-19 (×5): 237 mL via ORAL

## 2024-07-17 MED ORDER — LACTATED RINGERS IV BOLUS
1000.0000 mL | Freq: Once | INTRAVENOUS | Status: AC
  Administered 2024-07-17: 1000 mL via INTRAVENOUS

## 2024-07-17 MED ORDER — POLYETHYLENE GLYCOL 3350 17 G PO PACK
17.0000 g | PACK | Freq: Every day | ORAL | Status: DC
  Administered 2024-07-17 – 2024-07-19 (×3): 17 g via ORAL
  Filled 2024-07-17 (×4): qty 1

## 2024-07-17 MED ORDER — METFORMIN HCL 500 MG PO TABS
500.0000 mg | ORAL_TABLET | Freq: Every day | ORAL | Status: DC
  Administered 2024-07-18: 500 mg via ORAL
  Filled 2024-07-17: qty 1

## 2024-07-17 MED ORDER — FAMOTIDINE 20 MG PO TABS
40.0000 mg | ORAL_TABLET | Freq: Every day | ORAL | Status: DC
  Administered 2024-07-17 – 2024-07-19 (×3): 40 mg via ORAL
  Filled 2024-07-17 (×3): qty 2

## 2024-07-17 NOTE — Plan of Care (Signed)
   Problem: Education: Goal: Ability to describe self-care measures that may prevent or decrease complications (Diabetes Survival Skills Education) will improve Outcome: Progressing Goal: Individualized Educational Video(s) Outcome: Progressing   Problem: Coping: Goal: Ability to adjust to condition or change in health will improve Outcome: Progressing   Problem: Fluid Volume: Goal: Ability to maintain a balanced intake and output will improve Outcome: Progressing   Problem: Health Behavior/Discharge Planning: Goal: Ability to identify and utilize available resources and services will improve Outcome: Progressing Goal: Ability to manage health-related needs will improve Outcome: Progressing   Problem: Metabolic: Goal: Ability to maintain appropriate glucose levels will improve Outcome: Progressing   Problem: Skin Integrity: Goal: Risk for impaired skin integrity will decrease Outcome: Progressing

## 2024-07-17 NOTE — Plan of Care (Signed)
   Problem: Education: Goal: Ability to describe self-care measures that may prevent or decrease complications (Diabetes Survival Skills Education) will improve Outcome: Progressing Goal: Individualized Educational Video(s) Outcome: Progressing   Problem: Coping: Goal: Ability to adjust to condition or change in health will improve Outcome: Progressing

## 2024-07-17 NOTE — Inpatient Diabetes Management (Addendum)
 Inpatient Diabetes Program Recommendations  AACE/ADA: New Consensus Statement on Inpatient Glycemic Control (2015)  Target Ranges:  Prepandial:   less than 140 mg/dL      Peak postprandial:   less than 180 mg/dL (1-2 hours)      Critically ill patients:  140 - 180 mg/dL   Lab Results  Component Value Date   GLUCAP 335 (H) 07/17/2024   HGBA1C 9.5 (H) 07/10/2024    Review of Glycemic Control  Latest Reference Range & Units 07/16/24 06:18 07/16/24 12:19 07/16/24 16:27 07/16/24 21:17 07/17/24 06:02 07/17/24 11:14  Glucose-Capillary 70 - 99 mg/dL 825 (H) 774 (H) 724 (H) 278 (H) 136 (H) 335 (H)   Diabetes history: DM 2 Outpatient Diabetes medications:  Metformin  500 mg daily Current orders for Inpatient glycemic control:  Novolog  0-15 units tid with meals and HS Lantus  14 units daily Inpatient Diabetes Program Recommendations:    Note post-prandial increases in CBG's.  Consider adding Novolog  meal coverage 2-3 units tid with meals (hold if patient eats less than 50% or NPO).   Addendum:  52- Spoke briefly with patient at bedside regarding DM management and A1C of 9.5%.  She states she has never taken insulin  before and was only taking PO meds.  Attempted to discuss with patient and she handed me the phone to talk to daughter. Daughter states she and her siblings are still trying to decide about rehab.  I explained that her A1C is elevated and she may need insulin  at discharge as well.  Will need to make sure that family is included in teaching regarding DM and insulin  if it will be ordered at home.  Patient seems very overwhelmed at this time.  Will follow.  Thanks,  Randall Bullocks, RN, BC-ADM Inpatient Diabetes Coordinator Pager [phone]  (8a-5p)

## 2024-07-17 NOTE — Progress Notes (Signed)
 Occupational Therapy Treatment Patient Details Name: Darlene Pugh MRN: [id number] DOB: 07-15-1961 Today's Date: 07/17/2024   History of present illness Pt is a 63 y.o. female presenting 12/01 after being found down at home. MRI brain with acute infarct involving the left internal capsule/basal ganglia. 2. Chronic small vessel ischemic disease with multiple chronic lacunar infarcts. PMH: DM, HTN, vitamin D deficiency, osteoarthritis.   OT comments  Patient demonstrating good gains with OT treatment with min assist to get to EOB and for sit to stands from EOB and stedy.  Patient demonstrating increased functional use with RUE for self care tasks.  Patient will benefit from intensive inpatient follow-up therapy, >3 hours/day.  Acute OT to continue to follow to address established goals to facilitate DC to next venue of care.        If plan is discharge home, recommend the following:  Two people to help with walking and/or transfers;Assist for transportation;Help with stairs or ramp for entrance;A lot of help with bathing/dressing/bathroom;Assistance with cooking/housework;Two people to help with bathing/dressing/bathroom   Equipment Recommendations  Other (comment) (defer)    Recommendations for Other Services      Precautions / Restrictions Precautions Precautions: Fall Recall of Precautions/Restrictions: Impaired Restrictions Weight Bearing Restrictions Per Provider Order: No       Mobility Bed Mobility Overal bed mobility: Needs Assistance Bed Mobility: Supine to Sit     Supine to sit: Min assist     General bed mobility comments: getting to right side of bed patient required assistance to scoot towards EOB    Transfers Overall transfer level: Needs assistance Equipment used: Ambulation equipment used Transfers: Sit to/from Stand, Bed to chair/wheelchair/BSC Sit to Stand: Min assist           General transfer comment: min assist to stand from EOB and in  stedy Transfer via Lift Equipment: Stedy   Balance Overall balance assessment: Needs assistance Sitting-balance support: Bilateral upper extremity supported, Feet supported Sitting balance-Leahy Scale: Fair Sitting balance - Comments: CGA to supervision   Standing balance support: Bilateral upper extremity supported, During functional activity, Reliant on assistive device for balance Standing balance-Leahy Scale: Poor Standing balance comment: Reliant on therapists                           ADL either performed or assessed with clinical judgement   ADL Overall ADL's : Needs assistance/impaired Eating/Feeding: Minimal assistance;Sitting Eating/Feeding Details (indicate cue type and reason): assistance with opening containers Grooming: Wash/dry hands;Wash/dry face;Oral care;Supervision/safety;Sitting Grooming Details (indicate cue type and reason): able to use RUE to hold toothbrush when applying toothpaste             Lower Body Dressing: Maximal assistance;Sitting/lateral leans;Sit to/from stand Lower Body Dressing Details (indicate cue type and reason): to donn socks               General ADL Comments: increased RUE use with gross assist with self care    Extremity/Trunk Assessment Upper Extremity Assessment RUE Deficits / Details: increased grip strength and able to use with grooming tasks as gross assist RUE Sensation: decreased proprioception;decreased light touch RUE Coordination: decreased fine motor;decreased gross motor            Vision       Perception     Praxis     Communication Communication Communication: Impaired Factors Affecting Communication: Difficulty expressing self;Reduced clarity of speech   Cognition Arousal: Alert Behavior During Therapy: Flat affect,  WFL for tasks assessed/performed, Impulsive Cognition: Cognition impaired     Awareness: Online awareness impaired Memory impairment (select all impairments):  Short-term memory, Working civil service fast streamer, Conservation officer, historic buildings Attention impairment (select first level of impairment): Sustained attention Executive functioning impairment (select all impairments): Organization, Problem solving OT - Cognition Comments: attempting to stand before Stedy was setup                 Following commands: Impaired Following commands impaired: Follows one step commands with increased time      Cueing   Cueing Techniques: Verbal cues, Gestural cues, Tactile cues  Exercises      Shoulder Instructions       General Comments      Pertinent Vitals/ Pain       Pain Assessment Pain Assessment: No/denies pain  Home Living       Type of Home: Apartment                              Lives With: Alone    Prior Functioning/Environment              Frequency  Min 2X/week        Progress Toward Goals  OT Goals(current goals can now be found in the care plan section)  Progress towards OT goals: Progressing toward goals  Acute Rehab OT Goals Patient Stated Goal: to go to rehab OT Goal Formulation: With patient Time For Goal Achievement: 07/25/24 Potential to Achieve Goals: Good ADL Goals Pt Will Perform Grooming: with set-up;sitting Pt Will Perform Upper Body Dressing: with set-up;sitting Pt Will Perform Lower Body Dressing: with min assist;sit to/from stand Pt Will Transfer to Toilet: with min assist;ambulating Pt/caregiver will Perform Home Exercise Program: Right Upper extremity;With written HEP provided;Increased strength  Plan      Co-evaluation                 AM-PAC OT 6 Clicks Daily Activity     Outcome Measure   Help from another person eating meals?: A Little Help from another person taking care of personal grooming?: A Little Help from another person toileting, which includes using toliet, bedpan, or urinal?: A Lot Help from another person bathing (including washing, rinsing, drying)?: A Lot Help  from another person to put on and taking off regular upper body clothing?: A Lot Help from another person to put on and taking off regular lower body clothing?: A Lot 6 Click Score: 14    End of Session Equipment Utilized During Treatment: Gait belt;Other (comment) Laurent)  OT Visit Diagnosis: Unsteadiness on feet (R26.81);Muscle weakness (generalized) (M62.81);Other symptoms and signs involving cognitive function;Hemiplegia and hemiparesis Hemiplegia - Right/Left: Right Hemiplegia - dominant/non-dominant: Dominant Hemiplegia - caused by: Cerebral infarction   Activity Tolerance Patient tolerated treatment well   Patient Left in chair;with call bell/phone within reach;with chair alarm set   Nurse Communication Mobility status        Time: 9251-9186 OT Time Calculation (min): 25 min  Charges: OT General Charges $OT Visit: 1 Visit OT Treatments $Self Care/Home Management : 8-22 mins $Therapeutic Activity: 8-22 mins  Dick Pugh, OTA Acute Rehabilitation Services  Office [phone]   Darlene Pugh 07/17/2024, 12:59 PM

## 2024-07-17 NOTE — Progress Notes (Signed)
 Speech Language Pathology Treatment: Dysphagia  Patient Details Name: Darlene Pugh MRN: [id number] DOB: April 12, 1961 Today's Date: 07/17/2024 Time: 9062-9050 SLP Time Calculation (min) (ACUTE ONLY): 12 min  Assessment / Plan / Recommendation Clinical Impression  Pt seen with several bites of breakfast tray. Unable to state reason for thick liquids and SLP reviewed MBS results. Able to recall one of two swallow strategies. Dentures not donned and pt stated she does not typically wear them when eating. Educated pt they may assist with mastication now with right sided weakness post stroke however she prefers to not wear them. Mastication was slow, oral cavity appeared clear when inspected but continued to masticate possible small piece oral residue. She stated it is not taking her longer to chew than normal and she does not like her current diet texture. Continue Dys 2, nectar and plan to repeat MBS tomorrow for possible upgrade in liquid and food texture.   HPI HPI: Darlene Pugh is a 63 year old female admitted after fall. MRI acute infarct involving the left internal capsule/basal ganglia, chronic small vessel ischemic disease with multiple chronic lacunar infarcts. CXR no acute cardiopulmonary abnormality. PMH:   diabetes, hypertension, vitamin D deficiency, osteoarthritis.      SLP Plan  MBS;Continue with current plan of care        Swallow Evaluation Recommendations   Recommendations: PO diet PO Diet Recommendation: Dysphagia 2 (Finely chopped);Mildly thick liquids (Level 2, nectar thick) Liquid Administration via: Cup;Straw Medication Administration: Whole meds with puree Supervision: Patient able to self-feed (needs set up) Postural changes: Position pt fully upright for meals Oral care recommendations: Oral care BID (2x/day)     Recommendations                     Oral care BID   Frequent or constant Supervision/Assistance Dysphagia, oropharyngeal  phase (R13.12)     MBS;Continue with current plan of care     Dustin Olam Bull  07/17/2024, 10:19 AM

## 2024-07-17 NOTE — Progress Notes (Addendum)
 Inpatient Rehab Admissions Coordinator:  Spoke with pt's daughter Calvert regarding pt's dispo after discharge. She would like another day to discuss dispo with family prior to making a decision regarding CIR. Will continue to follow.  Pt's daughter contacted [facility] to inform her that family has decided that pt should pursue CIR. Calvert confirmed that family will be able to provide 24/7 support after discharge. Primary AC will follow up.   Tinnie Yvone Cohens, MS, CCC-SLP Admissions Coordinator [phone]

## 2024-07-17 NOTE — Progress Notes (Signed)
 Physical Therapy Treatment Patient Details Name: Darlene Pugh MRN: [id number] DOB: 08/17/1960 Today's Date: 07/17/2024   History of Present Illness Pt is a 63 y.o. female presenting 12/01 after being found down at home. MRI brain with acute infarct involving the left internal capsule/basal ganglia. 2. Chronic small vessel ischemic disease with multiple chronic lacunar infarcts. PMH: DM, HTN, vitamin D deficiency, osteoarthritis.    PT Comments  Pt tolerated treatment well today. Pt today was able to progress to gait training. Pt today able to ambulate to door and back with RW. 1 seated rest break. Once at door pt noted with significant knee buckling requiring +2 Mod/Max to correct. Mostly +2 Min A otherwise. Would benefit from close chair follow next session. No change in DC/DME recs at this time. PT will continue to follow.     If plan is discharge home, recommend the following: Two people to help with walking and/or transfers;A lot of help with bathing/dressing/bathroom;Assistance with cooking/housework;Assistance with feeding;Direct supervision/assist for medications management;Direct supervision/assist for financial management;Assist for transportation;Help with stairs or ramp for entrance;Supervision due to cognitive status   Can travel by private vehicle        Equipment Recommendations  Wheelchair (measurements PT);Wheelchair cushion (measurements PT);Hospital bed;Hoyer lift    Recommendations for Other Services       Precautions / Restrictions Precautions Precautions: Fall Recall of Precautions/Restrictions: Impaired Restrictions Weight Bearing Restrictions Per Provider Order: No     Mobility  Bed Mobility Overal bed mobility: Needs Assistance Bed Mobility: Sit to Supine, Supine to Sit     Supine to sit: Contact guard, Used rails, HOB elevated Sit to supine: Contact guard assist, Used rails, HOB elevated   General bed mobility comments: CGA for safety. Pt used  rails    Transfers Overall transfer level: Needs assistance Equipment used: Rolling walker (2 wheels) Transfers: Sit to/from Stand Sit to Stand: Contact guard assist           General transfer comment: Pt stood on her own today with no physical assistance.    Ambulation/Gait Ambulation/Gait assistance: +2 physical assistance, +2 safety/equipment, Min assist, Mod assist, Max assist Gait Distance (Feet): 15 Feet Assistive device: Rolling walker (2 wheels) Gait Pattern/deviations: Knees buckling, Trunk flexed, Wide base of support, Ataxic, Decreased stride length, Step-through pattern Gait velocity: decreased     General Gait Details: Pt today able to ambulate to door and back with RW. 1 seated rest break. Once at door pt noted with significant knee buckling requiring +2 Mod/Max to correct. Mostly +2 Min A otherwise. Would benefit from close chair follow next session.   Stairs             Wheelchair Mobility     Tilt Bed    Modified Rankin (Stroke Patients Only)       Balance Overall balance assessment: Needs assistance Sitting-balance support: Bilateral upper extremity supported, Feet supported Sitting balance-Leahy Scale: Fair Sitting balance - Comments: CGA to supervision   Standing balance support: Bilateral upper extremity supported, During functional activity, Reliant on assistive device for balance Standing balance-Leahy Scale: Poor Standing balance comment: Reliant on therapists                            Communication Communication Communication: Impaired Factors Affecting Communication: Difficulty expressing self;Reduced clarity of speech  Cognition Arousal: Alert Behavior During Therapy: Flat affect, WFL for tasks assessed/performed, Impulsive  Following commands: Impaired Following commands impaired: Follows one step commands with increased time    Cueing Cueing Techniques: Verbal cues,  Gestural cues, Tactile cues  Exercises      General Comments General comments (skin integrity, edema, etc.): VSS      Pertinent Vitals/Pain Pain Assessment Pain Assessment: No/denies pain    Home Living                          Prior Function            PT Goals (current goals can now be found in the care plan section) Progress towards PT goals: Progressing toward goals    Frequency    Min 2X/week      PT Plan      Co-evaluation              AM-PAC PT 6 Clicks Mobility   Outcome Measure  Help needed turning from your back to your side while in a flat bed without using bedrails?: A Lot Help needed moving from lying on your back to sitting on the side of a flat bed without using bedrails?: A Lot Help needed moving to and from a bed to a chair (including a wheelchair)?: A Lot Help needed standing up from a chair using your arms (e.g., wheelchair or bedside chair)?: A Lot Help needed to walk in hospital room?: Total Help needed climbing 3-5 steps with a railing? : Total 6 Click Score: 10    End of Session Equipment Utilized During Treatment: Gait belt Activity Tolerance: Patient tolerated treatment well Patient left: in bed;with call bell/phone within reach;with bed alarm set Nurse Communication: Mobility status;Need for lift equipment PT Visit Diagnosis: Other abnormalities of gait and mobility (R26.89)     Time: 8493-8474 PT Time Calculation (min) (ACUTE ONLY): 19 min  Charges:    $Gait Training: 8-22 mins PT General Charges $$ ACUTE PT VISIT: 1 Visit                     Azaria Bartell B, PT, DPT Acute Rehab Services [phone]    Darlene Pugh 07/17/2024, 4:32 PM

## 2024-07-17 NOTE — TOC Progression Note (Addendum)
 Transition of Care [facility]) - Progression Note    Patient Details  Name: Darlene Pugh MRN: [id number] Date of Birth: 24-Mar-1961  [facility] of [facility] CM/SW Contact  Andrez JULIANNA George, RN Phone Number: 07/17/2024, 1:09 PM  Clinical Narrative:     Family is working on support for after rehab. CM did call and offer daughter, [facility] as a choice. She still wants the evening to discuss with family. CM following for potential CIR admit if insurance approves.   Expected Discharge Plan: IP Rehab Facility                 Expected Discharge Plan and Services                                               Social Drivers of Health (SDOH) Interventions SDOH Screenings   Food Insecurity: No Food Insecurity (07/10/2024)  Housing: Low Risk  (07/10/2024)  Transportation Needs: Unmet Transportation Needs (07/10/2024)  Utilities: Not At Risk (07/10/2024)  Financial Resource Strain: Not on File (11/27/2021)   Received from [facility]  Physical Activity: Not on File (11/27/2021)   Received from [facility]  Social Connections: Not on File (05/03/2023)   Received from [facility]  Stress: Not on File (11/27/2021)   Received from [facility]  Tobacco Use: High Risk (07/11/2024)    Readmission Risk Interventions     No data to display

## 2024-07-17 NOTE — Progress Notes (Signed)
 HD#7 SUBJECTIVE:  Patient Summary: Darlene Pugh is a 63 y.o. person living with a history of type 2 diabetes, hypertension, osteoarthritis, vitamin D deficiency who presented with right-sided weakness after being found down at home and admitted for CVA on hospital day 7.  Overnight Events:No acute events overnight  Interim History: The patient was seen and examined at the bedside this morning. She is feeling okay. She reports that she is excited to have thin liquids again and that she is transferring to the chair significantly easier than she was before. Overall, things are looking up. She would like to go home.   OBJECTIVE:  Vital Signs: Vitals:   07/17/24 0000 07/17/24 0500 07/17/24 0744 07/17/24 1114  BP: (!) 143/89 99/83 125/85 135/78  Pulse: 85 88 90 94  Resp: 18 16 17 18   Temp: 98.2 F (36.8 C) 98.5 F (36.9 C) 98.2 F (36.8 C) 98 F (36.7 C)  TempSrc: Oral Oral Oral Oral  SpO2: 96% 94% 94% 93%  Weight:      Height:       Supplemental O2: Room Air SpO2: 93 % O2 Flow Rate (L/min): 2 L/min  Filed Weights   07/10/24 1000 07/10/24 1047  Weight: 76.2 kg 76.2 kg     Intake/Output Summary (Last 24 hours) at 07/17/2024 1516 Last data filed at 07/17/2024 1114 Gross per 24 hour  Intake 100 ml  Output 1400 ml  Net -1300 ml   Net IO Since Admission: -3,058.23 mL [07/17/24 1516]  Physical Exam: Const: Awake, alert in NAD HENT: Normocephalic, atraumatic, mucus membranes moist Card: RRR, No MRG, No pitting edema on LE's bilaterally  Resp: No increased work of breathing. Minimal rales noted in LLL Abd: Soft, NTND, Bsx4 Extremities: Warm. Left knee with ace bandage and lidocaine  patch in place. Effusion reduced since Saturday.  Neuro: Right facial droop noted, unchanged.  Weakness noted in RUE and RLE extremity, improving.   Patient Lines/Drains/Airways Status     Active Line/Drains/Airways     Name Placement date Placement time Site Days   Peripheral IV  07/13/24 22 G 1 Posterior;Right Forearm 07/13/24  0020  Forearm  4   External Urinary Catheter 07/13/24  0726  --  4            Pertinent labs and imaging:      Latest Ref Rng & Units 07/17/2024    9:00 AM 07/14/2024    5:28 AM 07/12/2024    1:50 AM  CBC  WBC 4.0 - 10.5 K/uL 7.8  7.7  8.3   Hemoglobin 12.0 - 15.0 g/dL 84.5  86.5  85.4   Hematocrit 36.0 - 46.0 % 46.1  39.3  43.9   Platelets 150 - 400 K/uL 370  256  261        Latest Ref Rng & Units 07/17/2024    9:00 AM 07/14/2024    5:28 AM 07/13/2024    7:22 AM  CMP  Glucose 70 - 99 mg/dL 790  868  849   BUN 8 - 23 mg/dL 10  10  6    Creatinine 0.44 - 1.00 mg/dL 9.27  9.18  9.47   Sodium 135 - 145 mmol/L 141  139  137   Potassium 3.5 - 5.1 mmol/L 3.9  3.4  3.8   Chloride 98 - 111 mmol/L 99  97  102   CO2 22 - 32 mmol/L 30  29  24    Calcium  8.9 - 10.3 mg/dL 9.8  8.8  8.2   Total Protein 6.5 - 8.1 g/dL  6.1    Total Bilirubin 0.0 - 1.2 mg/dL  2.6    Alkaline Phos 38 - 126 U/L  74    AST 15 - 41 U/L  12    ALT 0 - 44 U/L  13      ASSESSMENT/PLAN:  Assessment: Principal Problem:   Acute CVA (cerebrovascular accident) (HCC) Active Problems:   Hypokalemia   T2DM (type 2 diabetes mellitus) (HCC)   HTN (hypertension)   Tobacco use disorder   Alcohol use disorder   Hyperlipidemia   Hemiparesis of right dominant side due to acute cerebrovascular disease (HCC)   Dysphagia due to recent cerebrovascular accident (CVA)   Adjustment reaction with anxiety and depression   Dysarthria due to acute cerebrovascular accident (CVA) (HCC)   Cough, unspecified; aspiration (related to CVA) suspected, no pneumonia   Effusion of left knee joint after fall   Left knee pain   Plan: #Acute left internal capsule/basal ganglia CVA #Right Hemiparesis #Dysphagia #Dysarthria -The patient was admitted on 12/1 with MRI findings of a left intracranial capsule/basal ganglia CVA. -Neurology has signed off. -TTE without atrial  shunt -Continue aspirin  and Plavix --Plavix  for 3 weeks followed by aspirin  alone  -PT/OT recommended CIR, referral placed and conversation with family about discharge planning underway.  -SLP following, repeat MBS tomorrow morning with hopefully advancement in her diet.  -Remains with poor oral fluid intake, will replenish with IV fluids.   #UTI #Urinary retention, resolved (behavioral) - The patient is voiding appropriately with prompting. -Monitor closely and continue prompted voiding.  - UA obtained and positive for leukocytes and many bacteria with white blood cells. -Ceftriaxone  day 3/3 - Urine culture positive for yeast, however patient asymptomatic. Will hold off on treatment as this is likely contaminant.    #Left knee pain - Greatly improved with conservative management.  #Hypertension - Normotensive today. -Continue losartan  and amlodipine .  #Type 2 diabetes, uncontrolled -Last A1c 9.2 -Lantus  adjusted to 10units and 3 units meal time coverage added.  -Continue sliding scale insulin .  # Nicotine  dependence -The patient has significant History of cigarello use. -Continue nicotine  patch  #Alcohol use disorder -The patient was to drink several glasses of liquor a day with additional beer occasionally. - No signs of withdrawal this hospitalization. - CIWA protocol discontinued  #Depression -Will continue to monitor for depressive symptoms closely and continue conversations about benefits of SSRI initiation, however will hold off on adding medication as the patient is hesitant.  -Pt is seemingly doing much better mentally and is accepting her prognosis. She continues to improve daily and is very encouraged about this.   #Disposition Darlene Pugh will need either CIR or SNF level therapies.  We appreciate TOC conversations behind the scenes.  Darlene Pugh is very frustrated as anyone would be with her new functional deficits.  - Continue discussions with family members as to  the appropriate next steps  Best Practice: Diet: Dysphagia 2 with nectar thick liquids IVF: Fluids: LR, Rate: 1000 cc bolus VTE: rivaroxaban  (XARELTO ) tablet 10 mg Start: 07/14/24 1700 Code: Full  Disposition planning: Therapy Recs: Pending,  DISPO: Pending clinical improvement  Signature:  Schuyler Novak, DO [facility]  3:16 PM, 07/17/2024  On Call pager [phone]

## 2024-07-17 NOTE — Evaluation (Signed)
 Speech Language Pathology Evaluation Patient Details Name: Darlene Pugh MRN: [id number] DOB: 1960-09-02 Today's Date: 07/17/2024 Time: 0950-1004 SLP Time Calculation (min) (ACUTE ONLY): 14 min  Problem List:  Patient Active Problem List   Diagnosis Date Noted   Effusion of left knee joint after fall 07/13/2024   Left knee pain 07/13/2024   Hemiparesis of right dominant side due to acute cerebrovascular disease (HCC) 07/12/2024   Dysphagia due to recent cerebrovascular accident (CVA) 07/12/2024   Adjustment reaction with anxiety and depression 07/12/2024   Dysarthria due to acute cerebrovascular accident (CVA) (HCC) 07/12/2024   Cough, unspecified; aspiration (related to CVA) suspected, no pneumonia 07/12/2024   Acute CVA (cerebrovascular accident) (HCC) 07/10/2024   Hypokalemia 07/10/2024   T2DM (type 2 diabetes mellitus) (HCC) 07/10/2024   HTN (hypertension) 07/10/2024   Tobacco use disorder 07/10/2024   Alcohol use disorder 07/10/2024   Hyperlipidemia 12/15/2018   Acute non Q wave MI (myocardial infarction), initial episode of care [facility]) 07/31/2017   Past Medical History:  Past Medical History:  Diagnosis Date   Diabetes mellitus without complication (HCC)    Hypertension    Past Surgical History:  Past Surgical History:  Procedure Laterality Date   LEFT HEART CATH AND CORONARY ANGIOGRAPHY N/A 08/02/2017   Procedure: LEFT HEART CATH AND CORONARY ANGIOGRAPHY;  Surgeon: Levern Hutching, MD;  Location: [facility] INVASIVE CV LAB;  Service: Cardiovascular;  Laterality: N/A;   HPI:  Darlene Pugh is a 63 year old female admitted after fall. MRI acute infarct involving the left internal capsule/basal ganglia, chronic small vessel ischemic disease with multiple chronic lacunar infarcts. CXR no acute cardiopulmonary abnormality. PMH:   diabetes, hypertension, vitamin D deficiency, osteoarthritis.   Assessment / Plan / Recommendation Clinical Impression  Pt lived alone  prior to admission and plan is for rehab at [facility]. She states her speech is not at baseline and initially states sometimes she forgets things but at end of session stated memory is fine. Language is intact. Given parts of Cognistat impairments noted  in 4 word recall 0/4 with difficulty in mainly in retrieval and intellectual and emergent awareness of impairments. She reports she sometimes don't want to take my meds and manages her finances, paying bills in person with money order.  ST will continue to see for dysarthria and cognitive deficits working toward independence for functioal activities.    SLP Assessment  SLP Recommendation/Assessment: Patient needs continued Speech Language Pathology Services SLP Visit Diagnosis: Dysarthria and anarthria (R47.1);Cognitive communication deficit (R41.841)     Assistance Recommended at Discharge  Frequent or constant Supervision/Assistance  Functional Status Assessment Patient has had a recent decline in their functional status and demonstrates the ability to make significant improvements in function in a reasonable and predictable amount of time.  Frequency and Duration min 2x/week  2 weeks      SLP Evaluation Cognition  Overall Cognitive Status: Impaired/Different from baseline Arousal/Alertness: Awake/alert Orientation Level: Oriented to person;Oriented to place;Oriented to situation (not oriented to date, and correct with DOW with extra time, needed repetition of city x 2) Year: 2025 Month: December Day of Week: Correct Attention: Sustained Sustained Attention: Appears intact Memory: Impaired Memory Impairment:  (0/4 recall, severe impairment) Awareness: Impaired Awareness Impairment: Intellectual impairment;Emergent impairment Problem Solving:  (functional for verbal tasks) Safety/Judgment: Other (comment) (needs supervision initially)       Comprehension  Auditory Comprehension Overall Auditory Comprehension: Appears within  functional limits for tasks assessed Commands:  (needed repetition for 3 step due to  memory not comprehension) Visual Recognition/Discrimination Discrimination: Not tested Reading Comprehension Reading Status: Not tested    Expression Expression Primary Mode of Expression: Verbal Verbal Expression Overall Verbal Expression: Appears within functional limits for tasks assessed Initiation: No impairment Level of Generative/Spontaneous Verbalization: Conversation Repetition:  (NT) Naming: No impairment Pragmatics: No impairment Written Expression Dominant Hand: Right Written Expression: Not tested   Oral / Motor  Motor Speech Overall Motor Speech: Impaired Respiration: Within functional limits Phonation: Low vocal intensity Resonance: Within functional limits Articulation: Impaired Level of Impairment: Conversation Intelligibility: Intelligible Motor Planning: Within functional limits Motor Speech Errors: Not applicable            Dustin Olam Bull 07/17/2024, 10:45 AM

## 2024-07-18 ENCOUNTER — Inpatient Hospital Stay (HOSPITAL_COMMUNITY)

## 2024-07-18 LAB — GLUCOSE, CAPILLARY
Glucose-Capillary: 196 mg/dL — ABNORMAL HIGH (ref 70–99)
Glucose-Capillary: 171 mg/dL — ABNORMAL HIGH (ref 70–99)
Glucose-Capillary: 140 mg/dL — ABNORMAL HIGH (ref 70–99)
Glucose-Capillary: 190 mg/dL — ABNORMAL HIGH (ref 70–99)
Glucose-Capillary: 205 mg/dL — ABNORMAL HIGH (ref 70–99)

## 2024-07-18 MED ORDER — METFORMIN HCL ER 500 MG PO TB24
500.0000 mg | ORAL_TABLET | Freq: Every day | ORAL | Status: DC
  Administered 2024-07-19: 500 mg via ORAL
  Filled 2024-07-18: qty 1

## 2024-07-18 MED ORDER — DAPAGLIFLOZIN PROPANEDIOL 10 MG PO TABS
10.0000 mg | ORAL_TABLET | Freq: Every day | ORAL | Status: DC
  Administered 2024-07-18 – 2024-07-19 (×2): 10 mg via ORAL
  Filled 2024-07-18 (×4): qty 1

## 2024-07-18 MED ORDER — INSULIN GLARGINE 100 UNIT/ML ~~LOC~~ SOLN
14.0000 [IU] | Freq: Every day | SUBCUTANEOUS | Status: DC
  Filled 2024-07-18: qty 0.14

## 2024-07-18 MED ORDER — INSULIN GLARGINE 100 UNIT/ML ~~LOC~~ SOLN
14.0000 [IU] | Freq: Every day | SUBCUTANEOUS | Status: DC
  Administered 2024-07-18 – 2024-07-19 (×2): 14 [IU] via SUBCUTANEOUS
  Filled 2024-07-18 (×2): qty 0.14

## 2024-07-18 NOTE — Progress Notes (Signed)
 HD#7 SUBJECTIVE:  Patient Summary: Darlene Pugh is a 63 y.o. person living with a history of type 2 diabetes, hypertension, osteoarthritis, vitamin D deficiency who presented with right-sided weakness after being found down at home and admitted for CVA on hospital day 7.  Overnight Events:No acute events overnight  Interim History: The patient was seen and examined at the bedside this morning. She is feeling much better and is proud of herself for her recovery so far. She is looking forward to going to rehab for further recover. Overall, she is in much better spirits  OBJECTIVE:  Vital Signs: Vitals:   07/17/24 2342 07/18/24 0330 07/18/24 0731 07/18/24 1122  BP: (!) 143/78 123/81 117/89 131/84  Pulse: 82 80 92 99  Resp: 17 17 16 16   Temp: (!) 97.5 F (36.4 C) 98 F (36.7 C) 98.6 F (37 C) 97.9 F (36.6 C)  TempSrc: Oral Oral  Oral  SpO2: 99% 95% 98% 97%  Weight:      Height:       Supplemental O2: Room Air SpO2: 97 % O2 Flow Rate (L/min): 2 L/min  Filed Weights   07/10/24 1000 07/10/24 1047  Weight: 76.2 kg 76.2 kg     Intake/Output Summary (Last 24 hours) at 07/18/2024 1355 Last data filed at 07/17/2024 1800 Gross per 24 hour  Intake 292.94 ml  Output 400 ml  Net -107.06 ml   Net IO Since Admission: -3,165.29 mL [07/18/24 1355]  Physical Exam: Const: Awake, alert in NAD HENT: Normocephalic, atraumatic, mucus membranes moist Card: RRR, No MRG, No pitting edema on LE's bilaterally  Resp: No increased work of breathing. Minimal rales noted in LLL Abd: Soft, NTND, Bsx4 Extremities: Warm. Left knee with ace bandage and lidocaine  patch in place. Effusion reduced since Saturday.  Neuro: Right facial droop noted, unchanged.  Weakness noted in RUE and RLE extremity, improving significantly.   Patient Lines/Drains/Airways Status     Active Line/Drains/Airways     Name Placement date Placement time Site Days   Peripheral IV 07/13/24 22 G 1  Posterior;Right Forearm 07/13/24  0020  Forearm  5   External Urinary Catheter 07/13/24  0726  --  5            Pertinent labs and imaging:      Latest Ref Rng & Units 07/17/2024    9:00 AM 07/14/2024    5:28 AM 07/12/2024    1:50 AM  CBC  WBC 4.0 - 10.5 K/uL 7.8  7.7  8.3   Hemoglobin 12.0 - 15.0 g/dL 84.5  86.5  85.4   Hematocrit 36.0 - 46.0 % 46.1  39.3  43.9   Platelets 150 - 400 K/uL 370  256  261        Latest Ref Rng & Units 07/17/2024    9:00 AM 07/14/2024    5:28 AM 07/13/2024    7:22 AM  CMP  Glucose 70 - 99 mg/dL 790  868  849   BUN 8 - 23 mg/dL 10  10  6    Creatinine 0.44 - 1.00 mg/dL 9.27  9.18  9.47   Sodium 135 - 145 mmol/L 141  139  137   Potassium 3.5 - 5.1 mmol/L 3.9  3.4  3.8   Chloride 98 - 111 mmol/L 99  97  102   CO2 22 - 32 mmol/L 30  29  24    Calcium  8.9 - 10.3 mg/dL 9.8  8.8  8.2   Total Protein 6.5 -  8.1 g/dL  6.1    Total Bilirubin 0.0 - 1.2 mg/dL  2.6    Alkaline Phos 38 - 126 U/L  74    AST 15 - 41 U/L  12    ALT 0 - 44 U/L  13      ASSESSMENT/PLAN:  Assessment: Principal Problem:   Acute CVA (cerebrovascular accident) (HCC) Active Problems:   Hypokalemia   T2DM (type 2 diabetes mellitus) (HCC)   HTN (hypertension)   Tobacco use disorder   Alcohol use disorder   Hyperlipidemia   Hemiparesis of right dominant side due to acute cerebrovascular disease (HCC)   Dysphagia due to recent cerebrovascular accident (CVA)   Adjustment reaction with anxiety and depression   Dysarthria due to acute cerebrovascular accident (CVA) (HCC)   Cough, unspecified; aspiration (related to CVA) suspected, no pneumonia   Effusion of left knee joint after fall   Left knee pain   Plan: #Acute left internal capsule/basal ganglia CVA #Right Hemiparesis #Dysphagia #Dysarthria -The patient was admitted on 12/1 with MRI findings of a left intracranial capsule/basal ganglia CVA. -Neurology has signed off. -TTE without atrial shunt -Continue aspirin  and  Plavix --Plavix  for 3 weeks followed by aspirin  alone  -PT/OT recommended CIR, referral placed and conversation with family about discharge planning underway.  -MBS repeated this AM and diet changed to carb modified with thin liquids, will continue to encourage orla fluid intake.  #UTI #Urinary retention, resolved (behavioral) -Resolved -s/p 3 days rocephin    #Left knee pain - Greatly improved with conservative management.  #Hypertension - Normotensive today. -Continue losartan  and amlodipine .  #Type 2 diabetes, uncontrolled -Last A1c 9.2 -Lantus  adjusted to 14units and 3 units meal time coverage added.  -Continue sliding scale insulin .  # Nicotine  dependence -The patient has significant History of cigarello use. -Continue nicotine  patch  #Alcohol use disorder -The patient was to drink several glasses of liquor a day with additional beer occasionally. - No signs of withdrawal this hospitalization. - CIWA protocol discontinued  #Depression -Will continue to monitor for depressive symptoms closely and continue conversations about benefits of SSRI initiation, however will hold off on adding medication as the patient is hesitant.  -Pt is seemingly doing much better mentally and is accepting her prognosis. She continues to improve daily and is very encouraged about this.   #Disposition -The family has decided to proceed with CIR. Awaiting insurance authorization.   Best Practice: Diet: Dysphagia 2 with nectar thick liquids IVF: Fluids: LR, Rate: 1000 cc bolus VTE: rivaroxaban  (XARELTO ) tablet 10 mg Start: 07/18/24 1000 Code: Full  Disposition planning: Therapy Recs: Pending,  DISPO: Pending clinical improvement  Signature:  Schuyler Novak, DO [facility]  1:55 PM, 07/18/2024  On Call pager [phone]

## 2024-07-18 NOTE — Progress Notes (Signed)
   Inpatient Rehabilitation Admissions Coordinator   I spoke with daughter, Sharene, by phone. Family to arrange 24/7 supervision at home after a CIR admit. I will begin insurance approval for possible Cir admit.  Heron Leavell, RN, MSN Rehab Admissions Coordinator [phone] 07/18/2024 8:28 AM

## 2024-07-18 NOTE — TOC Progression Note (Signed)
 Transition of Care [facility]) - Progression Note    Patient Details  Name: Darlene Pugh MRN: [id number] Date of Birth: Apr 20, 1961  Transition of Care [facility]) CM/SW Contact  Andrez JULIANNA George, RN Phone Number: 07/18/2024, 11:18 AM  Clinical Narrative:     Family is able to provide needed support. CIR is starting insurance auth.  IP Care management following.   Expected Discharge Plan: IP Rehab Facility                 Expected Discharge Plan and Services                                               Social Drivers of Health (SDOH) Interventions SDOH Screenings   Food Insecurity: No Food Insecurity (07/10/2024)  Housing: Low Risk  (07/10/2024)  Transportation Needs: Unmet Transportation Needs (07/10/2024)  Utilities: Not At Risk (07/10/2024)  Financial Resource Strain: Not on File (11/27/2021)   Received from [facility]  Physical Activity: Not on File (11/27/2021)   Received from [facility]  Social Connections: Not on File (05/03/2023)   Received from [facility]  Stress: Not on File (11/27/2021)   Received from [facility]  Tobacco Use: High Risk (07/11/2024)    Readmission Risk Interventions     No data to display

## 2024-07-18 NOTE — Progress Notes (Signed)
 Modified Barium Swallow Study  Patient Details  Name: Darlene Pugh MRN: [id number] Date of Birth: [birth year]  Today's Date: 07/18/2024  Modified Barium Swallow completed.  Full report located under Chart Review in the Imaging Section.  History of Present Illness Darlene Pugh is a 63 year old female admitted after fall. MRI acute infarct involving the left internal capsule/basal ganglia, chronic small vessel ischemic disease with multiple chronic lacunar infarcts. CXR no acute cardiopulmonary abnormality. PMH:   diabetes, hypertension, vitamin D deficiency, osteoarthritis.Repeat MBS for possible liquid/texture upgrade.   Clinical Impression Pt demonstrated improved swallow function from prior MBS. There was mild decreased lingual control with intermittent premature spill to valleculae and significantly less oral residue from previous study. Laryngeal strength and mobility are adequate with pt demonstrating decreased pharyngeal timing with barium filling  pyriforms resulting in one episode of aspiration, clearing with strong reflexive cough (PAS 6). Penetration resulted from premature spill with thin into vestibule once but was ejected during the swallow (PAS 2). A cue to orally hold smaller bolus for several seconds prior to transit resulted in only intermittent trace spill to valleculae with laryngeal protection during the swallow. She was able to consistently perform strategy over multiple trials. Trace barium ascended from PES x 1 and esophageal scan revealed mild retention. Mastication mildly slow without residue during or study or when observed with additional trial cracker after study completed. Recommend regular texture (dentures donned), small sips with brief bolus hold, pills whole in puree and check right side oral cavity for potential residue. Factors that may increase risk of adverse event in presence of aspiration Noe & Lianne 2021): Reduced cognitive  function  Swallow Evaluation Recommendations Recommendations: PO diet PO Diet Recommendation: Regular;Thin liquids (Level 0) Liquid Administration via: Cup;Straw Medication Administration: Whole meds with puree Supervision: Patient able to self-feed;Full supervision/cueing for swallowing strategies (initially) Swallowing strategies  : Slow rate;Small bites/sips;Check for pocketing or oral holding (bolus hold with thin for several seconds prior to transit) Postural changes: Position pt fully upright for meals Oral care recommendations: Oral care BID (2x/day)      Dustin Olam Bull 07/18/2024,5:15 PM

## 2024-07-19 ENCOUNTER — Encounter (HOSPITAL_COMMUNITY): Payer: Self-pay | Admitting: Physical Medicine & Rehabilitation

## 2024-07-19 ENCOUNTER — Other Ambulatory Visit: Payer: Self-pay

## 2024-07-19 ENCOUNTER — Inpatient Hospital Stay (HOSPITAL_COMMUNITY)
Admission: AD | Admit: 2024-07-19 | DRG: 057 | Disposition: A | Source: Intra-hospital | Attending: Physical Medicine & Rehabilitation | Admitting: Physical Medicine & Rehabilitation

## 2024-07-19 DIAGNOSIS — F32A Depression, unspecified: Secondary | ICD-10-CM | POA: Diagnosis present

## 2024-07-19 DIAGNOSIS — I10 Essential (primary) hypertension: Secondary | ICD-10-CM

## 2024-07-19 DIAGNOSIS — E1169 Type 2 diabetes mellitus with other specified complication: Secondary | ICD-10-CM

## 2024-07-19 DIAGNOSIS — Z794 Long term (current) use of insulin: Secondary | ICD-10-CM | POA: Diagnosis not present

## 2024-07-19 DIAGNOSIS — M25462 Effusion, left knee: Secondary | ICD-10-CM | POA: Diagnosis present

## 2024-07-19 DIAGNOSIS — N390 Urinary tract infection, site not specified: Secondary | ICD-10-CM | POA: Diagnosis present

## 2024-07-19 DIAGNOSIS — R200 Anesthesia of skin: Secondary | ICD-10-CM | POA: Diagnosis present

## 2024-07-19 DIAGNOSIS — I6389 Other cerebral infarction: Secondary | ICD-10-CM | POA: Diagnosis not present

## 2024-07-19 DIAGNOSIS — I69322 Dysarthria following cerebral infarction: Principal | ICD-10-CM

## 2024-07-19 DIAGNOSIS — M25562 Pain in left knee: Secondary | ICD-10-CM | POA: Diagnosis present

## 2024-07-19 DIAGNOSIS — E669 Obesity, unspecified: Secondary | ICD-10-CM | POA: Diagnosis present

## 2024-07-19 DIAGNOSIS — Z79899 Other long term (current) drug therapy: Secondary | ICD-10-CM | POA: Diagnosis not present

## 2024-07-19 DIAGNOSIS — F17210 Nicotine dependence, cigarettes, uncomplicated: Secondary | ICD-10-CM | POA: Diagnosis present

## 2024-07-19 DIAGNOSIS — Z7902 Long term (current) use of antithrombotics/antiplatelets: Secondary | ICD-10-CM

## 2024-07-19 DIAGNOSIS — K5900 Constipation, unspecified: Secondary | ICD-10-CM

## 2024-07-19 DIAGNOSIS — I69351 Hemiplegia and hemiparesis following cerebral infarction affecting right dominant side: Secondary | ICD-10-CM | POA: Diagnosis not present

## 2024-07-19 DIAGNOSIS — Z888 Allergy status to other drugs, medicaments and biological substances status: Secondary | ICD-10-CM | POA: Diagnosis not present

## 2024-07-19 DIAGNOSIS — R3915 Urgency of urination: Secondary | ICD-10-CM | POA: Diagnosis present

## 2024-07-19 DIAGNOSIS — I69392 Facial weakness following cerebral infarction: Secondary | ICD-10-CM | POA: Diagnosis not present

## 2024-07-19 DIAGNOSIS — Z7901 Long term (current) use of anticoagulants: Secondary | ICD-10-CM | POA: Diagnosis not present

## 2024-07-19 DIAGNOSIS — F54 Psychological and behavioral factors associated with disorders or diseases classified elsewhere: Secondary | ICD-10-CM

## 2024-07-19 DIAGNOSIS — D649 Anemia, unspecified: Secondary | ICD-10-CM | POA: Diagnosis present

## 2024-07-19 DIAGNOSIS — Z7982 Long term (current) use of aspirin: Secondary | ICD-10-CM

## 2024-07-19 DIAGNOSIS — I639 Cerebral infarction, unspecified: Secondary | ICD-10-CM | POA: Diagnosis present

## 2024-07-19 DIAGNOSIS — M1712 Unilateral primary osteoarthritis, left knee: Secondary | ICD-10-CM | POA: Diagnosis present

## 2024-07-19 DIAGNOSIS — E119 Type 2 diabetes mellitus without complications: Secondary | ICD-10-CM | POA: Diagnosis not present

## 2024-07-19 DIAGNOSIS — Z6827 Body mass index (BMI) 27.0-27.9, adult: Secondary | ICD-10-CM

## 2024-07-19 DIAGNOSIS — I69320 Aphasia following cerebral infarction: Secondary | ICD-10-CM | POA: Diagnosis not present

## 2024-07-19 DIAGNOSIS — K5909 Other constipation: Secondary | ICD-10-CM | POA: Diagnosis present

## 2024-07-19 DIAGNOSIS — Z7984 Long term (current) use of oral hypoglycemic drugs: Secondary | ICD-10-CM | POA: Diagnosis not present

## 2024-07-19 DIAGNOSIS — R296 Repeated falls: Secondary | ICD-10-CM | POA: Diagnosis present

## 2024-07-19 DIAGNOSIS — I69321 Dysphasia following cerebral infarction: Secondary | ICD-10-CM

## 2024-07-19 DIAGNOSIS — R739 Hyperglycemia, unspecified: Secondary | ICD-10-CM | POA: Diagnosis not present

## 2024-07-19 DIAGNOSIS — E785 Hyperlipidemia, unspecified: Secondary | ICD-10-CM | POA: Diagnosis present

## 2024-07-19 DIAGNOSIS — F17200 Nicotine dependence, unspecified, uncomplicated: Secondary | ICD-10-CM

## 2024-07-19 DIAGNOSIS — K5901 Slow transit constipation: Secondary | ICD-10-CM | POA: Diagnosis not present

## 2024-07-19 LAB — GLUCOSE, CAPILLARY
Glucose-Capillary: 134 mg/dL — ABNORMAL HIGH (ref 70–99)
Glucose-Capillary: 280 mg/dL — ABNORMAL HIGH (ref 70–99)
Glucose-Capillary: 115 mg/dL — ABNORMAL HIGH (ref 70–99)
Glucose-Capillary: 164 mg/dL — ABNORMAL HIGH (ref 70–99)

## 2024-07-19 MED ORDER — METFORMIN HCL ER 500 MG PO TB24
500.0000 mg | ORAL_TABLET | Freq: Every day | ORAL | Status: DC
  Administered 2024-07-20 – 2024-08-01 (×13): 500 mg via ORAL
  Filled 2024-07-19 (×13): qty 1

## 2024-07-19 MED ORDER — DAPAGLIFLOZIN PROPANEDIOL 10 MG PO TABS
10.0000 mg | ORAL_TABLET | Freq: Every day | ORAL | Status: DC
  Administered 2024-07-20 – 2024-08-01 (×13): 10 mg via ORAL
  Filled 2024-07-19 (×13): qty 1

## 2024-07-19 MED ORDER — FAMOTIDINE 20 MG PO TABS
40.0000 mg | ORAL_TABLET | Freq: Every day | ORAL | Status: DC
  Administered 2024-07-20 – 2024-08-01 (×13): 40 mg via ORAL
  Filled 2024-07-19 (×13): qty 2

## 2024-07-19 MED ORDER — LIDOCAINE 5 % EX PTCH: 1.0000 | MEDICATED_PATCH | Freq: Every day | CUTANEOUS | Status: DC

## 2024-07-19 MED ORDER — LOSARTAN POTASSIUM 25 MG PO TABS
25.0000 mg | ORAL_TABLET | Freq: Every day | ORAL | Status: DC
  Administered 2024-07-20 – 2024-08-01 (×13): 25 mg via ORAL
  Filled 2024-07-19 (×13): qty 1

## 2024-07-19 MED ORDER — AMLODIPINE BESYLATE 5 MG PO TABS: 5.0000 mg | ORAL_TABLET | Freq: Every day | ORAL | Status: DC

## 2024-07-19 MED ORDER — LOSARTAN POTASSIUM 25 MG PO TABS: 25.0000 mg | ORAL_TABLET | Freq: Every day | ORAL | Status: DC

## 2024-07-19 MED ORDER — GLUCERNA SHAKE PO LIQD
237.0000 mL | Freq: Three times a day (TID) | ORAL | Status: DC
  Administered 2024-07-19 – 2024-07-20 (×5): 237 mL via ORAL

## 2024-07-19 MED ORDER — SENNOSIDES-DOCUSATE SODIUM 8.6-50 MG PO TABS
1.0000 | ORAL_TABLET | Freq: Two times a day (BID) | ORAL | Status: DC
  Administered 2024-07-19 – 2024-07-31 (×16): 1 via ORAL
  Filled 2024-07-19 (×25): qty 1

## 2024-07-19 MED ORDER — LIDOCAINE 5 % EX PTCH
1.0000 | MEDICATED_PATCH | Freq: Every day | CUTANEOUS | Status: DC
  Administered 2024-07-20 – 2024-08-01 (×13): 1 via TRANSDERMAL
  Filled 2024-07-19 (×13): qty 1

## 2024-07-19 MED ORDER — INSULIN GLARGINE 100 UNIT/ML ~~LOC~~ SOLN
14.0000 [IU] | Freq: Every day | SUBCUTANEOUS | Status: DC
  Administered 2024-07-20 – 2024-08-01 (×13): 14 [IU] via SUBCUTANEOUS
  Filled 2024-07-19 (×13): qty 0.14

## 2024-07-19 MED ORDER — AMLODIPINE BESYLATE 5 MG PO TABS
5.0000 mg | ORAL_TABLET | Freq: Every day | ORAL | Status: DC
  Administered 2024-07-20 – 2024-07-26 (×7): 5 mg via ORAL
  Filled 2024-07-19 (×7): qty 1

## 2024-07-19 MED ORDER — IPRATROPIUM-ALBUTEROL 0.5-2.5 (3) MG/3ML IN SOLN: 3.0000 mL | RESPIRATORY_TRACT | Status: DC | PRN

## 2024-07-19 MED ORDER — SORBITOL 70 % SOLN: 30.0000 mL | Freq: Every day | Status: DC | PRN

## 2024-07-19 MED ORDER — DICLOFENAC SODIUM 1 % EX GEL: 4.0000 g | Freq: Four times a day (QID) | CUTANEOUS | Status: DC

## 2024-07-19 MED ORDER — RIVAROXABAN 10 MG PO TABS: 10.0000 mg | ORAL_TABLET | Freq: Every day | ORAL | Status: DC

## 2024-07-19 MED ORDER — ATORVASTATIN CALCIUM 80 MG PO TABS
80.0000 mg | ORAL_TABLET | Freq: Every day | ORAL | Status: DC
  Administered 2024-07-20 – 2024-08-01 (×13): 80 mg via ORAL
  Filled 2024-07-19 (×13): qty 1

## 2024-07-19 MED ORDER — EZETIMIBE 10 MG PO TABS
10.0000 mg | ORAL_TABLET | Freq: Every day | ORAL | Status: DC
  Administered 2024-07-20 – 2024-08-01 (×13): 10 mg via ORAL
  Filled 2024-07-19 (×13): qty 1

## 2024-07-19 MED ORDER — INSULIN ASPART 100 UNIT/ML IJ SOLN
0.0000 [IU] | Freq: Three times a day (TID) | INTRAMUSCULAR | Status: DC
  Administered 2024-07-20: 2 [IU] via SUBCUTANEOUS
  Administered 2024-07-20: 3 [IU] via SUBCUTANEOUS
  Administered 2024-07-20: 2 [IU] via SUBCUTANEOUS
  Filled 2024-07-19: qty 2
  Filled 2024-07-19: qty 3
  Filled 2024-07-19: qty 2

## 2024-07-19 MED ORDER — ADULT MULTIVITAMIN W/MINERALS CH
1.0000 | ORAL_TABLET | Freq: Every day | ORAL | Status: DC
  Administered 2024-07-20 – 2024-08-01 (×13): 1 via ORAL
  Filled 2024-07-19 (×13): qty 1

## 2024-07-19 MED ORDER — RIVAROXABAN 10 MG PO TABS
10.0000 mg | ORAL_TABLET | Freq: Every day | ORAL | Status: DC
  Administered 2024-07-20 – 2024-07-31 (×12): 10 mg via ORAL
  Filled 2024-07-19 (×12): qty 1

## 2024-07-19 MED ORDER — NICOTINE 21 MG/24HR TD PT24: 21.0000 mg | MEDICATED_PATCH | Freq: Every day | TRANSDERMAL | Status: DC

## 2024-07-19 MED ORDER — POLYETHYLENE GLYCOL 3350 17 G PO PACK: 17.0000 g | PACK | Freq: Two times a day (BID) | ORAL | Status: DC

## 2024-07-19 MED ORDER — CLOPIDOGREL BISULFATE 75 MG PO TABS: 75.0000 mg | ORAL_TABLET | Freq: Every day | ORAL | Status: DC

## 2024-07-19 MED ORDER — INSULIN ASPART 100 UNIT/ML IJ SOLN
3.0000 [IU] | Freq: Three times a day (TID) | INTRAMUSCULAR | Status: DC
  Administered 2024-07-20 – 2024-07-31 (×18): 3 [IU] via SUBCUTANEOUS
  Filled 2024-07-19 (×26): qty 3

## 2024-07-19 MED ORDER — POLYETHYLENE GLYCOL 3350 17 G PO PACK: 17.0000 g | PACK | Freq: Every day | ORAL | Status: DC

## 2024-07-19 MED ORDER — POLYETHYLENE GLYCOL 3350 17 G PO PACK
17.0000 g | PACK | Freq: Two times a day (BID) | ORAL | Status: DC
  Administered 2024-07-19 – 2024-07-30 (×16): 17 g via ORAL
  Filled 2024-07-19 (×26): qty 1

## 2024-07-19 MED ORDER — NICOTINE 21 MG/24HR TD PT24
21.0000 mg | MEDICATED_PATCH | Freq: Every day | TRANSDERMAL | Status: DC
  Administered 2024-07-20 – 2024-07-27 (×7): 21 mg via TRANSDERMAL
  Filled 2024-07-19 (×13): qty 1

## 2024-07-19 MED ORDER — ACETAMINOPHEN 500 MG PO TABS: 500.0000 mg | ORAL_TABLET | Freq: Four times a day (QID) | ORAL | 0 refills | Status: AC | PRN

## 2024-07-19 MED ORDER — ACETAMINOPHEN 500 MG PO TABS
500.0000 mg | ORAL_TABLET | Freq: Four times a day (QID) | ORAL | Status: DC | PRN
  Administered 2024-07-21 – 2024-07-30 (×4): 500 mg via ORAL
  Filled 2024-07-19 (×5): qty 1

## 2024-07-19 MED ORDER — FOLIC ACID 1 MG PO TABS
1.0000 mg | ORAL_TABLET | Freq: Every day | ORAL | Status: DC
  Administered 2024-07-20 – 2024-08-01 (×13): 1 mg via ORAL
  Filled 2024-07-19 (×13): qty 1

## 2024-07-19 MED ORDER — CLOPIDOGREL BISULFATE 75 MG PO TABS
75.0000 mg | ORAL_TABLET | Freq: Every day | ORAL | Status: DC
  Administered 2024-07-20: 75 mg via ORAL
  Filled 2024-07-19: qty 1

## 2024-07-19 MED ORDER — THIAMINE MONONITRATE 100 MG PO TABS
100.0000 mg | ORAL_TABLET | Freq: Every day | ORAL | Status: DC
  Administered 2024-07-20 – 2024-08-01 (×13): 100 mg via ORAL
  Filled 2024-07-19 (×13): qty 1

## 2024-07-19 MED ORDER — THIAMINE HCL 100 MG/ML IJ SOLN
100.0000 mg | Freq: Every day | INTRAMUSCULAR | Status: DC
  Filled 2024-07-19 (×13): qty 1

## 2024-07-19 MED ORDER — ASPIRIN 81 MG PO CHEW
81.0000 mg | CHEWABLE_TABLET | Freq: Every day | ORAL | Status: AC
  Administered 2024-07-20 – 2024-07-30 (×11): 81 mg via ORAL
  Filled 2024-07-19 (×11): qty 1

## 2024-07-19 MED ORDER — DICLOFENAC SODIUM 1 % EX GEL
2.0000 g | Freq: Four times a day (QID) | CUTANEOUS | Status: DC
  Administered 2024-07-19 – 2024-07-31 (×38): 2 g via TOPICAL
  Filled 2024-07-19: qty 100

## 2024-07-19 NOTE — Progress Notes (Signed)
 Inpatient Rehabilitation  Patient information reviewed and entered into eRehab system by [facility]. Karen Kays., CCC/SLP, PPS Coordinator.  Information including medical coding, functional ability and quality indicators will be reviewed and updated through discharge.

## 2024-07-19 NOTE — Discharge Summary (Signed)
 Name: Darlene Pugh MRN: [id number] DOB: January 20, 1961 63 y.o. PCP: Earvin Johnston PARAS, FNP  Date of Admission: 07/10/2024 10:12 AM Date of Discharge: 07/19/2024 Attending Physician: Dr. Mliss Pouch  Discharge Diagnosis: 1. Principal Problem:   Acute CVA (cerebrovascular accident) (HCC) Active Problems:   Hypokalemia   T2DM (type 2 diabetes mellitus) (HCC)   HTN (hypertension)   Tobacco use disorder   Alcohol use disorder   Hyperlipidemia   Hemiparesis of right dominant side due to acute cerebrovascular disease (HCC)   Dysphagia due to recent cerebrovascular accident (CVA)   Adjustment reaction with anxiety and depression   Dysarthria due to acute cerebrovascular accident (CVA) (HCC)   Cough, unspecified; aspiration (related to CVA) suspected, no pneumonia   Effusion of left knee joint after fall   Left knee pain   Discharge Medications: Allergies as of 07/19/2024       Reactions   Neurontin [gabapentin] Other (See Comments)   Myalgias         Medication List     STOP taking these medications    lisinopril  40 MG tablet Commonly known as: ZESTRIL    meloxicam  7.5 MG tablet Commonly known as: MOBIC    metoprolol  succinate 100 MG 24 hr tablet Commonly known as: TOPROL -XL   QUEtiapine 50 MG Tb24 24 hr tablet Commonly known as: SEROQUEL XR   ticagrelor  90 MG Tabs tablet Commonly known as: BRILINTA    Ventolin  HFA 108 (90 Base) MCG/ACT inhaler Generic drug: albuterol    Vitamin D (Ergocalciferol) 1.25 MG (50000 UNIT) Caps capsule Commonly known as: DRISDOL       TAKE these medications    acetaminophen  500 MG tablet Commonly known as: TYLENOL  Take 1 tablet (500 mg total) by mouth every 6 (six) hours as needed for mild pain (pain score 1-3), fever or headache.   amLODipine  5 MG tablet Commonly known as: NORVASC  Take 1 tablet (5 mg total) by mouth daily. Start taking on: July 20, 2024   aspirin  EC 81 MG tablet Take 1 tablet (81 mg total) by  mouth daily.   atorvastatin  80 MG tablet Commonly known as: LIPITOR  Take 1 tablet (80 mg total) by mouth daily at 6 PM.   clopidogrel  75 MG tablet Commonly known as: PLAVIX  Take 1 tablet (75 mg total) by mouth daily for 21 days.   diclofenac  Sodium 1 % Gel Commonly known as: VOLTAREN  Apply 4 g topically 4 (four) times daily.   ezetimibe  10 MG tablet Commonly known as: ZETIA  Take 10 mg by mouth daily.   Farxiga  10 MG Tabs tablet Generic drug: dapagliflozin  propanediol Take 10 mg by mouth daily.   lidocaine  5 % Commonly known as: LIDODERM  Place 1 patch onto the skin daily. Remove & Discard patch within 12 hours or as directed by MD Start taking on: July 20, 2024 What changed:  when to take this reasons to take this additional instructions   losartan  25 MG tablet Commonly known as: COZAAR  Take 1 tablet (25 mg total) by mouth daily. Start taking on: July 20, 2024   metFORMIN  500 MG tablet Commonly known as: GLUCOPHAGE  Take 1 tablet (500 mg total) by mouth daily with breakfast.   nicotine  21 mg/24hr patch Commonly known as: NICODERM CQ  - dosed in mg/24 hours Place 1 patch (21 mg total) onto the skin daily. Start taking on: July 20, 2024   nitroGLYCERIN  0.4 MG SL tablet Commonly known as: NITROSTAT  Place 1 tablet (0.4 mg total) under the tongue every 5 (five) minutes x  3 doses as needed for chest pain.   pantoprazole  40 MG tablet Commonly known as: PROTONIX  Take 1 tablet (40 mg total) by mouth daily.   rivaroxaban  10 MG Tabs tablet Commonly known as: XARELTO  Take 1 tablet (10 mg total) by mouth daily. Start taking on: July 20, 2024        Disposition and follow-up:   [name] was discharged from [facility] in Stable condition.  At the hospital follow up visit please address:  Adherence to medications, HTN, Stroke risk factors The patient reports that when she was admitted she was not adherent to her  antihypertensives, Lipitor , and continued to smoke heavily.  We did have multiple discussions with her and her family this admission about the importance of medication adherence and optimizing risk factors for strokes as this is likely the reason the patient had a stroke in the first place.  She did verbalize understanding but please follow-up that the patient remains adherent to medications as she was severely hypertensive upon admission.      Hospital Course by problem list: Darlene Pugh is a 63 y.o. person living with a history of  type 2 diabetes, hypertension, osteoarthritis, vitamin D deficiency who presented with right-sided weakness after being found down at home and admitted for CVA now being discharged on hospital day 8 with the following pertinent hospital course:  Acute left internal capsule/basal ganglia stroke The patient was admitted on 12/1 for an acute left internal capsule/basal ganglia stroke after being found down at her house.  She did have right hemiparesis, dysarthria, and dysphagia upon presentation which persisted throughout admission.  On the night of 12/1 her blood pressure did drop to 90s over 60s without any antihypertensives given.  The next morning her stroke symptoms did appear to be slightly worse.  She was given a fluid bolus with recovery of appropriate blood pressure.  She was evaluated by obstructing who placed her on aspirin  and Plavix  as well as restart her Lipitor .  Over the course admission she was seen by PT/OT who recommended inpatient rehab.  SLP did see her for her dysphagia and initially recommended a dysphagia diet with nectar thick liquids.  Over the coming days she did begin to improve and swallowing function recovered.  Due to dysphagia diet, she was given a liter of fluids daily. She was transitioned back to a regular diet on 12/9. Through her admission, she made substantial gains with ambulation, speech, and swallowing.  UTI Urinary  Retention The patient was noted to be retaining urine early in admission and did require multiple straight caths.  She was frequently noted to have very concentrated urine as her p.o. intake was very poor.  It was determined that most of hypertension was behavioral as she was not used to having to be helped to use the restroom.  This did resolve over time.  On 12/5 urinalysis was obtained which was positive for UTI.  She was treated with 3 days of Rocephin  with resolution of symptoms.  Left Knee Pain The patient did complain of left knee pain, likely from her fall prior to admission.  Upon evaluation there was a moderate effusion which was confirmed with x-ray.  This was treated with conservative management including lidocaine  patches Voltaren  gel and Ace bandage.  Over the coming days her pain did significantly improve and the effusion reduced.  Subjective Did the patient was seen and examined at the bedside this morning she was feeling very well and  excited to go to rehab.  She is very proud of herself of the gains that she made (as she should be).  All questions and concerns were addressed at this time.  Discharge Exam:   BP 105/65 (BP Location: Left Arm)   Pulse (!) 104   Temp 98.1 F (36.7 C) (Oral)   Resp 18   Ht 5' 4 (1.626 m)   Wt 76.2 kg   SpO2 98%   BMI 28.84 kg/m  Const: Awake, alert in NAD HENT: Normocephalic, atraumatic, mucus membranes moist Card: RRR, No MRG, No pitting edema on LE's bilaterally  Resp: LCTAB, no increased work of breathing Abd: Soft, NTND Extremities: Warm, dry, left knee with lidocaine  patch over anterior surface.  No effusion noted Neuro: Right upper and lower extremity weakness noted, improved from admission.  She continues to have a notable right facial droop however dysarthria has significantly improved.   Pertinent Labs, Studies, and Procedures:     Latest Ref Rng & Units 07/17/2024    9:00 AM 07/14/2024    5:28 AM 07/12/2024    1:50 AM  CBC   WBC 4.0 - 10.5 K/uL 7.8  7.7  8.3   Hemoglobin 12.0 - 15.0 g/dL 84.5  86.5  85.4   Hematocrit 36.0 - 46.0 % 46.1  39.3  43.9   Platelets 150 - 400 K/uL 370  256  261        Latest Ref Rng & Units 07/17/2024    9:00 AM 07/14/2024    5:28 AM 07/13/2024    7:22 AM  CMP  Glucose 70 - 99 mg/dL 790  868  849   BUN 8 - 23 mg/dL 10  10  6    Creatinine 0.44 - 1.00 mg/dL 9.27  9.18  9.47   Sodium 135 - 145 mmol/L 141  139  137   Potassium 3.5 - 5.1 mmol/L 3.9  3.4  3.8   Chloride 98 - 111 mmol/L 99  97  102   CO2 22 - 32 mmol/L 30  29  24    Calcium  8.9 - 10.3 mg/dL 9.8  8.8  8.2   Total Protein 6.5 - 8.1 g/dL  6.1    Total Bilirubin 0.0 - 1.2 mg/dL  2.6    Alkaline Phos 38 - 126 U/L  74    AST 15 - 41 U/L  12    ALT 0 - 44 U/L  13      No results found.   Discharge Instructions: Discharge Instructions     Call MD for:  persistant dizziness or light-headedness   Complete by: As directed    Call MD for:  redness, tenderness, or signs of infection (pain, swelling, redness, odor or green/yellow discharge around incision site)   Complete by: As directed    Call MD for:  severe uncontrolled pain   Complete by: As directed    Call MD for:  temperature >100.4   Complete by: As directed    Diet - low sodium heart healthy   Complete by: As directed    Discharge instructions   Complete by: As directed    Thank you for allowing us  to be part of your care. You were hospitalized for a stroke and HTN. We treated you with Aspirin , Plavix , Amlodipine , and losartan .   You have improved so much and we are SO proud of you!  See the changes in your medications and management of your chronic conditions below:  *For your Stroke -We  have STARTED you on these following medications:  -Aspirin  81mg --daily  -Plavix  (75mg )--Take until December 22nd  -We have STOPPED the following medications:  -Brilinta   *For your Blood pressure -We have STARTED you on these following medications:  -Losartan   25mg   -Amlodipine  5mg   -We have STOPPED the following medications:  -Lisinopril  5mg   -Metoprolol  100mg    -Please see your primary care doctor after your are discharged from your rehab facility.  You are being transferred to inpatient rehab.   Please make sure to take your medications as prescribed  Please call your PCP or our clinic if you have any questions or concerns, we may be able to help and keep you from a long and expensive emergency room wait. Our clinic and after hours phone number is [phone]. The best time to call is Monday through Friday 9 am to 4 pm but there is always someone available 24/7 if you have an emergency. If you need medication refills please notify your pharmacy one week in advance and they will send us  a request.   We are glad you are feeling better,  Schuyler Novak, DO Internal Medicine Inpatient Teaching Service at [facility]   Increase activity slowly   Complete by: As directed        Signed: Novak Schuyler, DO 07/19/2024, 5:01 PM

## 2024-07-19 NOTE — TOC Transition Note (Signed)
 Transition of Care [facility]) - Discharge Note   Patient Details  Name: Darlene Pugh MRN: [id number] Date of Birth: 09/04/1960  [facility] CM/SW Contact:  Andrez JULIANNA George, RN Phone Number: 07/19/2024, 10:33 AM   Clinical Narrative:     Pt is discharging to CIR today. No further needs per CM.    Final next level of care: IP Rehab Facility Barriers to Discharge: No Barriers Identified   Patient Goals and CMS Choice   CMS Medicare.gov Compare Post Acute Care list provided to:: Patient Choice offered to / list presented to : Patient      Discharge Placement                       Discharge Plan and Services Additional resources added to the After Visit Summary for                                       Social Drivers of Health (SDOH) Interventions SDOH Screenings   Food Insecurity: No Food Insecurity (07/10/2024)  Housing: Low Risk  (07/10/2024)  Transportation Needs: Unmet Transportation Needs (07/10/2024)  Utilities: Not At Risk (07/10/2024)  Financial Resource Strain: Not on File (11/27/2021)   Received from [facility]  Physical Activity: Not on File (11/27/2021)   Received from [facility]  Social Connections: Not on File (05/03/2023)   Received from [facility]  Stress: Not on File (11/27/2021)   Received from [facility]  Tobacco Use: High Risk (07/11/2024)     Readmission Risk Interventions     No data to display

## 2024-07-19 NOTE — Discharge Instructions (Addendum)
 Thank you for allowing us  to be part of your care. You were hospitalized for a stroke and HTN. We treated you with Aspirin , Plavix , Amlodipine , and losartan .   You have improved so much and we are SO proud of you!  See the changes in your medications and management of your chronic conditions below:  *For your Stroke -We have STARTED you on these following medications:  -Aspirin  81mg --daily  -Plavix  (75mg )--Take until December 22nd  -We have STOPPED the following medications:  -Brilinta   *For your Blood pressure -We have STARTED you on these following medications:  -Losartan  25mg   -Amlodipine  5mg   -We have STOPPED the following medications:  -Lisinopril  5mg   -Metoprolol  100mg    -Please see your primary care doctor after your are discharged from your rehab facility.  You are being transferred to inpatient rehab.   Please make sure to take your medications as prescribed  Please call your PCP or our clinic if you have any questions or concerns, we may be able to help and keep you from a long and expensive emergency room wait. Our clinic and after hours phone number is [phone]. The best time to call is Monday through Friday 9 am to 4 pm but there is always someone available 24/7 if you have an emergency. If you need medication refills please notify your pharmacy one week in advance and they will send us  a request.   We are glad you are feeling better,  Schuyler Novak, DO Internal Medicine Inpatient Teaching Service at [facility]

## 2024-07-19 NOTE — Progress Notes (Signed)
 Speech Language Pathology Treatment: Dysphagia;Cognitive-Linguistic  Patient Details Name: Darlene Pugh MRN: [id number] DOB: 12-23-1960 Today's Date: 07/19/2024 Time: 9165-9149 SLP Time Calculation (min) (ACUTE ONLY): 16 min  Assessment / Plan / Recommendation Clinical Impression  Treatment focused on dysphagia after MBS yesterday incorporating cognition and brief dysarthria intervention. Pt agreeable to donn dentures and explained reasoning. Given extra time and self correction she recalled strategy of oral hold with liquids prior to transiting bolus. Consumed straw sips thin initially requiring mild-mod cues to demonstrate hold fading to min-supervision with continued practice. There were no s/s aspiration during session and masticated solid cracker timely without residue and accurately recalled checking right side for potential pocketing.  Introduced speech strategies to improve intelligibility and will continue to work on in therapy. She independently recalled possible disposition plan to inpatient rehab. Continue regular, thin liquid with bolus hold, pills whole in puree.    HPI HPI: Darlene Pugh is a 63 year old female admitted after fall. MRI acute infarct involving the left internal capsule/basal ganglia, chronic small vessel ischemic disease with multiple chronic lacunar infarcts. CXR no acute cardiopulmonary abnormality. PMH:   diabetes, hypertension, vitamin D deficiency, osteoarthritis.Repeat MBS for possible liquid/texture upgrade.      SLP Plan  Continue with current plan of care        Swallow Evaluation Recommendations   Recommendations: PO diet PO Diet Recommendation: Regular;Thin liquids (Level 0) Liquid Administration via: Straw Medication Administration: Whole meds with puree Supervision: Patient able to self-feed;Full supervision/cueing for swallowing strategies (initially) Postural changes: Position pt fully upright for meals Oral care  recommendations: Oral care BID (2x/day)     Recommendations                   Rehab consult Oral care BID   Frequent or constant Supervision/Assistance Dysphagia, unspecified (R13.10);Dysarthria and anarthria (R47.1);Cognitive communication deficit (M58.158)     Continue with current plan of care     Darlene Pugh  07/19/2024, 8:57 AM

## 2024-07-19 NOTE — Progress Notes (Signed)
° °  Inpatient Rehabilitation Admissions Coordinator   I have received insurance approval and have CIR bed to admit him to today. I contacted her daughter, Sharene and she is in agreement. Acute team and TOC made aware. I will make the arrangements.  Heron Leavell, RN, MSN Rehab Admissions Coordinator [phone] 07/19/2024 9:55 AM

## 2024-07-19 NOTE — H&P (Signed)
 Physical Medicine and Rehabilitation Admission H&P    Chief Complaint  Patient presents with   Code Stroke  : HPI: Darlene Pugh is a 63 year old right-handed female with history significant for depression, hyperlipidemia, alcohol use, hypertension, diabetes mellitus as well as tobacco use as well as history of CVA with residual left facial numbness maintained on aspirin  as well as Brilinta .  Per chart review patient lives alone.  Independent prior to admission and driving.  1 level home 4 steps to entry.  Presented 07/10/2024 after being found down on the ground by her property manager with facial droop dysarthria and weakness.  She endorses 2 recent falls without loss of consciousness.  Blood pressure on admission 160/99.  CTA showed no evidence of emergent large vessel occlusion.  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  MRI showed acute infarct involving the left internal capsule/basal ganglia.  Patient did not receive TNK.  LVO negative.  Admission chemistries unremarkable except potassium 3.4 glucose 218, alcohol negative, total CK of 172, hemoglobin A1c 9.5.  Echocardiogram ejection fraction of 55 to 60% no wall motion abnormalities.  Neurology follow-up currently maintained on aspirin  81 mg daily and Plavix  75 mg daily for 3 weeks then Plavix  alone.  She has been placed on Xarelto  10 mg daily for 30 days for VTE prophylaxis.  She did have a urine culture greater than 100,000 colonies yeast and completed 3-day course of Rocephin .  Patient reports she had left knee pain due to the fall before admission.  Patient reports this has been gradually improving.  Appears she was treated with Ace wrap's, ice packs and Toradol .  She had an x-ray with mild lateral compartmental degeneration arthritis and moderate joint effusion.  Her diet has been advanced to a regular consistency.  Therapy  evaluations completed due to patient's decreased functional mobility was admitted for a comprehensive rehab program.  Review of Systems  Constitutional:  Negative for chills and fever.  HENT:  Negative for hearing loss.   Eyes:  Negative for blurred vision and double vision.  Respiratory:  Negative for cough, shortness of breath and wheezing.   Cardiovascular:  Positive for leg swelling. Negative for chest pain and palpitations.  Gastrointestinal:  Positive for constipation. Negative for abdominal pain, heartburn, nausea and vomiting.  Genitourinary:  Positive for frequency. Negative for dysuria, flank pain and hematuria.       Incontinence  Musculoskeletal:  Positive for falls, joint pain and myalgias.  Skin:  Negative for rash.  Neurological:  Positive for sensory change, speech change and weakness. Negative for headaches.  Psychiatric/Behavioral:  The patient does not have insomnia.   All other systems reviewed and are negative.  Past Medical History:  Diagnosis Date   Diabetes mellitus without complication (HCC)    Hypertension    Past Surgical History:  Procedure Laterality Date   LEFT HEART CATH AND CORONARY ANGIOGRAPHY N/A 08/02/2017   Procedure: LEFT HEART CATH AND CORONARY ANGIOGRAPHY;  Surgeon: Levern Hutching, MD;  Location: [facility];  Service: Cardiovascular;  Laterality: N/A;   History reviewed. No pertinent family history. Social History:  reports that she has been smoking cigarettes. She has never used smokeless tobacco. She reports that she does not drink alcohol and does not use drugs. Allergies:  Allergies  Allergen Reactions   Gabapentin     Other Reaction(s): Intolerance - Will Not Trigger Allergy Alert  Severe muscle pain   Medications Prior to Admission  Medication Sig Dispense Refill   aspirin  EC 81 MG EC tablet Take 1 tablet (81 mg total) by mouth daily. 30 tablet 3   atorvastatin  (LIPITOR ) 80 MG tablet Take 1 tablet (80 mg total) by mouth  daily at 6 PM. 30 tablet 3   ezetimibe  (ZETIA ) 10 MG tablet Take 10 mg by mouth daily.     FARXIGA  10 MG TABS tablet Take 10 mg by mouth daily.     lidocaine  (LIDODERM ) 5 % Place 1 patch onto the skin daily as needed (pain).     lisinopril  (ZESTRIL ) 40 MG tablet Take 40 mg by mouth 2 (two) times daily.     meloxicam  (MOBIC ) 7.5 MG tablet Take 7.5 mg by mouth daily as needed for pain.     metFORMIN  (GLUCOPHAGE ) 500 MG tablet Take 1 tablet (500 mg total) by mouth daily with breakfast. 30 tablet 0   metoprolol  succinate (TOPROL -XL) 100 MG 24 hr tablet Take 100 mg by mouth daily.     nitroGLYCERIN  (NITROSTAT ) 0.4 MG SL tablet Place 1 tablet (0.4 mg total) under the tongue every 5 (five) minutes x 3 doses as needed for chest pain. 25 tablet 12   pantoprazole  (PROTONIX ) 40 MG tablet Take 1 tablet (40 mg total) by mouth daily. 30 tablet 3   QUEtiapine (SEROQUEL XR) 50 MG TB24 24 hr tablet Take 50 mg by mouth at bedtime.     ticagrelor  (BRILINTA ) 90 MG TABS tablet Take 1 tablet (90 mg total) by mouth 2 (two) times daily. 60 tablet 3   VENTOLIN  HFA 108 (90 Base) MCG/ACT inhaler Inhale 2 puffs into the lungs every 6 (six) hours as needed.     Vitamin D, Ergocalciferol, (DRISDOL) 50000 units CAPS capsule Take 50,000 Units by mouth every 7 (seven) days.        Home: Home Living Family/patient expects to be discharged to:: Private residence Living Arrangements: Alone Available Help at Discharge: Family, Available PRN/intermittently (dtr) Type of Home: Apartment Home Access: Stairs to enter Entrance Stairs-Number of Steps: 4 Entrance Stairs-Rails: Right, Left, Can reach both Home Layout: One level Bathroom Shower/Tub: Engineer, Manufacturing Systems: Standard Bathroom Accessibility: Yes Home Equipment: Information systems manager, Standard Environmental Consultant, Medical Laboratory Scientific Officer - single point  Lives With: Alone   Functional History: Prior Function Prior Level of Function : Independent/Modified Independent, Driving (2 recent  falls) Mobility Comments: standard walker vs no DME; pt reports a limp ADLs Comments: indep in ADL, IADL, driving, grocery shopping  Functional Status:  Mobility: Bed Mobility Overal bed mobility: Needs Assistance Bed Mobility: Sit to Supine, Supine to Sit Supine to sit: Contact guard, Used rails, HOB elevated Sit to supine: Contact guard assist, Used rails, HOB elevated General bed mobility comments: CGA for safety. Pt used rails Transfers Overall transfer level: Needs assistance Equipment used: Rolling walker (2 wheels) Transfers: Sit to/from Stand Sit to Stand: Contact guard assist Bed to/from chair/wheelchair/BSC transfer type:: Via Lift equipment Step pivot transfers: +2 physical assistance, Mod assist Transfer via Lift Equipment: Stedy General transfer comment: Pt stood on her own today with no physical assistance. Ambulation/Gait Ambulation/Gait assistance: +2 physical assistance, +2 safety/equipment, Min assist, Mod assist, Max assist Gait Distance (Feet): 15 Feet Assistive device: Rolling walker (2 wheels) Gait Pattern/deviations: Knees buckling, Trunk flexed, Wide base of support, Ataxic, Decreased stride length, Step-through pattern General Gait Details: Pt today able to ambulate to door and back with RW. 1 seated rest break. Once at door  pt noted with significant knee buckling requiring +2 Mod/Max to correct. Mostly +2 Min A otherwise. Would benefit from close chair follow next session. Gait velocity: decreased    ADL: ADL Overall ADL's : Needs assistance/impaired Eating/Feeding: Minimal assistance, Sitting Eating/Feeding Details (indicate cue type and reason): assistance with opening containers Grooming: Wash/dry hands, Wash/dry face, Oral care, Supervision/safety, Sitting Grooming Details (indicate cue type and reason): able to use RUE to hold toothbrush when applying toothpaste Upper Body Bathing: Moderate assistance, Sitting Lower Body Bathing: Maximal  assistance, Sit to/from stand Lower Body Bathing Details (indicate cue type and reason): stood in [place] for peri area bathing Upper Body Dressing : Moderate assistance, Sitting Lower Body Dressing: Maximal assistance, Sitting/lateral leans, Sit to/from stand Lower Body Dressing Details (indicate cue type and reason): to donn socks Toilet Transfer: Moderate assistance, +2 for physical assistance, +2 for safety/equipment, Stand-pivot Functional mobility during ADLs: Moderate assistance, +2 for safety/equipment, +2 for physical assistance General ADL Comments: increased RUE use with gross assist with self care  Cognition: Cognition Overall Cognitive Status: Impaired/Different from baseline Arousal/Alertness: Awake/alert Orientation Level: Oriented X4 Year: 2025 Month: December Day of Week: Correct Attention: Sustained Sustained Attention: Appears intact Memory: Impaired Memory Impairment:  (0/4 recall, severe impairment) Awareness: Impaired Awareness Impairment: Intellectual impairment, Emergent impairment Problem Solving:  (functional for verbal tasks) Safety/Judgment: Other (comment) (needs supervision initially) Cognition Arousal: Alert Behavior During Therapy: Flat affect, WFL for tasks assessed/performed, Impulsive Overall Cognitive Status: Impaired/Different from baseline  Physical Exam: Blood pressure 110/75, pulse 88, temperature 98.2 F (36.8 C), temperature source Oral, resp. rate 18, height 5' 4 (1.626 m), weight 76.2 kg, SpO2 95%.  General: No apparent distress HEENT: Head is normocephalic, atraumatic, sclera anicteric, oral mucosa pink and moist, dentures  Neck: Supple without JVD or lymphadenopathy Heart: Reg rate and rhythm. No murmurs rubs or gallops Chest: CTA bilaterally without wheezes, rales, or rhonchi; no distress Abdomen: Soft, non-tender, non-distended, bowel sounds positive. Extremities: No clubbing, cyanosis, or edema. Pulses are 2+ Psych: Pt's affect  is appropriate. Pt is cooperative Skin: Clean and intact without signs of breakdown Neuro:    Mental Status: AAOx year and month, person, place,  Speech/Languate: Naming and repetition intact, Word finding deficits and mild expressive aphasia, dysarthria, follows simple commands CRANIAL NERVES: II: PERRL. Visual fields full III, IV, VI: EOM intact, no gaze preference or deviation V: normal sensation bilaterally VII: R facial weakness VIII: normal hearing to speech IX, X: normal palatal elevation XI: 5/5 head turn and 5/5 shoulder shrug bilaterally XII: Tongue midline   MOTOR: RUE: 4-/5 Deltoid, 4/5 Biceps, 4/5 Triceps,4/5 Grip LUE: 4+/5 Deltoid, 4+/5 Biceps, 4+/5 Triceps, 4+/5 Grip RLE: HF 4-/5, KE 4/5, ADF 4/5, APF 4/5 LLE: HF 4/5, KE 4+/5, ADF 4+/5, APF 4+/5   REFLEXES: Normal and symmetric, no Hoffman's, no clonus  SENSORY: Decreased RUE and RLE  Coordination: Slow finger-nose on right side, appears in proportional weakness  MSK: mild L knee effusion, minimal tenderness or pain with ROM, lidocaine  patch on anterior knee   Results for orders placed or performed during the hospital encounter of 07/10/24 (from the past 48 hours)  Glucose, capillary     Status: Abnormal   Collection Time: 07/17/24 11:14 AM  Result Value Ref Range   Glucose-Capillary 335 (H) 70 - 99 mg/dL    Comment: Glucose reference range applies only to samples taken after fasting for at least 8 hours.   Comment 1 Notify RN   Glucose, capillary  Status: Abnormal   Collection Time: 07/17/24  4:35 PM  Result Value Ref Range   Glucose-Capillary 238 (H) 70 - 99 mg/dL    Comment: Glucose reference range applies only to samples taken after fasting for at least 8 hours.   Comment 1 Notify RN   Glucose, capillary     Status: Abnormal   Collection Time: 07/17/24  9:08 PM  Result Value Ref Range   Glucose-Capillary 333 (H) 70 - 99 mg/dL    Comment: Glucose reference range applies only to samples taken  after fasting for at least 8 hours.  Glucose, capillary     Status: Abnormal   Collection Time: 07/18/24  6:15 AM  Result Value Ref Range   Glucose-Capillary 196 (H) 70 - 99 mg/dL    Comment: Glucose reference range applies only to samples taken after fasting for at least 8 hours.  Glucose, capillary     Status: Abnormal   Collection Time: 07/18/24 11:23 AM  Result Value Ref Range   Glucose-Capillary 171 (H) 70 - 99 mg/dL    Comment: Glucose reference range applies only to samples taken after fasting for at least 8 hours.  Glucose, capillary     Status: Abnormal   Collection Time: 07/18/24  1:45 PM  Result Value Ref Range   Glucose-Capillary 140 (H) 70 - 99 mg/dL    Comment: Glucose reference range applies only to samples taken after fasting for at least 8 hours.  Glucose, capillary     Status: Abnormal   Collection Time: 07/18/24  5:08 PM  Result Value Ref Range   Glucose-Capillary 190 (H) 70 - 99 mg/dL    Comment: Glucose reference range applies only to samples taken after fasting for at least 8 hours.   Comment 1 Document in Chart   Glucose, capillary     Status: Abnormal   Collection Time: 07/18/24  9:06 PM  Result Value Ref Range   Glucose-Capillary 205 (H) 70 - 99 mg/dL    Comment: Glucose reference range applies only to samples taken after fasting for at least 8 hours.   Comment 1 Notify RN    Comment 2 Document in Chart   Glucose, capillary     Status: Abnormal   Collection Time: 07/19/24  6:08 AM  Result Value Ref Range   Glucose-Capillary 134 (H) 70 - 99 mg/dL    Comment: Glucose reference range applies only to samples taken after fasting for at least 8 hours.   Comment 1 Notify RN    Comment 2 Document in Chart    DG Swallowing Func-Speech Pathology Result Date: 07/18/2024 Table formatting from the original result was not included. Modified Barium Swallow Study Patient Details Name: Kaleya Douse MRN: [id number] Date of Birth: September 13, 1960 Today's Date:  07/18/2024 HPI/PMH: HPI: Della Lawanda Gregory is a 63 year old female admitted after fall. MRI acute infarct involving the left internal capsule/basal ganglia, chronic small vessel ischemic disease with multiple chronic lacunar infarcts. CXR no acute cardiopulmonary abnormality. PMH:   diabetes, hypertension, vitamin D deficiency, osteoarthritis.Repeat MBS for possible liquid/texture upgrade. Clinical Impression: Clinical Impression: Pt demonstrated improved swallow function from prior MBS. There was mild decreased lingual control with intermittent premature spill to valleculae and significantly less oral residue from previous study. Laryngeal strength and mobility are adequate with pt demonstrating decreased pharyngeal timing with barium filling  pyriforms resulting in one episode of aspiration, clearing with strong reflexive cough (PAS 6). Penetration resulted from premature spill with thin into vestibule once  but was ejected during the swallow (PAS 2). A cue to orally hold smaller bolus for several seconds prior to transit resulted in only intermittent trace spill to valleculae with laryngeal protection during the swallow. She was able to consistently perform strategy over multiple trials. Trace barium ascended from PES x 1 and esophageal scan revealed mild retention. Mastication mildly slow without residue during or study or when observed with additional trial cracker after study completed. Recommend regular texture (dentures donned), small sips with brief bolus hold, pills whole in puree and check right side oral cavity for potential residue. Factors that may increase risk of adverse event in presence of aspiration Noe & Lianne 2021): Factors that may increase risk of adverse event in presence of aspiration Noe & Lianne 2021): Reduced cognitive function Recommendations/Plan: Swallowing Evaluation Recommendations Swallowing Evaluation Recommendations Recommendations: PO diet PO Diet Recommendation:  Regular; Thin liquids (Level 0) Liquid Administration via: Cup; Straw Medication Administration: Whole meds with puree Supervision: Patient able to self-feed; Full supervision/cueing for swallowing strategies (initially) Swallowing strategies  : Slow rate; Small bites/sips; Check for pocketing or oral holding (bolus hold with thin for several seconds prior to transit) Postural changes: Position pt fully upright for meals Oral care recommendations: Oral care BID (2x/day) Treatment Plan Treatment Plan Treatment recommendations: Therapy as outlined in treatment plan below Follow-up recommendations: Acute inpatient rehab (3 hours/day) Functional status assessment: Patient has had a recent decline in their functional status and demonstrates the ability to make significant improvements in function in a reasonable and predictable amount of time. Treatment frequency: Min 2x/week Treatment duration: 2 weeks Interventions: Patient/family education; Compensatory techniques; Diet toleration management by SLP Recommendations Recommendations for follow up therapy are one component of a multi-disciplinary discharge planning process, led by the attending physician.  Recommendations may be updated based on patient status, additional functional criteria and insurance authorization. Assessment: Orofacial Exam: Orofacial Exam Oral Cavity: Oral Hygiene: WFL Oral Cavity - Dentition: Dentures, top; Dentures, bottom Orofacial Anatomy: Other (comment) Oral Motor/Sensory Function: Suspected cranial nerve impairment CN V - Trigeminal: Not tested CN VII - Facial: Right motor impairment CN XII - Hypoglossal: Right motor impairment Anatomy: Anatomy: Suspected cervical osteophytes Boluses Administered: Boluses Administered Boluses Administered: Thin liquids (Level 0); Mildly thick liquids (Level 2, nectar thick); Moderately thick liquids (Level 3, honey thick); Puree; Solid  Oral Impairment Domain: Oral Impairment Domain Lip Closure: Interlabial  escape, no progression to anterior lip Tongue control during bolus hold: Posterior escape of less than half of bolus; Escape to lateral buccal cavity/floor of mouth Bolus preparation/mastication: Slow prolonged chewing/mashing with complete recollection (improved from prior MBS) Bolus transport/lingual motion: Brisk tongue motion Oral residue: Residue collection on oral structures Location of oral residue : Lateral sulci; Tongue Initiation of pharyngeal swallow : Pyriform sinuses  Pharyngeal Impairment Domain: Pharyngeal Impairment Domain Soft palate elevation: No bolus between soft palate (SP)/pharyngeal wall (PW) Laryngeal elevation: Complete superior movement of thyroid cartilage with complete approximation of arytenoids to epiglottic petiole Anterior hyoid excursion: Complete anterior movement Epiglottic movement: Complete inversion Laryngeal vestibule closure: Complete, no air/contrast in laryngeal vestibule Pharyngeal stripping wave : Present - complete Pharyngeal contraction (A/P view only): N/A Pharyngoesophageal segment opening: Partial distention/partial duration, partial obstruction of flow Tongue base retraction: No contrast between tongue base and posterior pharyngeal wall (PPW) Pharyngeal residue: Complete pharyngeal clearance (oral residue spill miminal but clear after initial swalllow) Location of pharyngeal residue: N/A  Esophageal Impairment Domain: Esophageal Impairment Domain Esophageal clearance upright position: Esophageal retention (min) Pill: No  data recorded Penetration/Aspiration Scale Score: Penetration/Aspiration Scale Score 1.  Material does not enter airway: Solid; Puree; Moderately thick liquids (Level 3, honey thick); Mildly thick liquids (Level 2, nectar thick) 2.  Material enters airway, remains ABOVE vocal cords then ejected out: Thin liquids (Level 0) 6.  Material enters airway, passes BELOW cords then ejected out: Thin liquids (Level 0) Compensatory Strategies: Compensatory  Strategies Compensatory strategies: Yes Straw: Effective Effective Straw: Thin liquid (Level 0) Chin tuck: Ineffective Ineffective Chin Tuck: Thin liquid (Level 0) Oral bolus hold: Effective Effective Oral Bolus Hold : Thin liquid (Level 0)   General Information: Caregiver present: No  Diet Prior to this Study: Dysphagia 2 (finely chopped); Mildly thick liquids (Level 2, nectar thick)   Temperature : Normal   Respiratory Status: WFL   Supplemental O2: None (Room air)   History of Recent Intubation: No  Behavior/Cognition: Alert; Cooperative; Pleasant mood Self-Feeding Abilities: Able to self-feed Baseline vocal quality/speech: Normal Volitional Cough: Able to elicit Volitional Swallow: Able to elicit Exam Limitations: No limitations Goal Planning: Prognosis for improved oropharyngeal function: Good Barriers to Reach Goals: Cognitive deficits No data recorded No data recorded Consulted and agree with results and recommendations: Patient; Family member/caregiver Pain: Pain Assessment Pain Assessment: No/denies pain End of Session: Start Time:SLP Start Time (ACUTE ONLY): 1208 Stop Time: SLP Stop Time (ACUTE ONLY): 1232 Time Calculation:SLP Time Calculation (min) (ACUTE ONLY): 24 min Charges: SLP Evaluations $ SLP Speech Visit: 1 Visit SLP Evaluations $MBS Swallow: 1 Procedure $ SLP EVAL LANGUAGE/SOUND PRODUCTION: 1 Procedure $Swallowing Treatment: 1 Procedure SLP visit diagnosis: SLP Visit Diagnosis: Dysphagia, oropharyngeal phase (R13.12) Past Medical History: Past Medical History: Diagnosis Date  Diabetes mellitus without complication (HCC)   Hypertension  Past Surgical History: Past Surgical History: Procedure Laterality Date  LEFT HEART CATH AND CORONARY ANGIOGRAPHY N/A 08/02/2017  Procedure: LEFT HEART CATH AND CORONARY ANGIOGRAPHY;  Surgeon: Levern Hutching, MD;  Location: [facility];  Service: Cardiovascular;  Laterality: N/A; Dustin Olam Bull 07/18/2024, 5:16 PM     Blood pressure 110/75, pulse  88, temperature 98.2 F (36.8 C), temperature source Oral, resp. rate 18, height 5' 4 (1.626 m), weight 76.2 kg, SpO2 95%.  Medical Problem List and Plan: 1. Functional deficits secondary to left internal capsule, basal ganglia infarction related to small vessel disease  -patient may shower  -ELOS/Goals: 2 weeks Min a PT/OT possibly WC level, sup SLP  -Admit to CIR 2.  Antithrombotics: -DVT/anticoagulation:  Pharmaceutical: Xarelto  x 30 days for DVT prophylaxis  -antiplatelet therapy: Aspirin  81 mg daily and Plavix  75 mg daily x 3 weeks then Plavix  alone (will send message to IM team to double check, note indicates asa after 3 weeks) 3. Pain Management: Voltaren  gel 2 g 4 times daily, lidocaine  patch 4. Mood/Behavior/Sleep: Provide emotional support  -antipsychotic agents: N/A 5. Neuropsych/cognition: This patient is capable of making decisions on her own behalf. 6. Skin/Wound Care: Routine skin checks 7. Fluids/Electrolytes/Nutrition: Routine in and outs with follow-up chemistries 8.  Hypertension.  Norvasc  5 mg daily, Cozaar  25 mg daily.  Monitor with increased mobility. Avoid hypotension 9.  Diabetes mellitus.  Hemoglobin A1c 9.5.  Glucophage  500 mg daily, NovoLog  3 units 3 times daily, Lantus  insulin  14 units daily, Farxiga  10 mg daily 10.  Hyperlipidemia.  Lipitor /Zetia  11.  Tobacco abuse as well as history of alcohol.  NicoDerm patch.  Alcohol negative on admission. 12.  Constipation. Increase miralax  to BID, continue senokot, start sorbitol PRN 13.  L knee pain.  Improved with conservative treatment.  X-ray indicates effusion and OA.  Toribio PARAS Angiulli, PA-C 07/19/2024

## 2024-07-19 NOTE — H&P (Signed)
 Physical Medicine and Rehabilitation Admission H&P    No chief complaint on file. : HPI: Darlene Pugh is a 63 year old right-handed female with history significant for depression, hyperlipidemia, alcohol use, hypertension, diabetes mellitus as well as tobacco use as well as history of CVA with residual left facial numbness maintained on aspirin  as well as Brilinta .  Per chart review patient lives alone.  Independent prior to admission and driving.  1 level home 4 steps to entry.  Presented 07/10/2024 after being found down on the ground by her property manager with facial droop dysarthria and weakness.  She endorses 2 recent falls without loss of consciousness.  Blood pressure on admission 160/99.  CTA showed no evidence of emergent large vessel occlusion.  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  MRI showed acute infarct involving the left internal capsule/basal ganglia.  Patient did not receive TNK.  LVO negative.  Admission chemistries unremarkable except potassium 3.4 glucose 218, alcohol negative, total CK of 172, hemoglobin A1c 9.5.  Echocardiogram ejection fraction of 55 to 60% no wall motion abnormalities.  Neurology follow-up currently maintained on aspirin  81 mg daily and Plavix  75 mg daily for 3 weeks then Plavix  alone.  She has been placed on Xarelto  10 mg daily for 30 days for VTE prophylaxis.  She did have a urine culture greater than 100,000 colonies yeast and completed 3-day course of Rocephin .  Patient reports she had left knee pain due to the fall before admission.  Patient reports this has been gradually improving.  Appears she was treated with Ace wrap's, ice packs and Toradol .  She had an x-ray with mild lateral compartmental degeneration arthritis and moderate joint effusion.  Her diet has been advanced to a regular consistency.  Therapy evaluations completed due to  patient's decreased functional mobility was admitted for a comprehensive rehab program.  Review of Systems  Constitutional:  Negative for chills and fever.  HENT:  Negative for hearing loss.   Eyes:  Negative for blurred vision and double vision.  Respiratory:  Negative for cough, shortness of breath and wheezing.   Cardiovascular:  Positive for leg swelling. Negative for chest pain and palpitations.  Gastrointestinal:  Positive for constipation. Negative for abdominal pain, heartburn, nausea and vomiting.  Genitourinary:  Positive for frequency. Negative for dysuria, flank pain and hematuria.       Incontinence  Musculoskeletal:  Positive for falls, joint pain and myalgias.  Skin:  Negative for rash.  Neurological:  Positive for sensory change, speech change and weakness. Negative for headaches.  Psychiatric/Behavioral:  The patient does not have insomnia.   All other systems reviewed and are negative.  Past Medical History:  Diagnosis Date   Diabetes mellitus without complication (HCC)    Hypertension    Past Surgical History:  Procedure Laterality Date   LEFT HEART CATH AND CORONARY ANGIOGRAPHY N/A 08/02/2017   Procedure: LEFT HEART CATH AND CORONARY ANGIOGRAPHY;  Surgeon: Levern Hutching, MD;  Location: [facility];  Service: Cardiovascular;  Laterality: N/A;   History reviewed. No pertinent family history. Social History:  reports that she has been smoking cigarettes. She has never used smokeless tobacco. She reports that she does not drink alcohol and does not use drugs. Allergies:  Allergies  Allergen Reactions   Neurontin [Gabapentin] Other (See Comments)    Myalgias    Medications Prior to Admission  Medication Sig Dispense Refill  acetaminophen  (TYLENOL ) 500 MG tablet Take 1 tablet (500 mg total) by mouth every 6 (six) hours as needed for mild pain (pain score 1-3), fever or headache. 30 tablet 0   [START ON 07/20/2024] amLODipine  (NORVASC ) 5 MG tablet Take 1  tablet (5 mg total) by mouth daily.     aspirin  EC 81 MG EC tablet Take 1 tablet (81 mg total) by mouth daily. 30 tablet 3   atorvastatin  (LIPITOR ) 80 MG tablet Take 1 tablet (80 mg total) by mouth daily at 6 PM. 30 tablet 3   clopidogrel  (PLAVIX ) 75 MG tablet Take 1 tablet (75 mg total) by mouth daily for 21 days.     diclofenac  Sodium (VOLTAREN ) 1 % GEL Apply 4 g topically 4 (four) times daily.     ezetimibe  (ZETIA ) 10 MG tablet Take 10 mg by mouth daily.     FARXIGA  10 MG TABS tablet Take 10 mg by mouth daily.     [START ON 07/20/2024] lidocaine  (LIDODERM ) 5 % Place 1 patch onto the skin daily. Remove & Discard patch within 12 hours or as directed by MD     [START ON 07/20/2024] losartan  (COZAAR ) 25 MG tablet Take 1 tablet (25 mg total) by mouth daily.     metFORMIN  (GLUCOPHAGE ) 500 MG tablet Take 1 tablet (500 mg total) by mouth daily with breakfast. 30 tablet 0   [START ON 07/20/2024] nicotine  (NICODERM CQ  - DOSED IN MG/24 HOURS) 21 mg/24hr patch Place 1 patch (21 mg total) onto the skin daily.     nitroGLYCERIN  (NITROSTAT ) 0.4 MG SL tablet Place 1 tablet (0.4 mg total) under the tongue every 5 (five) minutes x 3 doses as needed for chest pain. 25 tablet 12   pantoprazole  (PROTONIX ) 40 MG tablet Take 1 tablet (40 mg total) by mouth daily. 30 tablet 3   [START ON 07/20/2024] rivaroxaban  (XARELTO ) 10 MG TABS tablet Take 1 tablet (10 mg total) by mouth daily.        Home: Home Living Family/patient expects to be discharged to:: Private residence Living Arrangements: Alone   Functional History:    Functional Status:  Mobility:          ADL:    Cognition: Cognition Orientation Level: Oriented X4    Physical Exam: Blood pressure 94/76, pulse 95, temperature 98.1 F (36.7 C), temperature source Oral, resp. rate 16, height 5' 4 (1.626 m), weight 74 kg, SpO2 96%.  General: No apparent distress HEENT: Head is normocephalic, atraumatic, sclera anicteric, oral mucosa pink and  moist, dentures  Neck: Supple without JVD or lymphadenopathy Heart: Reg rate and rhythm. No murmurs rubs or gallops Chest: CTA bilaterally without wheezes, rales, or rhonchi; no distress Abdomen: Soft, non-tender, non-distended, bowel sounds positive. Extremities: No clubbing, cyanosis, or edema. Pulses are 2+ Psych: Pt's affect is appropriate. Pt is cooperative Skin: Clean and intact without signs of breakdown Neuro:    Mental Status: AAOx year and month, person, place,  Speech/Languate: Naming and repetition intact, Word finding deficits and mild expressive aphasia, dysarthria, follows simple commands CRANIAL NERVES: II: PERRL. Visual fields full III, IV, VI: EOM intact, no gaze preference or deviation V: normal sensation bilaterally VII: R facial weakness VIII: normal hearing to speech IX, X: normal palatal elevation XI: 5/5 head turn and 5/5 shoulder shrug bilaterally XII: Tongue midline   MOTOR: RUE: 4-/5 Deltoid, 4/5 Biceps, 4/5 Triceps,4/5 Grip LUE: 4+/5 Deltoid, 4+/5 Biceps, 4+/5 Triceps, 4+/5 Grip RLE: HF 4-/5, KE 4/5, ADF 4/5, APF 4/5  LLE: HF 4/5, KE 4+/5, ADF 4+/5, APF 4+/5   REFLEXES: Normal and symmetric, no Hoffman's, no clonus  SENSORY: Decreased RUE and RLE  Coordination: Slow finger-nose on right side, appears in proportional weakness  MSK: mild L knee effusion, minimal tenderness or pain with ROM, lidocaine  patch on anterior knee   Results for orders placed or performed during the hospital encounter of 07/10/24 (from the past 48 hours)  Glucose, capillary     Status: Abnormal   Collection Time: 07/17/24  4:35 PM  Result Value Ref Range   Glucose-Capillary 238 (H) 70 - 99 mg/dL    Comment: Glucose reference range applies only to samples taken after fasting for at least 8 hours.   Comment 1 Notify RN   Glucose, capillary     Status: Abnormal   Collection Time: 07/17/24  9:08 PM  Result Value Ref Range   Glucose-Capillary 333 (H) 70 - 99 mg/dL     Comment: Glucose reference range applies only to samples taken after fasting for at least 8 hours.  Glucose, capillary     Status: Abnormal   Collection Time: 07/18/24  6:15 AM  Result Value Ref Range   Glucose-Capillary 196 (H) 70 - 99 mg/dL    Comment: Glucose reference range applies only to samples taken after fasting for at least 8 hours.  Glucose, capillary     Status: Abnormal   Collection Time: 07/18/24 11:23 AM  Result Value Ref Range   Glucose-Capillary 171 (H) 70 - 99 mg/dL    Comment: Glucose reference range applies only to samples taken after fasting for at least 8 hours.  Glucose, capillary     Status: Abnormal   Collection Time: 07/18/24  1:45 PM  Result Value Ref Range   Glucose-Capillary 140 (H) 70 - 99 mg/dL    Comment: Glucose reference range applies only to samples taken after fasting for at least 8 hours.  Glucose, capillary     Status: Abnormal   Collection Time: 07/18/24  5:08 PM  Result Value Ref Range   Glucose-Capillary 190 (H) 70 - 99 mg/dL    Comment: Glucose reference range applies only to samples taken after fasting for at least 8 hours.   Comment 1 Document in Chart   Glucose, capillary     Status: Abnormal   Collection Time: 07/18/24  9:06 PM  Result Value Ref Range   Glucose-Capillary 205 (H) 70 - 99 mg/dL    Comment: Glucose reference range applies only to samples taken after fasting for at least 8 hours.   Comment 1 Notify RN    Comment 2 Document in Chart   Glucose, capillary     Status: Abnormal   Collection Time: 07/19/24  6:08 AM  Result Value Ref Range   Glucose-Capillary 134 (H) 70 - 99 mg/dL    Comment: Glucose reference range applies only to samples taken after fasting for at least 8 hours.   Comment 1 Notify RN    Comment 2 Document in Chart   Glucose, capillary     Status: Abnormal   Collection Time: 07/19/24 11:23 AM  Result Value Ref Range   Glucose-Capillary 280 (H) 70 - 99 mg/dL    Comment: Glucose reference range applies only to  samples taken after fasting for at least 8 hours.   DG Swallowing Func-Speech Pathology Result Date: 07/18/2024 Table formatting from the original result was not included. Modified Barium Swallow Study Patient Details Name: Golden Emile MRN: [id number] Date of Birth:  11-16-1960 Today's Date: 07/18/2024 HPI/PMH: HPI: Darlene Pugh is a 63 year old female admitted after fall. MRI acute infarct involving the left internal capsule/basal ganglia, chronic small vessel ischemic disease with multiple chronic lacunar infarcts. CXR no acute cardiopulmonary abnormality. PMH:   diabetes, hypertension, vitamin D deficiency, osteoarthritis.Repeat MBS for possible liquid/texture upgrade. Clinical Impression: Clinical Impression: Pt demonstrated improved swallow function from prior MBS. There was mild decreased lingual control with intermittent premature spill to valleculae and significantly less oral residue from previous study. Laryngeal strength and mobility are adequate with pt demonstrating decreased pharyngeal timing with barium filling  pyriforms resulting in one episode of aspiration, clearing with strong reflexive cough (PAS 6). Penetration resulted from premature spill with thin into vestibule once but was ejected during the swallow (PAS 2). A cue to orally hold smaller bolus for several seconds prior to transit resulted in only intermittent trace spill to valleculae with laryngeal protection during the swallow. She was able to consistently perform strategy over multiple trials. Trace barium ascended from PES x 1 and esophageal scan revealed mild retention. Mastication mildly slow without residue during or study or when observed with additional trial cracker after study completed. Recommend regular texture (dentures donned), small sips with brief bolus hold, pills whole in puree and check right side oral cavity for potential residue. Factors that may increase risk of adverse event in presence of  aspiration Noe & Lianne 2021): Factors that may increase risk of adverse event in presence of aspiration Noe & Lianne 2021): Reduced cognitive function Recommendations/Plan: Swallowing Evaluation Recommendations Swallowing Evaluation Recommendations Recommendations: PO diet PO Diet Recommendation: Regular; Thin liquids (Level 0) Liquid Administration via: Cup; Straw Medication Administration: Whole meds with puree Supervision: Patient able to self-feed; Full supervision/cueing for swallowing strategies (initially) Swallowing strategies  : Slow rate; Small bites/sips; Check for pocketing or oral holding (bolus hold with thin for several seconds prior to transit) Postural changes: Position pt fully upright for meals Oral care recommendations: Oral care BID (2x/day) Treatment Plan Treatment Plan Treatment recommendations: Therapy as outlined in treatment plan below Follow-up recommendations: Acute inpatient rehab (3 hours/day) Functional status assessment: Patient has had a recent decline in their functional status and demonstrates the ability to make significant improvements in function in a reasonable and predictable amount of time. Treatment frequency: Min 2x/week Treatment duration: 2 weeks Interventions: Patient/family education; Compensatory techniques; Diet toleration management by SLP Recommendations Recommendations for follow up therapy are one component of a multi-disciplinary discharge planning process, led by the attending physician.  Recommendations may be updated based on patient status, additional functional criteria and insurance authorization. Assessment: Orofacial Exam: Orofacial Exam Oral Cavity: Oral Hygiene: WFL Oral Cavity - Dentition: Dentures, top; Dentures, bottom Orofacial Anatomy: Other (comment) Oral Motor/Sensory Function: Suspected cranial nerve impairment CN V - Trigeminal: Not tested CN VII - Facial: Right motor impairment CN XII - Hypoglossal: Right motor impairment Anatomy:  Anatomy: Suspected cervical osteophytes Boluses Administered: Boluses Administered Boluses Administered: Thin liquids (Level 0); Mildly thick liquids (Level 2, nectar thick); Moderately thick liquids (Level 3, honey thick); Puree; Solid  Oral Impairment Domain: Oral Impairment Domain Lip Closure: Interlabial escape, no progression to anterior lip Tongue control during bolus hold: Posterior escape of less than half of bolus; Escape to lateral buccal cavity/floor of mouth Bolus preparation/mastication: Slow prolonged chewing/mashing with complete recollection (improved from prior MBS) Bolus transport/lingual motion: Brisk tongue motion Oral residue: Residue collection on oral structures Location of oral residue : Lateral sulci; Tongue Initiation of pharyngeal  swallow : Pyriform sinuses  Pharyngeal Impairment Domain: Pharyngeal Impairment Domain Soft palate elevation: No bolus between soft palate (SP)/pharyngeal wall (PW) Laryngeal elevation: Complete superior movement of thyroid cartilage with complete approximation of arytenoids to epiglottic petiole Anterior hyoid excursion: Complete anterior movement Epiglottic movement: Complete inversion Laryngeal vestibule closure: Complete, no air/contrast in laryngeal vestibule Pharyngeal stripping wave : Present - complete Pharyngeal contraction (A/P view only): N/A Pharyngoesophageal segment opening: Partial distention/partial duration, partial obstruction of flow Tongue base retraction: No contrast between tongue base and posterior pharyngeal wall (PPW) Pharyngeal residue: Complete pharyngeal clearance (oral residue spill miminal but clear after initial swalllow) Location of pharyngeal residue: N/A  Esophageal Impairment Domain: Esophageal Impairment Domain Esophageal clearance upright position: Esophageal retention (min) Pill: No data recorded Penetration/Aspiration Scale Score: Penetration/Aspiration Scale Score 1.  Material does not enter airway: Solid; Puree; Moderately  thick liquids (Level 3, honey thick); Mildly thick liquids (Level 2, nectar thick) 2.  Material enters airway, remains ABOVE vocal cords then ejected out: Thin liquids (Level 0) 6.  Material enters airway, passes BELOW cords then ejected out: Thin liquids (Level 0) Compensatory Strategies: Compensatory Strategies Compensatory strategies: Yes Straw: Effective Effective Straw: Thin liquid (Level 0) Chin tuck: Ineffective Ineffective Chin Tuck: Thin liquid (Level 0) Oral bolus hold: Effective Effective Oral Bolus Hold : Thin liquid (Level 0)   General Information: Caregiver present: No  Diet Prior to this Study: Dysphagia 2 (finely chopped); Mildly thick liquids (Level 2, nectar thick)   Temperature : Normal   Respiratory Status: WFL   Supplemental O2: None (Room air)   History of Recent Intubation: No  Behavior/Cognition: Alert; Cooperative; Pleasant mood Self-Feeding Abilities: Able to self-feed Baseline vocal quality/speech: Normal Volitional Cough: Able to elicit Volitional Swallow: Able to elicit Exam Limitations: No limitations Goal Planning: Prognosis for improved oropharyngeal function: Good Barriers to Reach Goals: Cognitive deficits No data recorded No data recorded Consulted and agree with results and recommendations: Patient; Family member/caregiver Pain: Pain Assessment Pain Assessment: No/denies pain End of Session: Start Time:SLP Start Time (ACUTE ONLY): 1208 Stop Time: SLP Stop Time (ACUTE ONLY): 1232 Time Calculation:SLP Time Calculation (min) (ACUTE ONLY): 24 min Charges: SLP Evaluations $ SLP Speech Visit: 1 Visit SLP Evaluations $MBS Swallow: 1 Procedure $ SLP EVAL LANGUAGE/SOUND PRODUCTION: 1 Procedure $Swallowing Treatment: 1 Procedure SLP visit diagnosis: SLP Visit Diagnosis: Dysphagia, oropharyngeal phase (R13.12) Past Medical History: Past Medical History: Diagnosis Date  Diabetes mellitus without complication (HCC)   Hypertension  Past Surgical History: Past Surgical History: Procedure  Laterality Date  LEFT HEART CATH AND CORONARY ANGIOGRAPHY N/A 08/02/2017  Procedure: LEFT HEART CATH AND CORONARY ANGIOGRAPHY;  Surgeon: Levern Hutching, MD;  Location: [facility];  Service: Cardiovascular;  Laterality: N/A; Dustin Olam Bull 07/18/2024, 5:16 PM          Physical Medicine and Rehabilitation Admission H&P        Chief Complaint  Patient presents with   Code Stroke  : HPI: Darlene Pugh is a 63 year old right-handed female with history significant for depression, hyperlipidemia, alcohol use, hypertension, diabetes mellitus as well as tobacco use as well as history of CVA with residual left facial numbness maintained on aspirin  as well as Brilinta .  Per chart review patient lives alone.  Independent prior to admission and driving.  1 level home 4 steps to entry.  Presented 07/10/2024 after being found down on the ground by her property manager with facial droop dysarthria and weakness.  She endorses 2 recent falls without  loss of consciousness.  Blood pressure on admission 160/99.  CTA showed no evidence of emergent large vessel occlusion.  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  MRI showed acute infarct involving the left internal capsule/basal ganglia.  Patient did not receive TNK.  LVO negative.  Admission chemistries unremarkable except potassium 3.4 glucose 218, alcohol negative, total CK of 172, hemoglobin A1c 9.5.  Echocardiogram ejection fraction of 55 to 60% no wall motion abnormalities.  Neurology follow-up currently maintained on aspirin  81 mg daily and Plavix  75 mg daily for 3 weeks then Plavix  alone.  She has been placed on Xarelto  10 mg daily for 30 days for VTE prophylaxis.  She did have a urine culture greater than 100,000 colonies yeast and completed 3-day course of Rocephin .  Patient reports she had left knee pain due to the fall before  admission.  Patient reports this has been gradually improving.  Appears she was treated with Ace wrap's, ice packs and Toradol .  She had an x-ray with mild lateral compartmental degeneration arthritis and moderate joint effusion.  Her diet has been advanced to a regular consistency.  Therapy evaluations completed due to patient's decreased functional mobility was admitted for a comprehensive rehab program.   Review of Systems  Constitutional:  Negative for chills and fever.  HENT:  Negative for hearing loss.   Eyes:  Negative for blurred vision and double vision.  Respiratory:  Negative for cough, shortness of breath and wheezing.   Cardiovascular:  Positive for leg swelling. Negative for chest pain and palpitations.  Gastrointestinal:  Positive for constipation. Negative for abdominal pain, heartburn, nausea and vomiting.  Genitourinary:  Positive for frequency. Negative for dysuria, flank pain and hematuria.       Incontinence  Musculoskeletal:  Positive for falls, joint pain and myalgias.  Skin:  Negative for rash.  Neurological:  Positive for sensory change, speech change and weakness. Negative for headaches.  Psychiatric/Behavioral:  The patient does not have insomnia.   All other systems reviewed and are negative.      Past Medical History:  Diagnosis Date   Diabetes mellitus without complication (HCC)     Hypertension               Past Surgical History:  Procedure Laterality Date   LEFT HEART CATH AND CORONARY ANGIOGRAPHY N/A 08/02/2017    Procedure: LEFT HEART CATH AND CORONARY ANGIOGRAPHY;  Surgeon: Levern Hutching, MD;  Location: [facility];  Service: Cardiovascular;  Laterality: N/A;        History reviewed. No pertinent family history.     Social History:  reports that she has been smoking cigarettes. She has never used smokeless tobacco. She reports that she does not drink alcohol and does not use drugs. Allergies:  Allergies       Allergies  Allergen  Reactions   Gabapentin        Other Reaction(s): Intolerance - Will Not Trigger Allergy Alert   Severe muscle pain            Medications Prior to Admission  Medication Sig Dispense Refill   aspirin  EC 81 MG EC tablet Take 1 tablet (81 mg total) by mouth daily. 30 tablet 3   atorvastatin  (LIPITOR ) 80 MG tablet Take 1 tablet (80 mg total) by mouth daily at 6 PM. 30 tablet 3   ezetimibe  (ZETIA ) 10 MG tablet Take 10 mg by  mouth daily.       FARXIGA  10 MG TABS tablet Take 10 mg by mouth daily.       lidocaine  (LIDODERM ) 5 % Place 1 patch onto the skin daily as needed (pain).       lisinopril  (ZESTRIL ) 40 MG tablet Take 40 mg by mouth 2 (two) times daily.       meloxicam  (MOBIC ) 7.5 MG tablet Take 7.5 mg by mouth daily as needed for pain.       metFORMIN  (GLUCOPHAGE ) 500 MG tablet Take 1 tablet (500 mg total) by mouth daily with breakfast. 30 tablet 0   metoprolol  succinate (TOPROL -XL) 100 MG 24 hr tablet Take 100 mg by mouth daily.       nitroGLYCERIN  (NITROSTAT ) 0.4 MG SL tablet Place 1 tablet (0.4 mg total) under the tongue every 5 (five) minutes x 3 doses as needed for chest pain. 25 tablet 12   pantoprazole  (PROTONIX ) 40 MG tablet Take 1 tablet (40 mg total) by mouth daily. 30 tablet 3   QUEtiapine (SEROQUEL XR) 50 MG TB24 24 hr tablet Take 50 mg by mouth at bedtime.       ticagrelor  (BRILINTA ) 90 MG TABS tablet Take 1 tablet (90 mg total) by mouth 2 (two) times daily. 60 tablet 3   VENTOLIN  HFA 108 (90 Base) MCG/ACT inhaler Inhale 2 puffs into the lungs every 6 (six) hours as needed.       Vitamin D, Ergocalciferol, (DRISDOL) 50000 units CAPS capsule Take 50,000 Units by mouth every 7 (seven) days.                  Home: Home Living Family/patient expects to be discharged to:: Private residence Living Arrangements: Alone Available Help at Discharge: Family, Available PRN/intermittently (dtr) Type of Home: Apartment Home Access: Stairs to enter Entrance Stairs-Number of Steps:  4 Entrance Stairs-Rails: Right, Left, Can reach both Home Layout: One level Bathroom Shower/Tub: Engineer, Manufacturing Systems: Standard Bathroom Accessibility: Yes Home Equipment: Information systems manager, Standard Environmental Consultant, Medical Laboratory Scientific Officer - single point  Lives With: Alone   Functional History: Prior Function Prior Level of Function : Independent/Modified Independent, Driving (2 recent falls) Mobility Comments: standard walker vs no DME; pt reports a limp ADLs Comments: indep in ADL, IADL, driving, grocery shopping   Functional Status:  Mobility: Bed Mobility Overal bed mobility: Needs Assistance Bed Mobility: Sit to Supine, Supine to Sit Supine to sit: Contact guard, Used rails, HOB elevated Sit to supine: Contact guard assist, Used rails, HOB elevated General bed mobility comments: CGA for safety. Pt used rails Transfers Overall transfer level: Needs assistance Equipment used: Rolling walker (2 wheels) Transfers: Sit to/from Stand Sit to Stand: Contact guard assist Bed to/from chair/wheelchair/BSC transfer type:: Via Lift equipment Step pivot transfers: +2 physical assistance, Mod assist Transfer via Lift Equipment: Stedy General transfer comment: Pt stood on her own today with no physical assistance. Ambulation/Gait Ambulation/Gait assistance: +2 physical assistance, +2 safety/equipment, Min assist, Mod assist, Max assist Gait Distance (Feet): 15 Feet Assistive device: Rolling walker (2 wheels) Gait Pattern/deviations: Knees buckling, Trunk flexed, Wide base of support, Ataxic, Decreased stride length, Step-through pattern General Gait Details: Pt today able to ambulate to door and back with RW. 1 seated rest break. Once at door pt noted with significant knee buckling requiring +2 Mod/Max to correct. Mostly +2 Min A otherwise. Would benefit from close chair follow next session. Gait velocity: decreased   ADL: ADL Overall ADL's : Needs assistance/impaired Eating/Feeding: Minimal assistance,  Sitting  Eating/Feeding Details (indicate cue type and reason): assistance with opening containers Grooming: Wash/dry hands, Wash/dry face, Oral care, Supervision/safety, Sitting Grooming Details (indicate cue type and reason): able to use RUE to hold toothbrush when applying toothpaste Upper Body Bathing: Moderate assistance, Sitting Lower Body Bathing: Maximal assistance, Sit to/from stand Lower Body Bathing Details (indicate cue type and reason): stood in [place] for peri area bathing Upper Body Dressing : Moderate assistance, Sitting Lower Body Dressing: Maximal assistance, Sitting/lateral leans, Sit to/from stand Lower Body Dressing Details (indicate cue type and reason): to donn socks Toilet Transfer: Moderate assistance, +2 for physical assistance, +2 for safety/equipment, Stand-pivot Functional mobility during ADLs: Moderate assistance, +2 for safety/equipment, +2 for physical assistance General ADL Comments: increased RUE use with gross assist with self care   Cognition: Cognition Overall Cognitive Status: Impaired/Different from baseline Arousal/Alertness: Awake/alert Orientation Level: Oriented X4 Year: 2025 Month: December Day of Week: Correct Attention: Sustained Sustained Attention: Appears intact Memory: Impaired Memory Impairment:  (0/4 recall, severe impairment) Awareness: Impaired Awareness Impairment: Intellectual impairment, Emergent impairment Problem Solving:  (functional for verbal tasks) Safety/Judgment: Other (comment) (needs supervision initially) Cognition Arousal: Alert Behavior During Therapy: Flat affect, WFL for tasks assessed/performed, Impulsive Overall Cognitive Status: Impaired/Different from baseline   Physical Exam: Blood pressure 110/75, pulse 88, temperature 98.2 F (36.8 C), temperature source Oral, resp. rate 18, height 5' 4 (1.626 m), weight 76.2 kg, SpO2 95%.   General: No apparent distress HEENT: Head is normocephalic, atraumatic,  sclera anicteric, oral mucosa pink and moist, dentures  Neck: Supple without JVD or lymphadenopathy Heart: Reg rate and rhythm. No murmurs rubs or gallops Chest: CTA bilaterally without wheezes, rales, or rhonchi; no distress Abdomen: Soft, non-tender, non-distended, bowel sounds positive. Extremities: No clubbing, cyanosis, or edema. Pulses are 2+ Psych: Pt's affect is appropriate. Pt is cooperative Skin: Clean and intact without signs of breakdown Neuro:     Mental Status: AAOx year and month, person, place,  Speech/Languate: Naming and repetition intact, Word finding deficits and mild expressive aphasia, dysarthria, follows simple commands CRANIAL NERVES: II: PERRL. Visual fields full III, IV, VI: EOM intact, no gaze preference or deviation V: normal sensation bilaterally VII: R facial weakness VIII: normal hearing to speech IX, X: normal palatal elevation XI: 5/5 head turn and 5/5 shoulder shrug bilaterally XII: Tongue midline     MOTOR: RUE: 4-/5 Deltoid, 4/5 Biceps, 4/5 Triceps,4/5 Grip LUE: 4+/5 Deltoid, 4+/5 Biceps, 4+/5 Triceps, 4+/5 Grip RLE: HF 4-/5, KE 4/5, ADF 4/5, APF 4/5 LLE: HF 4/5, KE 4+/5, ADF 4+/5, APF 4+/5     REFLEXES: Normal and symmetric, no Hoffman's, no clonus   SENSORY: Decreased RUE and RLE   Coordination: Slow finger-nose on right side, appears in proportional weakness   MSK: mild L knee effusion, minimal tenderness or pain with ROM, lidocaine  patch on anterior knee     Lab Results Last 48 Hours        Results for orders placed or performed during the hospital encounter of 07/10/24 (from the past 48 hours)  Glucose, capillary     Status: Abnormal    Collection Time: 07/17/24 11:14 AM  Result Value Ref Range    Glucose-Capillary 335 (H) 70 - 99 mg/dL      Comment: Glucose reference range applies only to samples taken after fasting for at least 8 hours.    Comment 1 Notify RN    Glucose, capillary     Status: Abnormal    Collection Time:  07/17/24  4:35  PM  Result Value Ref Range    Glucose-Capillary 238 (H) 70 - 99 mg/dL      Comment: Glucose reference range applies only to samples taken after fasting for at least 8 hours.    Comment 1 Notify RN    Glucose, capillary     Status: Abnormal    Collection Time: 07/17/24  9:08 PM  Result Value Ref Range    Glucose-Capillary 333 (H) 70 - 99 mg/dL      Comment: Glucose reference range applies only to samples taken after fasting for at least 8 hours.  Glucose, capillary     Status: Abnormal    Collection Time: 07/18/24  6:15 AM  Result Value Ref Range    Glucose-Capillary 196 (H) 70 - 99 mg/dL      Comment: Glucose reference range applies only to samples taken after fasting for at least 8 hours.  Glucose, capillary     Status: Abnormal    Collection Time: 07/18/24 11:23 AM  Result Value Ref Range    Glucose-Capillary 171 (H) 70 - 99 mg/dL      Comment: Glucose reference range applies only to samples taken after fasting for at least 8 hours.  Glucose, capillary     Status: Abnormal    Collection Time: 07/18/24  1:45 PM  Result Value Ref Range    Glucose-Capillary 140 (H) 70 - 99 mg/dL      Comment: Glucose reference range applies only to samples taken after fasting for at least 8 hours.  Glucose, capillary     Status: Abnormal    Collection Time: 07/18/24  5:08 PM  Result Value Ref Range    Glucose-Capillary 190 (H) 70 - 99 mg/dL      Comment: Glucose reference range applies only to samples taken after fasting for at least 8 hours.    Comment 1 Document in Chart    Glucose, capillary     Status: Abnormal    Collection Time: 07/18/24  9:06 PM  Result Value Ref Range    Glucose-Capillary 205 (H) 70 - 99 mg/dL      Comment: Glucose reference range applies only to samples taken after fasting for at least 8 hours.    Comment 1 Notify RN      Comment 2 Document in Chart    Glucose, capillary     Status: Abnormal    Collection Time: 07/19/24  6:08 AM  Result Value Ref Range     Glucose-Capillary 134 (H) 70 - 99 mg/dL      Comment: Glucose reference range applies only to samples taken after fasting for at least 8 hours.    Comment 1 Notify RN      Comment 2 Document in Chart         Imaging Results (Last 48 hours)  DG Swallowing Func-Speech Pathology Result Date: 07/18/2024 Table formatting from the original result was not included. Modified Barium Swallow Study Patient Details Name: Darlene Pugh MRN: [id number] Date of Birth: 08-02-1961 Today's Date: 07/18/2024 HPI/PMH: HPI: Darlene Pugh is a 63 year old female admitted after fall. MRI acute infarct involving the left internal capsule/basal ganglia, chronic small vessel ischemic disease with multiple chronic lacunar infarcts. CXR no acute cardiopulmonary abnormality. PMH:   diabetes, hypertension, vitamin D deficiency, osteoarthritis.Repeat MBS for possible liquid/texture upgrade. Clinical Impression: Clinical Impression: Pt demonstrated improved swallow function from prior MBS. There was mild decreased lingual control with intermittent premature spill to valleculae and significantly less oral residue  from previous study. Laryngeal strength and mobility are adequate with pt demonstrating decreased pharyngeal timing with barium filling  pyriforms resulting in one episode of aspiration, clearing with strong reflexive cough (PAS 6). Penetration resulted from premature spill with thin into vestibule once but was ejected during the swallow (PAS 2). A cue to orally hold smaller bolus for several seconds prior to transit resulted in only intermittent trace spill to valleculae with laryngeal protection during the swallow. She was able to consistently perform strategy over multiple trials. Trace barium ascended from PES x 1 and esophageal scan revealed mild retention. Mastication mildly slow without residue during or study or when observed with additional trial cracker after study completed. Recommend regular  texture (dentures donned), small sips with brief bolus hold, pills whole in puree and check right side oral cavity for potential residue. Factors that may increase risk of adverse event in presence of aspiration Noe & Lianne 2021): Factors that may increase risk of adverse event in presence of aspiration Noe & Lianne 2021): Reduced cognitive function Recommendations/Plan: Swallowing Evaluation Recommendations Swallowing Evaluation Recommendations Recommendations: PO diet PO Diet Recommendation: Regular; Thin liquids (Level 0) Liquid Administration via: Cup; Straw Medication Administration: Whole meds with puree Supervision: Patient able to self-feed; Full supervision/cueing for swallowing strategies (initially) Swallowing strategies  : Slow rate; Small bites/sips; Check for pocketing or oral holding (bolus hold with thin for several seconds prior to transit) Postural changes: Position pt fully upright for meals Oral care recommendations: Oral care BID (2x/day) Treatment Plan Treatment Plan Treatment recommendations: Therapy as outlined in treatment plan below Follow-up recommendations: Acute inpatient rehab (3 hours/day) Functional status assessment: Patient has had a recent decline in their functional status and demonstrates the ability to make significant improvements in function in a reasonable and predictable amount of time. Treatment frequency: Min 2x/week Treatment duration: 2 weeks Interventions: Patient/family education; Compensatory techniques; Diet toleration management by SLP Recommendations Recommendations for follow up therapy are one component of a multi-disciplinary discharge planning process, led by the attending physician.  Recommendations may be updated based on patient status, additional functional criteria and insurance authorization. Assessment: Orofacial Exam: Orofacial Exam Oral Cavity: Oral Hygiene: WFL Oral Cavity - Dentition: Dentures, top; Dentures, bottom Orofacial Anatomy:  Other (comment) Oral Motor/Sensory Function: Suspected cranial nerve impairment CN V - Trigeminal: Not tested CN VII - Facial: Right motor impairment CN XII - Hypoglossal: Right motor impairment Anatomy: Anatomy: Suspected cervical osteophytes Boluses Administered: Boluses Administered Boluses Administered: Thin liquids (Level 0); Mildly thick liquids (Level 2, nectar thick); Moderately thick liquids (Level 3, honey thick); Puree; Solid  Oral Impairment Domain: Oral Impairment Domain Lip Closure: Interlabial escape, no progression to anterior lip Tongue control during bolus hold: Posterior escape of less than half of bolus; Escape to lateral buccal cavity/floor of mouth Bolus preparation/mastication: Slow prolonged chewing/mashing with complete recollection (improved from prior MBS) Bolus transport/lingual motion: Brisk tongue motion Oral residue: Residue collection on oral structures Location of oral residue : Lateral sulci; Tongue Initiation of pharyngeal swallow : Pyriform sinuses  Pharyngeal Impairment Domain: Pharyngeal Impairment Domain Soft palate elevation: No bolus between soft palate (SP)/pharyngeal wall (PW) Laryngeal elevation: Complete superior movement of thyroid cartilage with complete approximation of arytenoids to epiglottic petiole Anterior hyoid excursion: Complete anterior movement Epiglottic movement: Complete inversion Laryngeal vestibule closure: Complete, no air/contrast in laryngeal vestibule Pharyngeal stripping wave : Present - complete Pharyngeal contraction (A/P view only): N/A Pharyngoesophageal segment opening: Partial distention/partial duration, partial obstruction of flow Tongue base retraction: No contrast  between tongue base and posterior pharyngeal wall (PPW) Pharyngeal residue: Complete pharyngeal clearance (oral residue spill miminal but clear after initial swalllow) Location of pharyngeal residue: N/A  Esophageal Impairment Domain: Esophageal Impairment Domain Esophageal  clearance upright position: Esophageal retention (min) Pill: No data recorded Penetration/Aspiration Scale Score: Penetration/Aspiration Scale Score 1.  Material does not enter airway: Solid; Puree; Moderately thick liquids (Level 3, honey thick); Mildly thick liquids (Level 2, nectar thick) 2.  Material enters airway, remains ABOVE vocal cords then ejected out: Thin liquids (Level 0) 6.  Material enters airway, passes BELOW cords then ejected out: Thin liquids (Level 0) Compensatory Strategies: Compensatory Strategies Compensatory strategies: Yes Straw: Effective Effective Straw: Thin liquid (Level 0) Chin tuck: Ineffective Ineffective Chin Tuck: Thin liquid (Level 0) Oral bolus hold: Effective Effective Oral Bolus Hold : Thin liquid (Level 0)   General Information: Caregiver present: No  Diet Prior to this Study: Dysphagia 2 (finely chopped); Mildly thick liquids (Level 2, nectar thick)   Temperature : Normal   Respiratory Status: WFL   Supplemental O2: None (Room air)   History of Recent Intubation: No  Behavior/Cognition: Alert; Cooperative; Pleasant mood Self-Feeding Abilities: Able to self-feed Baseline vocal quality/speech: Normal Volitional Cough: Able to elicit Volitional Swallow: Able to elicit Exam Limitations: No limitations Goal Planning: Prognosis for improved oropharyngeal function: Good Barriers to Reach Goals: Cognitive deficits No data recorded No data recorded Consulted and agree with results and recommendations: Patient; Family member/caregiver Pain: Pain Assessment Pain Assessment: No/denies pain End of Session: Start Time:SLP Start Time (ACUTE ONLY): 1208 Stop Time: SLP Stop Time (ACUTE ONLY): 1232 Time Calculation:SLP Time Calculation (min) (ACUTE ONLY): 24 min Charges: SLP Evaluations $ SLP Speech Visit: 1 Visit SLP Evaluations $MBS Swallow: 1 Procedure $ SLP EVAL LANGUAGE/SOUND PRODUCTION: 1 Procedure $Swallowing Treatment: 1 Procedure SLP visit diagnosis: SLP Visit Diagnosis: Dysphagia,  oropharyngeal phase (R13.12) Past Medical History: Past Medical History: Diagnosis Date  Diabetes mellitus without complication (HCC)   Hypertension  Past Surgical History: Past Surgical History: Procedure Laterality Date  LEFT HEART CATH AND CORONARY ANGIOGRAPHY N/A 08/02/2017  Procedure: LEFT HEART CATH AND CORONARY ANGIOGRAPHY;  Surgeon: Levern Hutching, MD;  Location: [facility];  Service: Cardiovascular;  Laterality: N/A; Dustin Olam Bull 07/18/2024, 5:16 PM          Blood pressure 110/75, pulse 88, temperature 98.2 F (36.8 C), temperature source Oral, resp. rate 18, height 5' 4 (1.626 m), weight 76.2 kg, SpO2 95%.  Medical Problem List and Plan: 1. Functional deficits secondary to left internal capsule, basal ganglia infarction related to small vessel disease  -patient may shower  -ELOS/Goals: 2 weeks Min a PT/OT possibly WC level, sup SLP  -Admit to CIR 2.  Antithrombotics: -DVT/anticoagulation:  Pharmaceutical: Xarelto  x 30 days for DVT prophylaxis  -antiplatelet therapy: Aspirin  81 mg daily and Plavix  75 mg daily x 3 weeks then Plavix  alone-confirmed with IM team 3. Pain Management: Voltaren  gel 2 g 4 times daily, lidocaine  patch 4. Mood/Behavior/Sleep: Provide emotional support  -antipsychotic agents: N/A 5. Neuropsych/cognition: This patient is capable of making decisions on her own behalf. 6. Skin/Wound Care: Routine skin checks 7. Fluids/Electrolytes/Nutrition: Routine in and outs with follow-up chemistries 8.  Hypertension.  Norvasc  5 mg daily, Cozaar  25 mg daily.  Monitor with increased mobility. Avoid hypotension 9.  Diabetes mellitus.  Hemoglobin A1c 9.5.  Glucophage  500 mg daily, NovoLog  3 units 3 times daily, Lantus  insulin  14 units daily, Farxiga  10 mg daily 10.  Hyperlipidemia.  Lipitor /Zetia  11.  Tobacco abuse as well as history of alcohol.  NicoDerm patch.  Alcohol negative on admission. 12.  Constipation. Increase miralax  to BID, continue senokot, start  sorbitol PRN 13.  L knee pain.  Improved with conservative treatment.  X-ray indicates effusion and OA.  Toribio PARAS Angiulli, PA-C 07/19/2024  I have personally performed a face to face diagnostic evaluation of this patient and formulated the key components of the plan.  Additionally, I have personally reviewed laboratory data, imaging studies, as well as relevant notes and concur with the physician assistant's documentation above.  The patient's status has not changed from the original H&P.  Any changes in documentation from the acute care chart have been noted above.  Murray Collier, MD

## 2024-07-19 NOTE — Discharge Summary (Shared)
 Physician Discharge Summary  Patient ID: Darlene Pugh MRN: [id number] DOB/AGE: [birth year] 63 y.o.  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 CLINICAL HISTORY: Abdominal pain FINDINGS: BOWEL: Nonobstructive bowel gas pattern. Contrast material extending from the hepatic flexure to the rectum. SOFT TISSUES: Cholecystectomy clips in right upper quadrant. BONES: No acute fracture. IMPRESSION: 1. No acute findings. Electronically signed by: Franky Stanford MD 07/21/2024 08:58 PM EST RP Workstation: [id number]   DG Swallowing Func-Speech Pathology Result Date: 07/18/2024 Table formatting from the original result was not included. Modified Barium Swallow Study Patient Details Name: Darlene Pugh MRN: [id number] Date of Birth: February 03, 1961 Today's Date: 07/18/2024 HPI/PMH: HPI: Darlene Pugh is a 63 year old female admitted after fall. MRI acute infarct involving the left internal capsule/basal ganglia, chronic small vessel ischemic disease with multiple chronic lacunar infarcts. CXR no acute cardiopulmonary abnormality. PMH:   diabetes, hypertension, vitamin D deficiency, osteoarthritis.Repeat MBS for possible liquid/texture upgrade. Clinical Impression: Clinical Impression: Pt demonstrated improved swallow function from prior MBS. There was mild decreased lingual control with intermittent premature spill to valleculae and significantly less oral residue from previous study. Laryngeal strength and mobility are adequate with pt  demonstrating decreased pharyngeal timing with barium filling  pyriforms resulting in one episode of aspiration, clearing with strong reflexive cough (PAS 6). Penetration resulted from premature spill with thin into vestibule once but was ejected during the swallow (PAS 2). A cue to orally hold smaller bolus for several seconds prior to transit resulted in only intermittent trace spill to valleculae with laryngeal protection during the swallow. She was able to consistently perform strategy over multiple trials. Trace barium ascended from PES x 1 and esophageal scan revealed mild retention. Mastication mildly slow without residue during or study or when observed with additional trial cracker after study completed. Recommend regular texture (dentures donned), small sips with brief bolus hold, pills whole in puree and check right side oral cavity for potential residue. Factors that may increase risk of adverse event in presence of aspiration Noe & Lianne 2021): Factors that may increase risk of adverse event in presence of aspiration Noe & Lianne 2021): Reduced cognitive function Recommendations/Plan: Swallowing Evaluation Recommendations Swallowing Evaluation Recommendations Recommendations: PO diet PO Diet Recommendation: Regular; Thin liquids (Level 0) Liquid Administration via: Cup; Straw Medication Administration: Whole meds with puree Supervision: Patient able to self-feed; Full supervision/cueing for swallowing strategies (initially) Swallowing strategies  : Slow rate; Small bites/sips; Check for pocketing or oral holding (bolus hold with thin for several seconds prior to transit) Postural changes: Position pt fully upright for meals Oral care recommendations: Oral care BID (2x/day) Treatment Plan Treatment Plan Treatment recommendations: Therapy as outlined in treatment plan below Follow-up recommendations: Acute inpatient rehab (3 hours/day) Functional status assessment: Patient has had a recent  decline in their functional status and demonstrates the ability to make significant improvements in function in a reasonable and predictable amount of time. Treatment frequency: Min 2x/week Treatment duration: 2 weeks Interventions: Patient/family education; Compensatory techniques; Diet toleration management by SLP Recommendations Recommendations for follow up therapy are one component of a multi-disciplinary discharge planning process, led by the  attending physician.  Recommendations may be updated based on patient status, additional functional criteria and insurance authorization. Assessment: Orofacial Exam: Orofacial Exam Oral Cavity: Oral Hygiene: WFL Oral Cavity - Dentition: Dentures, top; Dentures, bottom Orofacial Anatomy: Other (comment) Oral Motor/Sensory Function: Suspected cranial nerve impairment CN V - Trigeminal: Not tested CN VII - Facial: Right motor impairment CN XII - Hypoglossal: Right motor impairment Anatomy: Anatomy: Suspected cervical osteophytes Boluses Administered: Boluses Administered Boluses Administered: Thin liquids (Level 0); Mildly thick liquids (Level 2, nectar thick); Moderately thick liquids (Level 3, honey thick); Puree; Solid  Oral Impairment Domain: Oral Impairment Domain Lip Closure: Interlabial escape, no progression to anterior lip Tongue control during bolus hold: Posterior escape of less than half of bolus; Escape to lateral buccal cavity/floor of mouth Bolus preparation/mastication: Slow prolonged chewing/mashing with complete recollection (improved from prior MBS) Bolus transport/lingual motion: Brisk tongue motion Oral residue: Residue collection on oral structures Location of oral residue : Lateral sulci; Tongue Initiation of pharyngeal swallow : Pyriform sinuses  Pharyngeal Impairment Domain: Pharyngeal Impairment Domain Soft palate elevation: No bolus between soft palate (SP)/pharyngeal wall (PW) Laryngeal elevation: Complete superior movement of thyroid cartilage  with complete approximation of arytenoids to epiglottic petiole Anterior hyoid excursion: Complete anterior movement Epiglottic movement: Complete inversion Laryngeal vestibule closure: Complete, no air/contrast in laryngeal vestibule Pharyngeal stripping wave : Present - complete Pharyngeal contraction (A/P view only): N/A Pharyngoesophageal segment opening: Partial distention/partial duration, partial obstruction of flow Tongue base retraction: No contrast between tongue base and posterior pharyngeal wall (PPW) Pharyngeal residue: Complete pharyngeal clearance (oral residue spill miminal but clear after initial swalllow) Location of pharyngeal residue: N/A  Esophageal Impairment Domain: Esophageal Impairment Domain Esophageal clearance upright position: Esophageal retention (min) Pill: No data recorded Penetration/Aspiration Scale Score: Penetration/Aspiration Scale Score 1.  Material does not enter airway: Solid; Puree; Moderately thick liquids (Level 3, honey thick); Mildly thick liquids (Level 2, nectar thick) 2.  Material enters airway, remains ABOVE vocal cords then ejected out: Thin liquids (Level 0) 6.  Material enters airway, passes BELOW cords then ejected out: Thin liquids (Level 0) Compensatory Strategies: Compensatory Strategies Compensatory strategies: Yes Straw: Effective Effective Straw: Thin liquid (Level 0) Chin tuck: Ineffective Ineffective Chin Tuck: Thin liquid (Level 0) Oral bolus hold: Effective Effective Oral Bolus Hold : Thin liquid (Level 0)   General Information: Caregiver present: No  Diet Prior to this Study: Dysphagia 2 (finely chopped); Mildly thick liquids (Level 2, nectar thick)   Temperature : Normal   Respiratory Status: WFL   Supplemental O2: None (Room air)   History of Recent Intubation: No  Behavior/Cognition: Alert; Cooperative; Pleasant mood Self-Feeding Abilities: Able to self-feed Baseline vocal quality/speech: Normal Volitional Cough: Able to elicit Volitional Swallow:  Able to elicit Exam Limitations: No limitations Goal Planning: Prognosis for improved oropharyngeal function: Good Barriers to Reach Goals: Cognitive deficits No data recorded No data recorded Consulted and agree with results and recommendations: Patient; Family member/caregiver Pain: Pain Assessment Pain Assessment: No/denies pain End of Session: Start Time:SLP Start Time (ACUTE ONLY): 1208 Stop Time: SLP Stop Time (ACUTE ONLY): 1232 Time Calculation:SLP Time Calculation (min) (ACUTE ONLY): 24 min Charges: SLP Evaluations $ SLP Speech Visit: 1 Visit SLP Evaluations $MBS Swallow: 1 Procedure $ SLP EVAL LANGUAGE/SOUND PRODUCTION: 1 Procedure $Swallowing Treatment: 1 Procedure SLP visit diagnosis: SLP Visit Diagnosis: Dysphagia, oropharyngeal phase (R13.12) Past Medical History: Past Medical History: Diagnosis Date  Diabetes mellitus without complication (HCC)   Hypertension  Past Surgical History: Past Surgical History:  Procedure Laterality Date  LEFT HEART CATH AND CORONARY ANGIOGRAPHY N/A 08/02/2017  Procedure: LEFT HEART CATH AND CORONARY ANGIOGRAPHY;  Surgeon: Levern Hutching, MD;  Location: [facility];  Service: Cardiovascular;  Laterality: N/A; Dustin Olam Bull 07/18/2024, 5:16 PM  DG Knee Complete 4 Views Left Result Date: 07/15/2024 EXAM: 4 VIEW(S) XRAY OF THE LEFT KNEE 07/15/2024 06:54:00 PM COMPARISON: None available. CLINICAL HISTORY: [id number] Fall [id number] FINDINGS: BONES AND JOINTS: No acute fracture. No malalignment. Mild lateral compartmental degenerative arthritis. Moderate joint effusion. SOFT TISSUES: Vascular calcifications. IMPRESSION: 1. Moderate joint effusion. Electronically signed by: Dorethia Molt MD 07/15/2024 07:41 PM EST RP Workstation: [id number]   DG Swallowing Func-Speech Pathology Result Date: 07/11/2024 Table formatting from the original result was not included. Modified Barium Swallow Study Patient Details Name: Darlene Pugh MRN: [id number] Date of Birth:  [birth year] Today's Date: 07/11/2024 HPI/PMH: HPI: Darlene Pugh is a 63 year old female admitted after fall. MRI acute infarct involving the left internal capsule/basal ganglia, chronic small vessel ischemic disease with multiple chronic lacunar infarcts. CXR no acute cardiopulmonary abnormality. PMH:   diabetes, hypertension, vitamin D deficiency, osteoarthritis. Clinical Impression: Clinical Impression: Pt demonstrated a primary oral dysphagia marked by reduced lingual control and cohesion resulting in frequent oral residue spilling to pyriforms with most consistencies and aspirating with thin x 2 (sensed) during subsequent spontaneous swallow.  Chin tuck posture resulted in trace silent aspiration. Her laryngeal strength and mobility are adequate with several instances of decreased sensation of mild residue with thick consistencies. Verbal cues for subsequent swallow clears residue. Mastication with cracker was mildly slow. Recommend Dys 2 texture, nectar thick liquids, crush meds, swallow twice, right lingual sweep and masticate on left side oral cavity Factors that may increase risk of adverse event in presence of aspiration Noe & Lianne 2021): No data recorded Recommendations/Plan: Swallowing Evaluation Recommendations Swallowing Evaluation Recommendations Recommendations: PO diet PO Diet Recommendation: Dysphagia 2 (Finely chopped); Mildly thick liquids (Level 2, nectar thick) Liquid Administration via: Cup; Straw Medication Administration: Crushed with puree Supervision: Staff to assist with self-feeding Swallowing strategies  : Slow rate; Small bites/sips; Check for pocketing or oral holding; Multiple dry swallows after each bite/sip Postural changes: Position pt fully upright for meals Oral care recommendations: Oral care BID (2x/day) Treatment Plan Treatment Plan Treatment recommendations: Therapy as outlined in treatment plan below Follow-up recommendations: Acute inpatient rehab (3  hours/day) Functional status assessment: Patient has had a recent decline in their functional status and demonstrates the ability to make significant improvements in function in a reasonable and predictable amount of time. Treatment frequency: Min 2x/week Treatment duration: 2 weeks Interventions: Patient/family education; Aspiration precaution training; Trials of upgraded texture/liquids; Diet toleration management by SLP Recommendations Recommendations for follow up therapy are one component of a multi-disciplinary discharge planning process, led by the attending physician.  Recommendations may be updated based on patient status, additional functional criteria and insurance authorization. Assessment: Orofacial Exam: Orofacial Exam Oral Cavity: Oral Hygiene: WFL Oral Cavity - Dentition: Dentures, top; Dentures, bottom Orofacial Anatomy: Other (comment) Oral Motor/Sensory Function: Suspected cranial nerve impairment CN V - Trigeminal: Not tested CN VII - Facial: Right motor impairment CN XII - Hypoglossal: Right motor impairment Anatomy: Anatomy: Suspected cervical osteophytes Boluses Administered: Boluses Administered Boluses Administered: Thin liquids (Level 0); Mildly thick liquids (Level 2, nectar thick); Moderately thick liquids (Level 3, honey thick); Puree; Solid  Oral Impairment Domain: Oral Impairment Domain Lip Closure: Interlabial escape, no progression to anterior lip Tongue control during  bolus hold: Posterior escape of less than half of bolus Bolus preparation/mastication: Slow prolonged chewing/mashing with complete recollection Bolus transport/lingual motion: Brisk tongue motion Oral residue: Residue collection on oral structures Location of oral residue : Floor of mouth; Tongue Initiation of pharyngeal swallow : Pyriform sinuses  Pharyngeal Impairment Domain: Pharyngeal Impairment Domain Soft palate elevation: No bolus between soft palate (SP)/pharyngeal wall (PW) Laryngeal elevation: Complete  superior movement of thyroid cartilage with complete approximation of arytenoids to epiglottic petiole Anterior hyoid excursion: Complete anterior movement Epiglottic movement: Complete inversion Laryngeal vestibule closure: Complete, no air/contrast in laryngeal vestibule Pharyngeal stripping wave : Present - complete Pharyngeal contraction (A/P view only): N/A Pharyngoesophageal segment opening: Complete distension and complete duration, no obstruction of flow Tongue base retraction: No contrast between tongue base and posterior pharyngeal wall (PPW) Pharyngeal residue: Collection of residue within or on pharyngeal structures Location of pharyngeal residue: Valleculae (from oral cavity)  Esophageal Impairment Domain: Esophageal Impairment Domain Esophageal clearance upright position: Esophageal retention (minimal) Pill: No data recorded Penetration/Aspiration Scale Score: Penetration/Aspiration Scale Score 1.  Material does not enter airway: Puree; Solid; Moderately thick liquids (Level 3, honey thick) 2.  Material enters airway, remains ABOVE vocal cords then ejected out: Mildly thick liquids (Level 2, nectar thick) 7.  Material enters airway, passes BELOW cords and not ejected out despite cough attempt by patient: Thin liquids (Level 0) 8.  Material enters airway, passes BELOW cords without attempt by patient to eject out (silent aspiration) : Thin liquids (Level 0) (with chin tuck) Compensatory Strategies: Compensatory Strategies Compensatory strategies: Yes Straw: Effective (flash x 1 with straw) Chin tuck: Ineffective Ineffective Chin Tuck: Thin liquid (Level 0)   General Information: Caregiver present: No  Diet Prior to this Study: NPO; Other (Comment) (ice chips)   Temperature : Normal   Respiratory Status: WFL   Supplemental O2: Nasal cannula   History of Recent Intubation: No  Behavior/Cognition: Alert; Cooperative; Pleasant mood Self-Feeding Abilities: Able to self-feed Baseline vocal quality/speech:  Normal Volitional Cough: Able to elicit Volitional Swallow: Able to elicit Exam Limitations: No limitations Goal Planning: Prognosis for improved oropharyngeal function: Good No data recorded No data recorded No data recorded Consulted and agree with results and recommendations: Patient; Family member/caregiver Pain: Pain Assessment Pain Assessment: No/denies pain End of Session: Start Time:SLP Start Time (ACUTE ONLY): 1349 Stop Time: SLP Stop Time (ACUTE ONLY): 1406 Time Calculation:SLP Time Calculation (min) (ACUTE ONLY): 17 min Charges: SLP Evaluations $ SLP Speech Visit: 1 Visit SLP Evaluations $BSS Swallow: 1 Procedure $MBS Swallow: 1 Procedure SLP visit diagnosis: SLP Visit Diagnosis: Dysphagia, oropharyngeal phase (R13.12) Past Medical History: Past Medical History: Diagnosis Date  Diabetes mellitus without complication (HCC)   Hypertension  Past Surgical History: Past Surgical History: Procedure Laterality Date  LEFT HEART CATH AND CORONARY ANGIOGRAPHY N/A 08/02/2017  Procedure: LEFT HEART CATH AND CORONARY ANGIOGRAPHY;  Surgeon: Levern Hutching, MD;  Location: [facility];  Service: Cardiovascular;  Laterality: N/A; Dustin Olam Bull 07/11/2024, 6:11 PM  ECHOCARDIOGRAM COMPLETE BUBBLE STUDY Result Date: 07/11/2024    ECHOCARDIOGRAM REPORT   Patient Name:   Darlene Pugh Date of Exam: 07/11/2024 Medical Rec #:  [id number]                Height:       64.0 in Accession #:    [id number]               Weight:       168.0 lb Date of  Birth:  Oct 07, 1960                 BSA:          1.817 m Patient Age:    63 years                 BP:           112/81 mmHg Patient Gender: F                        HR:           100 bpm. Exam Location:  Inpatient Procedure: 2D Echo, Cardiac Doppler, Color Doppler and Saline Contrast Bubble            Study (Both Spectral and Color Flow Doppler were utilized during            procedure). Indications:    Stroke  History:        Patient has no prior history of  Echocardiogram examinations.                 Risk Factors:Hypertension, Diabetes, Dyslipidemia and Current                 Smoker.  Sonographer:    Philomena Daring Referring Phys: [id number] ERIN SCHLOSSMAN IMPRESSIONS  1. Left ventricular ejection fraction, by estimation, is 55 to 60%. The left ventricle has normal function. The left ventricle has no regional wall motion abnormalities. There is mild concentric left ventricular hypertrophy. Left ventricular diastolic parameters are indeterminate.  2. Right ventricular systolic function is normal. The right ventricular size is normal.  3. The mitral valve is normal in structure. No evidence of mitral valve regurgitation. No evidence of mitral stenosis.  4. The aortic valve is normal in structure. Aortic valve regurgitation is not visualized. No aortic stenosis is present.  5. The inferior vena cava is normal in size with greater than 50% respiratory variability, suggesting right atrial pressure of 3 mmHg. FINDINGS  Left Ventricle: Left ventricular ejection fraction, by estimation, is 55 to 60%. The left ventricle has normal function. The left ventricle has no regional wall motion abnormalities. The left ventricular internal cavity size was normal in size. There is  mild concentric left ventricular hypertrophy. Left ventricular diastolic parameters are indeterminate. Right Ventricle: The right ventricular size is normal. No increase in right ventricular wall thickness. Right ventricular systolic function is normal. Left Atrium: Left atrial size was normal in size. Right Atrium: Right atrial size was normal in size. Pericardium: There is no evidence of pericardial effusion. Mitral Valve: The mitral valve is normal in structure. No evidence of mitral valve regurgitation. No evidence of mitral valve stenosis. Tricuspid Valve: The tricuspid valve is normal in structure. Tricuspid valve regurgitation is not demonstrated. No evidence of tricuspid stenosis. Aortic Valve: The  aortic valve is normal in structure. Aortic valve regurgitation is not visualized. No aortic stenosis is present. Pulmonic Valve: The pulmonic valve was normal in structure. Pulmonic valve regurgitation is not visualized. No evidence of pulmonic stenosis. Aorta: The aortic root is normal in size and structure. Venous: The inferior vena cava is normal in size with greater than 50% respiratory variability, suggesting right atrial pressure of 3 mmHg. IAS/Shunts: No atrial level shunt detected by color flow Doppler. Agitated saline contrast was given intravenously to evaluate for intracardiac shunting.  LEFT VENTRICLE PLAX 2D LVIDd:         3.60 cm   Diastology  LVIDs:         2.50 cm   LV e' medial:    5.77 cm/s LV PW:         1.10 cm   LV E/e' medial:  9.5 LV IVS:        1.20 cm   LV e' lateral:   5.11 cm/s LVOT diam:     2.14 cm   LV E/e' lateral: 10.8 LV SV:         57 LV SV Index:   31 LVOT Area:     3.60 cm  RIGHT VENTRICLE             IVC RV S prime:     12.90 cm/s  IVC diam: 1.62 cm TAPSE (M-mode): 1.8 cm LEFT ATRIUM             Index        RIGHT ATRIUM           Index LA diam:        2.69 cm 1.48 cm/m   RA Area:     10.40 cm LA Vol (A2C):   24.6 ml 13.54 ml/m  RA Volume:   16.10 ml  8.86 ml/m LA Vol (A4C):   22.5 ml 12.39 ml/m LA Biplane Vol: 25.5 ml 14.04 ml/m  AORTIC VALVE LVOT Vmax:   105.00 cm/s LVOT Vmean:  68.700 cm/s LVOT VTI:    0.158 m  AORTA Ao Root diam: 2.58 cm Ao Asc diam:  3.00 cm MITRAL VALVE MV Area (PHT): 5.27 cm    SHUNTS MV Decel Time: 144 msec    Systemic VTI:  0.16 m MV E velocity: 55.10 cm/s  Systemic Diam: 2.14 cm MV A velocity: 92.00 cm/s MV E/A ratio:  0.60 Kardie Tobb DO Electronically signed by Dub Huntsman DO Signature Date/Time: 07/11/2024/9:46:08 AM    Final    DG CHEST PORT 1 VIEW Result Date: 07/10/2024 CLINICAL DATA:  10031 Cough 10031 EXAM: PORTABLE CHEST - 1 VIEW COMPARISON:  07/31/2017 FINDINGS: No focal airspace consolidation, pleural effusion, or pneumothorax. Mild  cardiomegaly.Aortic atherosclerosis.No acute fracture or destructive lesion. Multilevel thoracic osteophytosis. IMPRESSION: No acute cardiopulmonary abnormality. Electronically Signed   By: Rogelia Myers M.D.   On: 07/10/2024 16:26   MR BRAIN WO CONTRAST Result Date: 07/10/2024 EXAM: MRI BRAIN WITHOUT CONTRAST 07/10/2024 12:34:22 PM TECHNIQUE: Multiplanar multisequence MRI of the head/brain was performed without the administration of intravenous contrast. COMPARISON: Head CT and CTA 07/10/2024. CLINICAL HISTORY: Neuro deficit, acute, stroke suspected. Right sided weakness and facial droop. FINDINGS: BRAIN AND VENTRICLES: There is an acute infarct involving the posterior limb of the left internal capsule and left lentiform nucleus. Chronic lacunar infarcts are present in the basal ganglia bilaterally and in the left thalamus. No intracranial hemorrhage, mass, midline shift, hydrocephalus, or extra axial fluid collection is identified. T2 hyperintensities in the cerebral white matter bilaterally are nonspecific but compatible with mild chronic small vessel ischemic disease. Cerebral volume is within normal limits for age. Major intracranial vascular flow voids are preserved. ORBITS: No acute abnormality. SINUSES AND MASTOIDS: Small mucous retention cysts in the maxillary sinuses. Clear mastoid air cells. BONES AND SOFT TISSUES: Normal marrow signal. No acute soft tissue abnormality. IMPRESSION: 1. Acute infarct involving the left internal capsule/basal ganglia. 2. Chronic small vessel ischemic disease with multiple chronic lacunar infarcts. Electronically signed by: Dasie Hamburg MD 07/10/2024 12:57 PM EST RP Workstation: [id number]   CT HEAD CODE STROKE WO CONTRAST Addendum Date: 07/10/2024 ********  ADDENDUM #1 ******** ADDENDUM: The findings were discussed with Dr. Vanessa at 11:15 am 07/10/24. I was unable to obtain his contact information at the time of the report.  ---------------------------------------------------- Electronically signed by: Evalene Coho MD 07/10/2024 11:20 AM EST RP Workstation: [id number]   Result Date: 07/10/2024 ******** ORIGINAL REPORT ******** EXAM: CT HEAD WITHOUT CONTRAST 07/10/2024 10:22:12 AM TECHNIQUE: CT of the head was performed without the administration of intravenous contrast. Automated exposure control, iterative reconstruction, and/or weight based adjustment of the mA/kV was utilized to reduce the radiation dose to as low as reasonably achievable. COMPARISON: CT of the head dated 11/19/2008. CLINICAL HISTORY: Neuro deficit, acute, stroke suspected. FINDINGS: BRAIN AND VENTRICLES: No acute hemorrhage. No evidence of acute infarct. Remote basal ganglia lacunar infarcts. Partially empty sella. No hydrocephalus. No extra-axial collection. No mass effect or midline shift. Calcific atherosclerosis. [place] Stroke Program Early CT Score (ASPECTS): Ganglionic (caudate, internal capsule, lentiform nucleus, insula, M1-M3): 7 Supraganglionic (M4-M6): 3 Total: 10 ORBITS: No acute abnormality. SINUSES: Maxillary sinus retention cysts. SOFT TISSUES AND SKULL: No acute soft tissue abnormality. No skull fracture. IMPRESSION: 1. No acute intracranial abnormality related to the suspected stroke. 2. ASPECTS: 10. Electronically signed by: Evalene Coho MD 07/10/2024 10:42 AM EST RP Workstation: [id number]   CT ANGIO HEAD NECK W WO CM (CODE STROKE) Result Date: 07/10/2024 EXAM: CTA HEAD AND NECK WITH AND WITHOUT 07/10/2024 10:30:18 AM TECHNIQUE: CTA of the head and neck was performed with and without the administration of 75 mL of iohexol  (OMNIPAQUE ) 350 MG/ML injection. Multiplanar 2D and/or 3D reformatted images are provided for review. Automated exposure control, iterative reconstruction, and/or weight based adjustment of the mA/kV was utilized to reduce the radiation dose to as low as reasonably achievable. Stenosis of the internal carotid  arteries measured using NASCET criteria. COMPARISON: None available CLINICAL HISTORY: Neuro deficit, acute, stroke suspected. FINDINGS: CTA NECK: AORTIC ARCH AND ARCH VESSELS: Moderate calcific plaque within the aortic arch. No dissection or arterial injury. No significant stenosis of the brachiocephalic or subclavian arteries. CERVICAL CAROTID ARTERIES: Calcific plaque within the carotid bulbs bilaterally with less than 10% luminal stenosis bilaterally. The cervical segments of both internal carotid arteries are tortuous. No dissection, arterial injury, or hemodynamically significant stenosis by NASCET criteria. CERVICAL VERTEBRAL ARTERIES: No dissection, arterial injury, or significant stenosis. LUNGS AND MEDIASTINUM: Unremarkable. SOFT TISSUES: No acute abnormality. BONES: No acute abnormality. CTA HEAD: ANTERIOR CIRCULATION: Internal Carotid Arteries: Moderate-to-severe stenosis of the ascending petrous segment of the right internal carotid artery. Moderate stenosis of the supraclinoid segment of the right internal carotid artery. Mild-to-moderate calcific atheromatous disease and stenosis of the petrous and cavernous segments of the left internal carotid artery. Anterior Cerebral Arteries: The right A1 segment is diminutive or absent. No significant stenosis of the left anterior cerebral artery. Middle Cerebral Arteries: The M1 segments are mildly irregular in contour. No significant stenosis of the middle cerebral arteries. Large Vessel Occlusion: There is no evidence of emergent large vessel occlusion. Aneurysm: No aneurysm. POSTERIOR CIRCULATION: Posterior Cerebral Arteries: Mild irregular stenosis of the P1 segments of the posterior cerebral arteries bilaterally. Basilar Artery: No significant stenosis of the basilar artery. Vertebral Arteries: No significant stenosis of the vertebral arteries. Aneurysm: No aneurysm. OTHER: No dural venous sinus thrombosis on this non-dedicated study. IMPRESSION: 1. No  evidence of emergent large vessel occlusion. 2. 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 3. Mild-to-moderate calcific atheromatous disease and stenosis  of the petrous and cavernous segments of the left internal carotid artery. 4. Mild irregular stenosis of the P1 segments of the posterior cerebral arteries bilaterally. Electronically signed by: Evalene Coho MD 07/10/2024 10:53 AM EST RP Workstation: [id number]    Labs:  Basic Metabolic Panel: Recent Labs  Lab 07/20/24 0441  NA 137  K 5.0  CL 99  CO2 25  GLUCOSE 126*  BUN 23  CREATININE 0.69  CALCIUM  9.2    CBC: Recent Labs  Lab 07/20/24 0441  WBC 8.3  NEUTROABS 4.6  HGB 15.1*  HCT 44.5  MCV 96.7  PLT 378    CBG: Recent Labs  Lab 07/25/24 0619 07/25/24 1141 07/25/24 1622 07/25/24 2021 07/26/24 0546  GLUCAP 112* 109* 113* 190* 117*    Brief HPI:   Darlene Pugh is a 63 y.o. right-handed female with history significant for depression, hyperlipidemia, alcohol use, tobacco use, hypertension, diabetes mellitus as well as history of CVA with residual left facial numbness and maintained on aspirin  as well as Brilinta .  Per chart review patient lives alone.  Independent driving prior to admission.  1 level home 4 steps to entry.  Presented 07/10/2024 after being found down on the ground by her property manager with facial droop dysarthria and weakness.  She endorses 2 recent fall without loss of consciousness.  Blood pressure on admission 160/99.  CTA showed no evidence of emergent large vessel occlusion.  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  MRI showed acute infarct involving the left internal capsule/basal ganglia.  Patient did not receive TNK.  LVO negative.  Admission chemistries unremarkable  except potassium 3.4 glucose 218 alcohol negative total CK of 172, hemoglobin A1c 9.5.  Echocardiogram ejection fraction of 55 to 60% no wall motion abnormalities.  Neurology follow-up maintained on aspirin  81 mg daily and Plavix  75 mg daily for 3 weeks then Plavix  alone confirmed with neurology services.  She was placed on Xarelto  10 mg daily for 30 days for VTE prophylaxis.  She did have a urine culture greater than 100,000 colony of yeast completing a 3-day course of Rocephin .  Patient reports she had left knee pain due to the fall before admission.  Appears she was treated with Ace wrap as well as ice and Toradol .  X-ray with mild lateral compartment degeneration arthritis and moderate joint effusion.  Therapy evaluations completed due to patient decreased functional mobility was admitted for a comprehensive rehab program.   Hospital Course: Darlene Pugh was admitted to rehab 07/19/2024 for inpatient therapies to consist of PT, ST and OT at least three hours five days a week. Past admission physiatrist, therapy team and rehab RN have worked together to provide customized collaborative inpatient rehab.  Pertaining to patient's left internal capsule basal ganglia infarction remained stable she will continue aspirin  and Plavix  x 3 weeks then Plavix  alone.  Xarelto  was added for 30 days for DVT prophylaxis.  Left knee pain conservative care x-rays indicate mild effusion osteoarthritis she was using Voltaren  gel.  Blood pressure remained well-controlled on Norvasc  as well as Cozaar  monitoring with increased mobility.  Blood sugars controlled hemoglobin A1c 9.5 Glucophage  as well as insulin  as directed with diabetic teaching.  Lipitor  and Zetia  ongoing for hyperlipidemia.  She did have a history of tobacco and alcohol use a NicoDerm patch was added receiving counts regards cessation nicotine  as well as alcohol.  Bouts of constipation resolved  with laxative assistance.   Blood pressures were monitored  on TID basis and remained controlled and monitored  Diabetes has been monitored with ac/hs CBG checks and SSI was use prn for tighter BS control.    Rehab course: During patient's stay in rehab weekly team conferences were held to monitor patient's progress, set goals and discuss barriers to discharge. At admission, patient required mod max assist for ambulation 15 feet rolling walker contact-guard sit to stand  He/She  has had improvement in activity tolerance, balance, postural control as well as ability to compensate for deficits. He/She has had improvement in functional use RUE/LUE  and RLE/LLE as well as improvement in awareness.  Working with energy conservation techniques.  Perform sit to stand and stand pivot transfers throughout session with contact-guard/minimal assist.  Some verbal cues for technique.  Ambulates 40 feet rolling walker contact-guard with close wheelchair follow.  Working with motor control coordination balance and sequencing during ambulation.  Transitions to edge of bed using bed rail with supervision and increased time.  Stand pivot edge of bed wheelchair using rolling walker contact-guard.  She completed stand pivot transfer wheelchair to T TB using grab bar and contact-guard.  Doffed overhead shirt seated supervision and brief contact-guard in standing.  Completed entire shower in seated position for safety and energy conservation.  She completed lateral leans to wash buttocks with contact-guard for dynamic sitting balance completing shower and overall contact-guard level.  Following shower she dried herself with minimal assist to dry buttocks and completed dressing while seated wheelchair.  Donned overhead gown with supervision and brief donned with moderate assist.  SLP follow-up patient oriented to self location and month/year independently however required moderate assist to recall reasons for admission.  Patient somewhat flat throughout sessions requiring consistent  encouragement to participate in cognitive tasks.  SLP facilitated completion of medication management task to encourage use of problem-solving memory and organizational skills.  It was discussed with family need for 24-hour supervision on discharge.  Full family teaching completed plan discharge to home       Disposition:  There are no questions and answers to display.         Diet: Diabetic diet  Special Instructions: No driving smoking or alcohol  Medications at discharge. 1.  Tylenol  as needed 2.  Norvasc  2.5mg  p.o. daily 3.  Plavix  75 mg daily 4.  Lipitor  80 mg p.o. daily 5.  Xarelto  10 mg daily 08/16/2024 6.  Farxiga  10 mg p.o. daily 7.  Voltaren  gel 2 g 4 times daily 8.  Zetia  10 mg p.o. daily 9.  Protonix  40 mg daily 10.  Folic acid  1 mg p.o. daily 11.  Lantus  insulin  14 units daily 12.  Lidocaine  patch changes directed 13.  Cozaar  25 mg p.o. daily 14.  Glucophage  500 mg p.o. daily with breakfast 15.  Multivitamin daily 16.  NicoDerm patch taper as directed 17.  MiraLAX  daily hold for loose stools 18.  Melatonin 5 mg nightly as needed 19.  Thiamine  100 mg daily 20.  Nitroglycerin  as needed  30-35 minutes were spent completing discharge summary and discharge planning  Discharge Instructions     Ambulatory referral to Neurology   Complete by: As directed    An appointment is requested in approximately: 4 weeks left basal ganglia infarction   Ambulatory referral to Physical Medicine Rehab   Complete by: As directed    Moderate complexity follow-up 1 to 2 weeks left basal ganglia infarction  Follow-up Information     Jaleel Allen, Prentice BRAVO, MD Follow up.   Specialty: Physical Medicine and Rehabilitation Why: Office to call for appointment Contact information: [address] [place] [address] [place] [address] [phone]         Earvin Johnston PARAS, FNP Follow up.   Specialty: Family Medicine Why: Call for appointment Contact information: [address] [place] [address] [phone]                 Signed: Toribio PARAS Pitch 07/26/2024, 10:57 AM

## 2024-07-19 NOTE — Plan of Care (Signed)

## 2024-07-19 NOTE — Plan of Care (Signed)
°  Problem: Consults Goal: RH STROKE PATIENT EDUCATION Description: See Patient Education module for education specifics  07/19/2024 1626 by Melvyn Aleck LABOR, RN Outcome: Progressing 07/19/2024 1625 by Melvyn Aleck LABOR, RN Outcome: Progressing Goal: Diabetes Guidelines if Diabetic/Glucose > 140 Description: If diabetic or lab glucose is > 140 mg/dl - Initiate Diabetes/Hyperglycemia Guidelines & Document Interventions  07/19/2024 1626 by Melvyn Aleck LABOR, RN Outcome: Progressing 07/19/2024 1625 by Melvyn Aleck LABOR, RN Outcome: Progressing   Problem: RH BOWEL ELIMINATION Goal: RH STG MANAGE BOWEL WITH ASSISTANCE Description: STG Manage Bowel with min Assistance. 07/19/2024 1626 by Melvyn Aleck LABOR, RN Outcome: Progressing 07/19/2024 1625 by Melvyn Aleck LABOR, RN Outcome: Progressing   Problem: RH BLADDER ELIMINATION Goal: RH STG MANAGE BLADDER WITH ASSISTANCE Description: STG Manage Bladder With min Assistance 07/19/2024 1626 by Melvyn Aleck LABOR, RN Outcome: Progressing 07/19/2024 1625 by Melvyn Aleck LABOR, RN Outcome: Progressing   Problem: RH SKIN INTEGRITY Goal: RH STG SKIN FREE OF INFECTION/BREAKDOWN Description: Manage skin free of infection with min assistance 07/19/2024 1626 by Melvyn Aleck LABOR, RN Outcome: Progressing 07/19/2024 1625 by Melvyn Aleck LABOR, RN Outcome: Progressing   Problem: RH SAFETY Goal: RH STG ADHERE TO SAFETY PRECAUTIONS W/ASSISTANCE/DEVICE Description: STG Adhere to Safety Precautions With min Assistance/Device. 07/19/2024 1626 by Melvyn Aleck LABOR, RN Outcome: Progressing 07/19/2024 1625 by Melvyn Aleck LABOR, RN Outcome: Progressing   Problem: RH PAIN MANAGEMENT Goal: RH STG PAIN MANAGED AT OR BELOW PT'S PAIN GOAL Description: <4 w/ prns 07/19/2024 1626 by Melvyn Aleck LABOR, RN Outcome: Progressing 07/19/2024 1625 by Melvyn Aleck LABOR, RN Outcome: Progressing   Problem: RH KNOWLEDGE  DEFICIT Goal: RH STG INCREASE KNOWLEDGE OF DIABETES Description: Manage increase knowledge diabetes with min assistance from daughter using educational materials provided 07/19/2024 1626 by Melvyn Aleck LABOR, RN Outcome: Progressing 07/19/2024 1625 by Melvyn Aleck LABOR, RN Outcome: Progressing Goal: RH STG INCREASE KNOWLEDGE OF HYPERTENSION Description: Manage increase knowledge of hypertension   with min assistance from daughter using educational materials provided 07/19/2024 1626 by Melvyn Aleck LABOR, RN Outcome: Progressing 07/19/2024 1625 by Melvyn Aleck LABOR, RN Outcome: Progressing Goal: RH STG INCREASE KNOWLEGDE OF HYPERLIPIDEMIA Description: Manage increase knowledge of hyperlipidemia  with min assistance from daughter using educational materials provided 07/19/2024 1626 by Melvyn Aleck LABOR, RN Outcome: Progressing 07/19/2024 1625 by Melvyn Aleck LABOR, RN Outcome: Progressing Goal: RH STG INCREASE KNOWLEDGE OF STROKE PROPHYLAXIS 07/19/2024 1626 by Melvyn Aleck LABOR, RN Outcome: Progressing 07/19/2024 1625 by Melvyn Aleck LABOR, RN Outcome: Progressing

## 2024-07-19 NOTE — Discharge Instructions (Addendum)
 Inpatient Rehab Discharge Instructions  [facility] Discharge date and time: No discharge date for patient encounter.   Activities/Precautions/ Functional Status: Activity: activity as tolerated Diet: diabetic diet Wound Care: Routine skin checks Functional status:  ___ No restrictions     ___ Walk up steps independently ___ 24/7 supervision/assistance   ___ Walk up steps with assistance ___ Intermittent supervision/assistance  ___ Bathe/dress independently ___ Walk with walker     _x__ Bathe/dress with assistance ___ Walk Independently    ___ Shower independently ___ Walk with assistance    ___ Shower with assistance ___ No alcohol     ___ Return to work/school ________  Special Instructions: No driving smoking or alcohol      COMMUNITY REFERRALS UPON DISCHARGE:    Home Health:   PT    OT     SP                 Agency:[facility]   Phone:[phone]   Medical Equipment/Items Ordered:                                                 Agency/Supplier:  [employer] COMMUNITY TRANSPORTATION: [phone] TO SCHEDULE A RIDE FOR APPOINTMENTS     STROKE/TIA DISCHARGE INSTRUCTIONS SMOKING Cigarette smoking nearly doubles your risk of having a stroke & is the single most alterable risk factor  If you smoke or have smoked in the last 12 months, you are advised to quit smoking for your health. Most of the excess cardiovascular risk related to smoking disappears within a year of stopping. Ask you doctor about anti-smoking medications [place] Quit Line: 1-800-QUIT NOW Free Smoking Cessation Classes [phone]  CHOLESTEROL Know your levels; limit fat & cholesterol in your diet  Lipid Panel     Component Value Date/Time   CHOL 277 (H) 07/11/2024 0237   TRIG 435 (H) 07/11/2024 0237   HDL 49 07/11/2024 0237   CHOLHDL 5.7 07/11/2024 0237   VLDL UNABLE TO CALCULATE IF TRIGLYCERIDE OVER 400 mg/dL 87/97/7974 9762   LDLCALC UNABLE TO CALCULATE IF TRIGLYCERIDE OVER 400 mg/dL  87/97/7974 9762     Many patients benefit from treatment even if their cholesterol is at goal. Goal: Total Cholesterol (CHOL) less than 160 Goal:  Triglycerides (TRIG) less than 150 Goal:  HDL greater than 40 Goal:  LDL (LDLCALC) less than 100   BLOOD PRESSURE American Stroke Association blood pressure target is less that 120/80 mm/Hg  Your discharge blood pressure is:  BP: 94/76 Monitor your blood pressure Limit your salt and alcohol intake Many individuals will require more than one medication for high blood pressure  DIABETES (A1c is a blood sugar average for last 3 months) Goal HGBA1c is under 7% (HBGA1c is blood sugar average for last 3 months)  Diabetes:   Lab Results  Component Value Date   HGBA1C 9.5 (H) 07/10/2024    Your HGBA1c can be lowered with medications, healthy diet, and exercise. Check your blood sugar as directed by your physician Call your physician if you experience unexplained or low blood sugars.  PHYSICAL ACTIVITY/REHABILITATION Goal is 30 minutes at least 4 days per week  Activity: Increase activity slowly, Therapies: Physical Therapy: Home Health Return to work:  Activity decreases your risk of heart attack and stroke and makes your heart stronger.  It helps control your weight  and blood pressure; helps you relax and can improve your mood. Participate in a regular exercise program. Talk with your doctor about the best form of exercise for you (dancing, walking, swimming, cycling).  DIET/WEIGHT Goal is to maintain a healthy weight  Your discharge diet is:  Diet Order             Diet Carb Modified Fluid consistency: Thin; Room service appropriate? Yes with Assist  Diet effective now                   liquids Your height is:  Height: 5' 4 (162.6 cm) Your current weight is: Weight: 74 kg Your Body Mass Index (BMI) is:  BMI (Calculated): 27.99 Following the type of diet specifically designed for you will help prevent another stroke. Your goal  weight range is:   Your goal Body Mass Index (BMI) is 19-24. Healthy food habits can help reduce 3 risk factors for stroke:  High cholesterol, hypertension, and excess weight.  RESOURCES Stroke/Support Group:  Call [phone]   STROKE EDUCATION PROVIDED/REVIEWED AND GIVEN TO PATIENT Stroke warning signs and symptoms How to activate emergency medical system (call 911). Medications prescribed at discharge. Need for follow-up after discharge. Personal risk factors for stroke. Pneumonia vaccine given: No Flu vaccine given: No My questions have been answered, the writing is legible, and I understand these instructions.  I will adhere to these goals & educational materials that have been provided to me after my discharge from the hospital.      My questions have been answered and I understand these instructions. I will adhere to these goals and the provided educational materials after my discharge from the hospital.  Patient/Caregiver Signature _______________________________ Date __________  Clinician Signature _______________________________________ Date __________  Please bring this form and your medication list with you to all your follow-up doctor's appointments.

## 2024-07-19 NOTE — Progress Notes (Signed)
 Inpatient Rehabilitation Admission Medication Review by a Pharmacist  A complete drug regimen review was completed for this patient to identify any potential clinically significant medication issues.  High Risk Drug Classes Is patient taking? Indication by Medication  Antipsychotic No   Anticoagulant Yes Xarelto  - VTE ppx  Antibiotic No   Opioid No   Antiplatelet Yes Aspirin , clopidogrel  x 3 weeks then aspirin  alone - CVA  Hypoglycemics/insulin  Yes Dapagliflozin , insulin , metformin  - DM, CHF  Vasoactive Medication Yes Amlodipine , losartan  - HTN  Chemotherapy No   Other Yes Atorvastatin , ezetimibe - HLD Duoneb prn SOB  Diclofenac  gel, lidocaine  patch- pain  Famotidine  - reflux Folic acid , thiamine - supplement  Nicotine  patch - smoking cessation     Type of Medication Issue Identified Description of Issue Recommendation(s)  Drug Interaction(s) (clinically significant)     Duplicate Therapy     Allergy     No Medication Administration End Date     Incorrect Dose     Additional Drug Therapy Needed     Significant med changes from prior encounter (inform family/care partners about these prior to discharge).    Other       Clinically significant medication issues were identified that warrant physician communication and completion of prescribed/recommended actions by midnight of the next day:  No  Name of provider notified for urgent issues identified:   Provider Method of Notification:   Pharmacist comments: None  Time spent performing this drug regimen review (minutes):  20 minutes  Thank you. Olam Monte, PharmD

## 2024-07-19 NOTE — Progress Notes (Signed)
 Darlene Albright, MD  Physician Physical Medicine and Rehabilitation   PMR Pre-admission     Signed   Date of Service: 07/18/2024  8:31 AM  Related encounter: ED to Hosp-Admission (Discharged) from 07/10/2024 in [facility]   Signed     Expand All Collapse All  Show:Clear all [x] Written[x] Templated[x] Copied  Added by: [x] Darlene Heron MATSU, RN[x] Darlene Albright, MD  [] Hover for details PMR Admission Coordinator Pre-Admission Assessment   Patient: Darlene Pugh is an 63 y.o., female MRN: [id number] DOB: 10-05-1960 Height: 5' 4 (162.6 cm) Weight: 76.2 kg   Insurance Information HMO:     PPO:      PCP:      IPA:      80/20:      OTHER:  PRIMARY: [place] Medicaid [employer]      Policy#: [id number]      Subscriber: pt CM Name: Darlene Pugh      Phone#: [phone]     Fax#: [phone] or email [email or web address] Pre-Cert#: [id number]  shara for CIR from [place] with [place] Medicaid [facility] for admit 12/10 with next review date in 6 days form admit 12/16.  Updates due to [facility] at fax listed above.        Employer:  Benefits:  Phone #: [phone]     Name: 12/9 Eff. Date: 02/08/2024 until 02/06/25     Deduct: none      Out of Pocket Max: none      Life Max: none CIR: 100% per medicaid guidelines      SNF: 100% for 90 days per medicaid Outpatient: 100% 27 visits combined     Co-Pay:  Home Health: 100%      Co-Pay: 100 visits total DME: 100%     Co-Pay: per medical neccesity Providers: in network  SECONDARY: none      Policy#:      Phone#:    Artist:       Phone#:    The Best Boy for patients in Inpatient Rehabilitation Facilities with attached Privacy Act Statement-Health Care Records was provided and verbally reviewed with: Patient and Family   Emergency Contact Information Contact Information       Name Relation Home Work Mobile    [place]  Daughter     [phone]    [name] Daughter     [phone]    [name], [name]     [phone]         Other Contacts       Name Relation Home Work Mobile    [name],[name] Daughter     [phone]         Current Medical History  Patient Admitting Diagnosis: CVA   History of Present Illness:  Darlene Pugh is a 63 year old right-handed female with history significant for depression, hyperlipidemia, alcohol use, hypertension, diabetes mellitus as well as tobacco use as well as history of CVA with residual left facial numbness maintained on aspirin  as well as Brilinta .  Presented 07/10/2024 after being found down on the ground by her property manager with facial droop dysarthria and weakness.  She endorses 2 recent falls without loss of consciousness.     Blood pressure on admission 160/99.  CTA showed no evidence of emergent large vessel occlusion.  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  MRI showed acute infarct involving the left internal capsule/basal ganglia.  Patient did not receive TNK.  LVO negative.  Admission chemistries unremarkable except potassium 3.4 glucose 218, alcohol negative, total CK of 172, hemoglobin A1c 9.5.  Echocardiogram ejection fraction of 55 to 60% no wall motion abnormalities.  Neurology follow-up currently maintained on aspirin  81 mg daily and Plavix  75 mg daily for 3 weeks then Plavix  alone.  She has been placed on Xarelto  10 mg daily for 30 days for VTE prophylaxis.  She did have a urine culture greater than 100,000 colonies yeast and completed 3-day course of Rocephin .  Her diet has been advanced to a regular consistency    Complete NIHSS TOTAL: 4   Patient's medical record from [facility] has been reviewed by the rehabilitation admission coordinator and physician.   Past Medical History      Past Medical History:   Diagnosis Date   Diabetes mellitus without complication (HCC)     Hypertension          Has the patient had major surgery during 100 days prior to admission? No   Family History   family history is not on file.   Current Medications  Current Medications    Current Facility-Administered Medications:    acetaminophen  (TYLENOL ) tablet 500 mg, 500 mg, Oral, Q6H PRN, Bender, Emily, DO   amLODipine  (NORVASC ) tablet 5 mg, 5 mg, Oral, Daily, Koomson, Julius, MD, 5 mg at 07/19/24 0911   aspirin  chewable tablet 81 mg, 81 mg, Oral, Daily, Azadegan, Maryam, MD, 81 mg at 07/19/24 0911   atorvastatin  (LIPITOR ) tablet 80 mg, 80 mg, Oral, Daily, King, Olivia, DO, 80 mg at 07/19/24 0911   clopidogrel  (PLAVIX ) tablet 75 mg, 75 mg, Oral, Daily, Chen, Lydia D, RPH, 75 mg at 07/19/24 9088   dapagliflozin  propanediol (FARXIGA ) tablet 10 mg, 10 mg, Oral, Daily, King, Olivia, DO, 10 mg at 07/19/24 9087   diclofenac  Sodium (VOLTAREN ) 1 % topical gel 2 g, 2 g, Topical, QID, Nooruddin, Saad, MD, 2 g at 07/19/24 9086   ezetimibe  (ZETIA ) tablet 10 mg, 10 mg, Oral, Daily, King, Olivia, DO, 10 mg at 07/19/24 0911   famotidine  (PEPCID ) tablet 40 mg, 40 mg, Oral, Daily, King, Olivia, DO, 40 mg at 07/19/24 0911   feeding supplement (GLUCERNA SHAKE) (GLUCERNA SHAKE) liquid 237 mL, 237 mL, Oral, TID BM, King, Olivia, DO, 237 mL at 07/19/24 0913   folic acid  (FOLVITE ) tablet 1 mg, 1 mg, Oral, Daily, King, Olivia, DO, 1 mg at 07/19/24 0911   insulin  aspart (novoLOG ) injection 0-15 Units, 0-15 Units, Subcutaneous, TID WC, Bender, Emily, DO, 2 Units at 07/19/24 9364   insulin  aspart (novoLOG ) injection 0-5 Units, 0-5 Units, Subcutaneous, QHS, Bender, Emily, DO, 2 Units at 07/18/24 2141   insulin  aspart (novoLOG ) injection 3 Units, 3 Units, Subcutaneous, TID WC, King, Olivia, DO, 3 Units at 07/19/24 0913   insulin  glargine (LANTUS ) injection 14 Units, 14 Units, Subcutaneous, Daily, King, Olivia, DO, 14 Units at 07/19/24  0913   ipratropium-albuterol  (DUONEB) 0.5-2.5 (3) MG/3ML nebulizer solution 3 mL, 3 mL, Nebulization, Q4H PRN, Bender, Emily, DO   lidocaine  (LIDODERM ) 5 % 1 patch, 1 patch, Transdermal, Daily, Azadegan, Maryam, MD, 1 patch at 07/19/24 0912   losartan  (COZAAR ) tablet 25 mg, 25 mg, Oral, Daily, King, Olivia, DO, 25 mg at 07/19/24 0911   metFORMIN  (GLUCOPHAGE -XR) 24 hr tablet 500 mg, 500 mg, Oral, Q breakfast, King, Olivia, DO, 500 mg at 07/19/24 9087   multivitamin with minerals tablet 1 tablet, 1 tablet, Oral, Daily, Kandis Perkins,  DO, 1 tablet at 07/19/24 0911   nicotine  (NICODERM CQ  - dosed in mg/24 hours) patch 21 mg, 21 mg, Transdermal, Daily, Bender, Emily, DO, 21 mg at 07/19/24 0913   polyethylene glycol (MIRALAX  / GLYCOLAX ) packet 17 g, 17 g, Oral, Daily, King, Olivia, DO, 17 g at 07/19/24 0911   rivaroxaban  (XARELTO ) tablet 10 mg, 10 mg, Oral, Daily, King, Olivia, DO, 10 mg at 07/19/24 0911   senna-docusate (Senokot-S) tablet 1 tablet, 1 tablet, Oral, BID, King, Olivia, DO, 1 tablet at 07/19/24 0911   thiamine  (VITAMIN B1) tablet 100 mg, 100 mg, Oral, Daily, 100 mg at 07/19/24 0911 **OR** thiamine  (VITAMIN B1) injection 100 mg, 100 mg, Intravenous, Daily, Bender, Emily, DO, 100 mg at 07/11/24 1058     Patients Current Diet:  Diet Order                  Diet Carb Modified Fluid consistency: Thin; Room service appropriate? Yes with Assist  Diet effective now                       Precautions / Restrictions Precautions Precautions: Fall Restrictions Weight Bearing Restrictions Per Provider Order: No    Has the patient had 2 or more falls or a fall with injury in the past year? yes   Prior Activity Level Community (5-7x/wk): indpendent with and without AD, driving, I with adls   Prior Functional Level Self Care: Did the patient need help bathing, dressing, using the toilet or eating? Independent   Indoor Mobility: Did the patient need assistance with walking from room to  room (with or without device)? Independent   Stairs: Did the patient need assistance with internal or external stairs (with or without device)? Independent   Functional Cognition: Did the patient need help planning regular tasks such as shopping or remembering to take medications? Independent   Patient Information Are you of Hispanic, Latino/a,or Spanish origin?: A. No, not of Hispanic, Latino/a, or Spanish origin What is your race?: B. Black or African American Do you need or want an interpreter to communicate with a doctor or health care staff?: 0. No   Patient's Response To:  Health Literacy and Transportation Is the patient able to respond to health literacy and transportation needs?: Yes Health Literacy - How often do you need to have someone help you when you read instructions, pamphlets, or other written material from your doctor or pharmacy?: Never In the past 12 months, has lack of transportation kept you from medical appointments or from getting medications?: No In the past 12 months, has lack of transportation kept you from meetings, work, or from getting things needed for daily living?: No   Journalist, Newspaper / Equipment Home Equipment: Information systems manager, Standard Walker, Medical Laboratory Scientific Officer - single point   Prior Device Use: Indicate devices/aids used by the patient prior to current illness, exacerbation or injury? None of the above   Current Functional Level Cognition   Arousal/Alertness: Awake/alert Overall Cognitive Status: Impaired/Different from baseline Orientation Level: Oriented X4 Attention: Sustained Sustained Attention: Appears intact Memory: Impaired Memory Impairment:  (0/4 recall, severe impairment) Awareness: Impaired Awareness Impairment: Intellectual impairment, Emergent impairment Problem Solving:  (functional for verbal tasks) Safety/Judgment: Other (comment) (needs supervision initially)    Extremity Assessment (includes Sensation/Coordination)   Upper  Extremity Assessment: Left hand dominant, RUE deficits/detail RUE Deficits / Details: increased grip strength and able to use with grooming tasks as gross assist RUE Sensation: decreased proprioception, decreased light touch  RUE Coordination: decreased fine motor, decreased gross motor  Lower Extremity Assessment: Defer to PT evaluation     ADLs   Overall ADL's : Needs assistance/impaired Eating/Feeding: Minimal assistance, Sitting Eating/Feeding Details (indicate cue type and reason): assistance with opening containers Grooming: Wash/dry hands, Wash/dry face, Oral care, Supervision/safety, Sitting Grooming Details (indicate cue type and reason): able to use RUE to hold toothbrush when applying toothpaste Upper Body Bathing: Moderate assistance, Sitting Lower Body Bathing: Maximal assistance, Sit to/from stand Lower Body Bathing Details (indicate cue type and reason): stood in [place] for peri area bathing Upper Body Dressing : Moderate assistance, Sitting Lower Body Dressing: Maximal assistance, Sitting/lateral leans, Sit to/from stand Lower Body Dressing Details (indicate cue type and reason): to donn socks Toilet Transfer: Moderate assistance, +2 for physical assistance, +2 for safety/equipment, Stand-pivot Functional mobility during ADLs: Moderate assistance, +2 for safety/equipment, +2 for physical assistance General ADL Comments: increased RUE use with gross assist with self care     Mobility   Overal bed mobility: Needs Assistance Bed Mobility: Sit to Supine, Supine to Sit Supine to sit: Contact guard, Used rails, HOB elevated Sit to supine: Contact guard assist, Used rails, HOB elevated General bed mobility comments: CGA for safety. Pt used rails     Transfers   Overall transfer level: Needs assistance Equipment used: Rolling walker (2 wheels) Transfers: Sit to/from Stand Sit to Stand: Contact guard assist Bed to/from chair/wheelchair/BSC transfer type:: Via Lift  equipment Step pivot transfers: +2 physical assistance, Mod assist Transfer via Lift Equipment: Stedy General transfer comment: Pt stood on her own today with no physical assistance.     Ambulation / Gait / Stairs / Wheelchair Mobility   Ambulation/Gait Ambulation/Gait assistance: +2 physical assistance, +2 safety/equipment, Min assist, Mod assist, Max assist Gait Distance (Feet): 15 Feet Assistive device: Rolling walker (2 wheels) Gait Pattern/deviations: Knees buckling, Trunk flexed, Wide base of support, Ataxic, Decreased stride length, Step-through pattern General Gait Details: Pt today able to ambulate to door and back with RW. 1 seated rest break. Once at door pt noted with significant knee buckling requiring +2 Mod/Max to correct. Mostly +2 Min A otherwise. Would benefit from close chair follow next session. Gait velocity: decreased     Posture / Balance Dynamic Sitting Balance Sitting balance - Comments: CGA to supervision Balance Overall balance assessment: Needs assistance Sitting-balance support: Bilateral upper extremity supported, Feet supported Sitting balance-Leahy Scale: Fair Sitting balance - Comments: CGA to supervision Postural control: Right lateral lean Standing balance support: Bilateral upper extremity supported, During functional activity, Reliant on assistive device for balance Standing balance-Leahy Scale: Poor Standing balance comment: Reliant on therapists     Special considerations/life events  Hgb A1c 9.2 Smoker ETOH abuse    Previous Home Environment  Living Arrangements: Alone  Lives With: Alone Available Help at Discharge: Family, Available PRN/intermittently (dtr) Type of Home: Apartment Home Layout: One level Home Access: Stairs to enter Entrance Stairs-Rails: Right, Left, Can reach both Entrance Stairs-Number of Steps: 4 Bathroom Shower/Tub: Engineer, Manufacturing Systems: Standard Bathroom Accessibility: Yes How Accessible: Accessible  via walker Home Care Services: No   Discharge Living Setting Plans for Discharge Living Setting: Patient's home Type of Home at Discharge: Apartment Discharge Home Layout: One level Discharge Home Access: Stairs to enter Entrance Stairs-Rails: Can reach both Entrance Stairs-Number of Steps: 5 Discharge Bathroom Shower/Tub: Tub/shower unit Discharge Bathroom Toilet: Standard Discharge Bathroom Accessibility: Yes How Accessible: Accessible via walker Does the patient have any problems obtaining your medications?:  No   Social/Family/Support Systems Patient Roles: Parent Contact Information: Sharene, daughter Anticipated Caregiver's Contact Information: [phone] Caregiver Availability: 24/7 Discharge Plan Discussed with Primary Caregiver: Yes Is Caregiver In Agreement with Plan?: Yes Does Caregiver/Family have Issues with Lodging/Transportation while Pt is in Rehab?: No   Latoya is main contact. She and family will make a schedule to provide 24/7 physical care of patient in Patient's home at discharge. They all work but have differing schedules to accommodate.   Goals Patient/Family Goal for Rehab: min assist with PT and OT, possibly w/c level, supervision with SLP Expected length of stay: ELOS 2 weeks Pt/Family Agrees to Admission and willing to participate: Yes Program Orientation Provided & Reviewed with Pt/Caregiver Including Roles  & Responsibilities: Yes   Decrease burden of Care through IP rehab admission: n/a   Possible need for SNF placement upon discharge: not anticipated   Patient Condition: I have reviewed medical records from [facility], spoken with CM, and daughter and family member. I met with patient at the bedside and discussed via phone for inpatient rehabilitation assessment.  Patient will benefit from ongoing PT, OT, and SLP, can actively participate in 3 hours of therapy a day 5 days of the week, and can make measurable gains during the admission.   Patient will also benefit from the coordinated team approach during an Inpatient Acute Rehabilitation admission.  The patient will receive intensive therapy as well as Rehabilitation physician, nursing, social worker, and care management interventions.  Due to bladder management, bowel management, safety, skin/wound care, disease management, medication administration, pain management, and patient education the patient requires 24 hour a day rehabilitation nursing.  The patient is currently mod assist with mobility and basic ADLs.  Discharge setting and therapy post discharge at home with home health is anticipated.  Patient has agreed to participate in the Acute Inpatient Rehabilitation Program and will admit today.   Preadmission Screen Completed By:  Darlene Heron Lot, RN MSN 07/19/2024 10:20 AM ______________________________________________________________________   Discussed status with Dr. Urbano on 07/19/24 at 1022 and received approval for admission today.   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 Yes Co-Morbidities requiring supervision/potential complications: HTN, tobacco use, HLD, HTN, DM Due to bladder management, bowel management, safety, skin/wound care, disease management, medication administration, pain management, and patient education, does the patient require 24 hr/day rehab nursing? Yes Does the patient require coordinated care of a physician, rehab nurse, PT, OT, and SLP to address physical and functional deficits in the context of the above medical diagnosis(es)? Yes Addressing deficits in the following areas: balance, endurance, locomotion,  strength, transferring, bowel/bladder control, bathing, dressing, feeding, grooming, toileting, cognition, speech, language, and psychosocial support Can the patient actively participate in an intensive therapy program of at least 3 hrs of therapy 5 days a week? Yes The potential for patient to make measurable gains while on inpatient rehab is excellent Anticipated functional outcomes upon discharge from inpatient rehab: min assist PT, min assist OT, supervision SLP Estimated rehab length of stay to reach the above functional goals is: 2 weeks Anticipated discharge destination: Home 10. Overall Rehab/Functional Prognosis: excellent     MD Signature: Murray Darlene  Revision History  Date/Time User Provider Type Action  07/19/2024 10:47 AM Darlene Albright, MD Physician Sign  07/19/2024 10:22 AM Darlene Heron MATSU, RN Rehab Admission Coordinator Share  07/19/2024  9:53 AM Darlene Heron MATSU, RN Rehab Admission Coordinator Share  07/19/2024  9:44 AM Darlene Heron MATSU, RN Rehab Admission Coordinator Share  07/19/2024  8:58 AM Darlene Heron MATSU, RN Rehab Admission Coordinator Share  07/18/2024  3:02 PM Darlene Heron MATSU, RN Rehab Admission Coordinator Share  07/18/2024  8:31 AM Darlene Heron MATSU, RN Rehab Admission Coordinator Share  07/14/2024  4:39 PM Darlene Heron MATSU, RN Rehab Admission Coordinator Share   View Details Report

## 2024-07-20 DIAGNOSIS — I69351 Hemiplegia and hemiparesis following cerebral infarction affecting right dominant side: Secondary | ICD-10-CM

## 2024-07-20 LAB — COMPREHENSIVE METABOLIC PANEL WITH GFR
ALT: 17 U/L (ref 0–44)
AST: 26 U/L (ref 15–41)
Albumin: 3.1 g/dL — ABNORMAL LOW (ref 3.5–5.0)
Alkaline Phosphatase: 88 U/L (ref 38–126)
Anion gap: 13 (ref 5–15)
BUN: 23 mg/dL (ref 8–23)
CO2: 25 mmol/L (ref 22–32)
Calcium: 9.2 mg/dL (ref 8.9–10.3)
Chloride: 99 mmol/L (ref 98–111)
Creatinine, Ser: 0.69 mg/dL (ref 0.44–1.00)
GFR, Estimated: >60 mL/min (ref >60)
Glucose, Bld: 126 mg/dL — ABNORMAL HIGH (ref 70–99)
Potassium: 5 mmol/L (ref 3.5–5.1)
Sodium: 137 mmol/L (ref 135–145)
Total Bilirubin: 1.4 mg/dL — ABNORMAL HIGH (ref 0.0–1.2)
Total Protein: 6.8 g/dL (ref 6.5–8.1)

## 2024-07-20 LAB — CBC WITH DIFFERENTIAL/PLATELET
Abs Immature Granulocytes: 0.03 K/uL (ref 0.00–0.07)
Basophils Absolute: 0.1 K/uL (ref 0.0–0.1)
Basophils Relative: 1 %
Eosinophils Absolute: 0.2 K/uL (ref 0.0–0.5)
Eosinophils Relative: 2 %
HCT: 44.5 % (ref 36.0–46.0)
Hemoglobin: 15.1 g/dL — ABNORMAL HIGH (ref 12.0–15.0)
Immature Granulocytes: 0 %
Lymphocytes Relative: 32 %
Lymphs Abs: 2.6 K/uL (ref 0.7–4.0)
MCH: 32.8 pg (ref 26.0–34.0)
MCHC: 33.9 g/dL (ref 30.0–36.0)
MCV: 96.7 fL (ref 80.0–100.0)
Monocytes Absolute: 0.7 K/uL (ref 0.1–1.0)
Monocytes Relative: 9 %
Neutro Abs: 4.6 K/uL (ref 1.7–7.7)
Neutrophils Relative %: 56 %
Platelets: 378 K/uL (ref 150–400)
RBC: 4.6 MIL/uL (ref 3.87–5.11)
RDW: 11.9 % (ref 11.5–15.5)
Smear Review: NORMAL
WBC: 8.3 K/uL (ref 4.0–10.5)
nRBC: 0 % (ref 0.0–0.2)

## 2024-07-20 LAB — GLUCOSE, CAPILLARY
Glucose-Capillary: 134 mg/dL — ABNORMAL HIGH (ref 70–99)
Glucose-Capillary: 155 mg/dL — ABNORMAL HIGH (ref 70–99)
Glucose-Capillary: 131 mg/dL — ABNORMAL HIGH (ref 70–99)
Glucose-Capillary: 348 mg/dL — ABNORMAL HIGH (ref 70–99)
Glucose-Capillary: 157 mg/dL — ABNORMAL HIGH (ref 70–99)

## 2024-07-20 MED ORDER — ENSURE MAX PROTEIN PO LIQD
11.0000 [oz_av] | Freq: Two times a day (BID) | ORAL | Status: DC
  Administered 2024-07-21 – 2024-07-27 (×5): 11 [oz_av] via ORAL

## 2024-07-20 MED ORDER — INSULIN ASPART 100 UNIT/ML IJ SOLN
0.0000 [IU] | Freq: Three times a day (TID) | INTRAMUSCULAR | Status: DC
  Administered 2024-07-21 – 2024-07-22 (×3): 2 [IU] via SUBCUTANEOUS
  Administered 2024-07-22: 3 [IU] via SUBCUTANEOUS
  Administered 2024-07-22 – 2024-07-26 (×4): 2 [IU] via SUBCUTANEOUS
  Administered 2024-07-27: 19:00:00 3 [IU] via SUBCUTANEOUS
  Administered 2024-07-31: 2 [IU] via SUBCUTANEOUS
  Filled 2024-07-20: qty 3
  Filled 2024-07-20 (×2): qty 2
  Filled 2024-07-20: qty 3
  Filled 2024-07-20 (×5): qty 2
  Filled 2024-07-20: qty 3
  Filled 2024-07-20: qty 5

## 2024-07-20 MED ORDER — INSULIN ASPART 100 UNIT/ML IJ SOLN: 0.0000 [IU] | Freq: Every day | INTRAMUSCULAR | Status: DC

## 2024-07-20 MED ORDER — CLOPIDOGREL BISULFATE 75 MG PO TABS
75.0000 mg | ORAL_TABLET | Freq: Every day | ORAL | Status: DC
  Administered 2024-07-21 – 2024-08-01 (×12): 75 mg via ORAL
  Filled 2024-07-20 (×12): qty 1

## 2024-07-20 NOTE — Progress Notes (Signed)
 PROGRESS NOTE   Subjective/Complaints:  Working with speech therapy.  According to speech therapy patient reluctant to have a long length of stay. We discussed with the patient her current deficits, the slow process of recovery after stroke.  We need to optimize function as well as the inpatient rehab setting as the place to achieve these goals most expeditiously Review of systems negative chest pain shortness of breath nausea vomiting diarrhea or constipation.  Objective:   DG Swallowing Func-Speech Pathology Result Date: 07/18/2024 Table formatting from the original result was not included. Modified Barium Swallow Study Patient Details Name: Ellene Bloodsaw MRN: [id number] Date of Birth: 12-Sep-1960 Today's Date: 07/18/2024 HPI/PMH: HPI: Safiya Lawanda Reta is a 63 year old female admitted after fall. MRI acute infarct involving the left internal capsule/basal ganglia, chronic small vessel ischemic disease with multiple chronic lacunar infarcts. CXR no acute cardiopulmonary abnormality. PMH:   diabetes, hypertension, vitamin D deficiency, osteoarthritis.Repeat MBS for possible liquid/texture upgrade. Clinical Impression: Clinical Impression: Pt demonstrated improved swallow function from prior MBS. There was mild decreased lingual control with intermittent premature spill to valleculae and significantly less oral residue from previous study. Laryngeal strength and mobility are adequate with pt demonstrating decreased pharyngeal timing with barium filling  pyriforms resulting in one episode of aspiration, clearing with strong reflexive cough (PAS 6). Penetration resulted from premature spill with thin into vestibule once but was ejected during the swallow (PAS 2). A cue to orally hold smaller bolus for several seconds prior to transit resulted in only intermittent trace spill to valleculae with laryngeal protection during the swallow. She  was able to consistently perform strategy over multiple trials. Trace barium ascended from PES x 1 and esophageal scan revealed mild retention. Mastication mildly slow without residue during or study or when observed with additional trial cracker after study completed. Recommend regular texture (dentures donned), small sips with brief bolus hold, pills whole in puree and check right side oral cavity for potential residue. Factors that may increase risk of adverse event in presence of aspiration Noe & Lianne 2021): Factors that may increase risk of adverse event in presence of aspiration Noe & Lianne 2021): Reduced cognitive function Recommendations/Plan: Swallowing Evaluation Recommendations Swallowing Evaluation Recommendations Recommendations: PO diet PO Diet Recommendation: Regular; Thin liquids (Level 0) Liquid Administration via: Cup; Straw Medication Administration: Whole meds with puree Supervision: Patient able to self-feed; Full supervision/cueing for swallowing strategies (initially) Swallowing strategies  : Slow rate; Small bites/sips; Check for pocketing or oral holding (bolus hold with thin for several seconds prior to transit) Postural changes: Position pt fully upright for meals Oral care recommendations: Oral care BID (2x/day) Treatment Plan Treatment Plan Treatment recommendations: Therapy as outlined in treatment plan below Follow-up recommendations: Acute inpatient rehab (3 hours/day) Functional status assessment: Patient has had a recent decline in their functional status and demonstrates the ability to make significant improvements in function in a reasonable and predictable amount of time. Treatment frequency: Min 2x/week Treatment duration: 2 weeks Interventions: Patient/family education; Compensatory techniques; Diet toleration management by SLP Recommendations Recommendations for follow up therapy are one component of a multi-disciplinary discharge planning process, led by the  attending physician.  Recommendations  may be updated based on patient status, additional functional criteria and insurance authorization. Assessment: Orofacial Exam: Orofacial Exam Oral Cavity: Oral Hygiene: WFL Oral Cavity - Dentition: Dentures, top; Dentures, bottom Orofacial Anatomy: Other (comment) Oral Motor/Sensory Function: Suspected cranial nerve impairment CN V - Trigeminal: Not tested CN VII - Facial: Right motor impairment CN XII - Hypoglossal: Right motor impairment Anatomy: Anatomy: Suspected cervical osteophytes Boluses Administered: Boluses Administered Boluses Administered: Thin liquids (Level 0); Mildly thick liquids (Level 2, nectar thick); Moderately thick liquids (Level 3, honey thick); Puree; Solid  Oral Impairment Domain: Oral Impairment Domain Lip Closure: Interlabial escape, no progression to anterior lip Tongue control during bolus hold: Posterior escape of less than half of bolus; Escape to lateral buccal cavity/floor of mouth Bolus preparation/mastication: Slow prolonged chewing/mashing with complete recollection (improved from prior MBS) Bolus transport/lingual motion: Brisk tongue motion Oral residue: Residue collection on oral structures Location of oral residue : Lateral sulci; Tongue Initiation of pharyngeal swallow : Pyriform sinuses  Pharyngeal Impairment Domain: Pharyngeal Impairment Domain Soft palate elevation: No bolus between soft palate (SP)/pharyngeal wall (PW) Laryngeal elevation: Complete superior movement of thyroid cartilage with complete approximation of arytenoids to epiglottic petiole Anterior hyoid excursion: Complete anterior movement Epiglottic movement: Complete inversion Laryngeal vestibule closure: Complete, no air/contrast in laryngeal vestibule Pharyngeal stripping wave : Present - complete Pharyngeal contraction (A/P view only): N/A Pharyngoesophageal segment opening: Partial distention/partial duration, partial obstruction of flow Tongue base retraction: No  contrast between tongue base and posterior pharyngeal wall (PPW) Pharyngeal residue: Complete pharyngeal clearance (oral residue spill miminal but clear after initial swalllow) Location of pharyngeal residue: N/A  Esophageal Impairment Domain: Esophageal Impairment Domain Esophageal clearance upright position: Esophageal retention (min) Pill: No data recorded Penetration/Aspiration Scale Score: Penetration/Aspiration Scale Score 1.  Material does not enter airway: Solid; Puree; Moderately thick liquids (Level 3, honey thick); Mildly thick liquids (Level 2, nectar thick) 2.  Material enters airway, remains ABOVE vocal cords then ejected out: Thin liquids (Level 0) 6.  Material enters airway, passes BELOW cords then ejected out: Thin liquids (Level 0) Compensatory Strategies: Compensatory Strategies Compensatory strategies: Yes Straw: Effective Effective Straw: Thin liquid (Level 0) Chin tuck: Ineffective Ineffective Chin Tuck: Thin liquid (Level 0) Oral bolus hold: Effective Effective Oral Bolus Hold : Thin liquid (Level 0)   General Information: Caregiver present: No  Diet Prior to this Study: Dysphagia 2 (finely chopped); Mildly thick liquids (Level 2, nectar thick)   Temperature : Normal   Respiratory Status: WFL   Supplemental O2: None (Room air)   History of Recent Intubation: No  Behavior/Cognition: Alert; Cooperative; Pleasant mood Self-Feeding Abilities: Able to self-feed Baseline vocal quality/speech: Normal Volitional Cough: Able to elicit Volitional Swallow: Able to elicit Exam Limitations: No limitations Goal Planning: Prognosis for improved oropharyngeal function: Good Barriers to Reach Goals: Cognitive deficits No data recorded No data recorded Consulted and agree with results and recommendations: Patient; Family member/caregiver Pain: Pain Assessment Pain Assessment: No/denies pain End of Session: Start Time:SLP Start Time (ACUTE ONLY): 1208 Stop Time: SLP Stop Time (ACUTE ONLY): 1232 Time  Calculation:SLP Time Calculation (min) (ACUTE ONLY): 24 min Charges: SLP Evaluations $ SLP Speech Visit: 1 Visit SLP Evaluations $MBS Swallow: 1 Procedure $ SLP EVAL LANGUAGE/SOUND PRODUCTION: 1 Procedure $Swallowing Treatment: 1 Procedure SLP visit diagnosis: SLP Visit Diagnosis: Dysphagia, oropharyngeal phase (R13.12) Past Medical History: Past Medical History: Diagnosis Date  Diabetes mellitus without complication (HCC)   Hypertension  Past Surgical History: Past Surgical History: Procedure Laterality Date  LEFT HEART CATH AND CORONARY ANGIOGRAPHY N/A 08/02/2017  Procedure: LEFT HEART CATH AND CORONARY ANGIOGRAPHY;  Surgeon: Levern Hutching, MD;  Location: [facility];  Service: Cardiovascular;  Laterality: N/A; Dustin Olam Bull 07/18/2024, 5:16 PM  Recent Labs    07/17/24 0900 07/20/24 0441  WBC 7.8 8.3  HGB 15.4* 15.1*  HCT 46.1* 44.5  PLT 370 378   Recent Labs    07/17/24 0900 07/20/24 0441  NA 141 137  K 3.9 5.0  CL 99 99  CO2 30 25  GLUCOSE 209* 126*  BUN 10 23  CREATININE 0.72 0.69  CALCIUM  9.8 9.2    Intake/Output Summary (Last 24 hours) at 07/20/2024 9167 Last data filed at 07/19/2024 1855 Gross per 24 hour  Intake 240 ml  Output --  Net 240 ml        Physical Exam: Vital Signs Blood pressure 112/79, pulse 86, temperature 98.1 F (36.7 C), temperature source Oral, resp. rate 17, height 5' 4 (1.626 m), weight 74 kg, SpO2 96%.  General: No acute distress Mood and affect are appropriate Heart: Regular rate and rhythm no rubs murmurs or extra sounds Lungs: Clear to auscultation, breathing unlabored, no rales or wheezes Abdomen: Positive bowel sounds, soft nontender to palpation, nondistended Extremities: No clubbing, cyanosis, or edema Skin: No evidence of breakdown, no evidence of rash Neurologic: Cranial nerves II through XII intact, motor strength is 5/5 in left and 3 -/5 right deltoid, bicep, tricep, grip, hip flexor, knee extensors, 5/5 left and 2  -/5 right ankle dorsiflexor and plantar flexor Sensory exam normal sensation to light touch in bilateral upper and lower extremities Cerebellar exam normal finger to nose to finger as well as heel to shin in left upper and lower extremities, right upper extremity testing limited by weakness Musculoskeletal: Full range of motion in all 4 extremities. No joint swelling, no pain with range of motion    Assessment/Plan: 1. Functional deficits which require 3+ hours per day of interdisciplinary therapy in a comprehensive inpatient rehab setting. Physiatrist is providing close team supervision and 24 hour management of active medical problems listed below. Physiatrist and rehab team continue to assess barriers to discharge/monitor patient progress toward functional and medical goals  Care Tool:  Bathing              Bathing assist       Upper Body Dressing/Undressing Upper body dressing        Upper body assist      Lower Body Dressing/Undressing Lower body dressing            Lower body assist       Toileting Toileting    Toileting assist       Transfers Chair/bed transfer  Transfers assist           Locomotion Ambulation   Ambulation assist              Walk 10 feet activity   Assist           Walk 50 feet activity   Assist           Walk 150 feet activity   Assist           Walk 10 feet on uneven surface  activity   Assist           Wheelchair     Assist               Wheelchair 50 feet with 2  turns activity    Assist            Wheelchair 150 feet activity     Assist          Blood pressure 112/79, pulse 86, temperature 98.1 F (36.7 C), temperature source Oral, resp. rate 17, height 5' 4 (1.626 m), weight 74 kg, SpO2 96%.  Medical Problem List and Plan: 1. Functional deficits secondary to left internal capsule, basal ganglia infarction related to small vessel disease              -patient may shower             -ELOS/Goals: 2 weeks Min a PT/OT possibly WC level, sup SLP             -Admit to CIR We discussed stroke risk factors, patient recently gave up smoking and alcohol use just prior to Thanksgiving.  2.  Antithrombotics: -DVT/anticoagulation:  Pharmaceutical: Xarelto  x 30 days for DVT prophylaxis             -antiplatelet therapy: Aspirin  81 mg daily and Plavix  75 mg daily x 3 weeks then Plavix  alone-confirmed with IM team 3. Pain Management: Voltaren  gel 2 g 4 times daily, lidocaine  patch 4. Mood/Behavior/Sleep: Provide emotional support             -antipsychotic agents: N/A 5. Neuropsych/cognition: This patient is capable of making decisions on her own behalf. 6. Skin/Wound Care: Routine skin checks 7. Fluids/Electrolytes/Nutrition: Routine in and outs with follow-up chemistries 8.  Hypertension.  Norvasc  5 mg daily, Cozaar  25 mg daily.  Monitor with increased mobility. Avoid hypotension Vitals:   07/19/24 2033 07/20/24 0248  BP: 111/81 112/79  Pulse: 98 86  Resp: 16 17  Temp: 98.3 F (36.8 C) 98.1 F (36.7 C)  SpO2: 96% 96%  Well-controlled 9.  Diabetes mellitus.  Hemoglobin A1c 9.5.  Glucophage  500 mg daily, NovoLog  3 units 3 times daily, Lantus  insulin  14 units daily, Farxiga  10 mg daily CBG (last 3)  Recent Labs    07/19/24 1645 07/19/24 2123 07/20/24 0614  GLUCAP 115* 164* 134*  Well-controlled  10.  Hyperlipidemia.  Lipitor /Zetia  11.  Tobacco abuse as well as history of alcohol.  NicoDerm patch.  Alcohol negative on admission. 12.  Constipation. Increase miralax  to BID, continue senokot, start sorbitol PRN 13.  L knee pain.  Improved with conservative treatment.  X-ray indicates effusion and OA.    LOS: 1 days A FACE TO FACE EVALUATION WAS PERFORMED  Prentice FORBES Compton 07/20/2024, 8:32 AM

## 2024-07-20 NOTE — Progress Notes (Signed)
 Met with patient to review current situation, team conference and plan of care. Reviewed medications, smoking cessation, alcohol cessation. Patient understood.  Educated on stroke prophylaxis, co morbidities hypertension, DM, HLD. Continue to follow along to provide educational needs to facilitate preparation for discharge.

## 2024-07-20 NOTE — Plan of Care (Signed)
°  Problem: RH Swallowing Goal: LTG Patient will consume least restrictive diet using compensatory strategies with assistance (SLP) Description: LTG:  Patient will consume least restrictive diet using compensatory strategies with assistance (SLP) Flowsheets (Taken 07/20/2024 1055) LTG: Pt Patient will consume least restrictive diet using compensatory strategies with assistance of (SLP): Supervision   Problem: RH Cognition - SLP Goal: RH LTG Patient will demonstrate orientation with cues Description:  LTG:  Patient will demonstrate orientation to person/place/time/situation with cues (SLP)   Flowsheets (Taken 07/20/2024 1055) LTG Patient will demonstrate orientation to: Situation LTG: Patient will demonstrate orientation using cueing (SLP): Supervision   Problem: RH Expression Communication Goal: LTG Patient will increase speech intelligibility (SLP) Description: LTG: Patient will increase speech intelligibility at word/phrase/conversation level with cues, % of the time (SLP) Flowsheets (Taken 07/20/2024 1055) LTG: Patient will increase speech intelligibility (SLP): Supervision Level: Conversation level Percent of time patient will use intelligible speech: 95 Goal: LTG Patient will increase word finding of common (SLP) Description: LTG:  Patient will increase word finding of common objects/daily info/abstract thoughts with cues using compensatory strategies (SLP). Flowsheets (Taken 07/20/2024 1055) LTG: Patient will increase word finding of common (SLP): Supervision Patient will use compensatory strategies to increase word finding of: Daily info   Problem: RH Problem Solving Goal: LTG Patient will demonstrate problem solving for (SLP) Description: LTG:  Patient will demonstrate problem solving for basic/complex daily situations with cues  (SLP) Flowsheets (Taken 07/20/2024 1055) LTG: Patient will demonstrate problem solving for (SLP): Basic daily situations LTG Patient will demonstrate  problem solving for: Minimal Assistance - Patient > 75%   Problem: RH Memory Goal: LTG Patient will demonstrate ability for day to day (SLP) Description: LTG:   Patient will demonstrate ability for day to day recall/carryover during cognitive/linguistic activities with assist  (SLP) Flowsheets (Taken 07/20/2024 1055) LTG: Patient will demonstrate ability for day to day recall: New information LTG: Patient will demonstrate ability for day to day recall/carryover during cognitive/linguistic activities with assist (SLP): Minimal Assistance - Patient > 75%   Problem: RH Awareness Goal: LTG: Patient will demonstrate awareness during functional activites type of (SLP) Description: LTG: Patient will demonstrate awareness during functional activites type of (SLP) Flowsheets (Taken 07/20/2024 1055) Patient will demonstrate during cognitive/linguistic activities awareness type of: Intellectual LTG: Patient will demonstrate awareness during cognitive/linguistic activities with assistance of (SLP): Minimal Assistance - Patient > 75%

## 2024-07-20 NOTE — Progress Notes (Signed)
 Has been getting glucerna TID between meals. Had BS checked 15 minutes after supplement tonight with report that it was 348. Advised nurse to recheck in an hour for more accurate reading. Will change glucerna to ensure max which has 4-5 net carbs.  Will add conservative HS SSI for now as tends to drop significantly at nights. SABRA

## 2024-07-20 NOTE — Evaluation (Addendum)
 Occupational Therapy Assessment and Plan  Patient Details  Name: Darlene Pugh MRN: [id number] Date of Birth: 03/05/1961  OT Diagnosis: cognitive deficits, disturbance of vision, hemiplegia affecting dominant side, and muscle weakness (generalized) Rehab Potential: Rehab Potential (ACUTE ONLY): Good ELOS: 2-3 weeks   Today's Date: 07/20/2024 OT Individual Time: 8952-8797 OT Individual Time Calculation (min): 75 min     Hospital Problem: Principal Problem:   Infarction of left basal ganglia (HCC)   Past Medical History:  Past Medical History:  Diagnosis Date   Diabetes mellitus without complication (HCC)    Hypertension    Past Surgical History:  Past Surgical History:  Procedure Laterality Date   LEFT HEART CATH AND CORONARY ANGIOGRAPHY N/A 08/02/2017   Procedure: LEFT HEART CATH AND CORONARY ANGIOGRAPHY;  Surgeon: Levern Hutching, MD;  Location: [facility];  Service: Cardiovascular;  Laterality: N/A;    Assessment & Plan Clinical Impression: Darlene Pugh is a 63 year old right-handed female with history significant for depression, hyperlipidemia, alcohol use, hypertension, diabetes mellitus as well as tobacco use as well as history of CVA with residual left facial numbness maintained on aspirin  as well as Brilinta . Per chart review patient lives alone. Independent prior to admission and driving. 1 level home 4 steps to entry. Presented 07/10/2024 after being found down on the ground by her property manager with facial droop dysarthria and weakness. She endorses 2 recent falls without loss of consciousness. Blood pressure on admission 160/99. CTA showed no evidence of emergent large vessel occlusion. 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 MRI showed acute infarct involving the left internal capsule/basal ganglia. Patient did not receive TNK. LVO  negative. Admission chemistries unremarkable except potassium 3.4 glucose 218, alcohol negative, total CK of 172, hemoglobin A1c 9.5. Echocardiogram ejection fraction of 55 to 60% no wall motion abnormalities. Neurology follow-up currently maintained on aspirin  81 mg daily and Plavix  75 mg daily for 3 weeks then Plavix  alone. She has been placed on Xarelto  10 mg daily for 30 days for VTE prophylaxis. She did have a urine culture greater than 100,000 colonies yeast and completed 3-day course of Rocephin . Patient reports she had left knee pain due to the fall before admission. Patient reports this has been gradually improving. Appears she was treated with Ace wrap's, ice packs and Toradol . She had an x-ray with mild lateral compartmental degeneration arthritis and moderate joint effusion. Her diet has been advanced to a regular consistency.  Patient transferred to CIR on 07/19/2024 .    Patient currently requires mod-max with basic self-care skills and IADL secondary to muscle weakness, decreased cardiorespiratoy endurance, impaired timing and sequencing, abnormal tone, unbalanced muscle activation, decreased coordination, and decreased motor planning, decreased visual motor skills, decreased attention to right and decreased motor planning, decreased initiation, decreased attention, decreased awareness, decreased problem solving, and decreased safety awareness, central origin, and decreased sitting balance, decreased standing balance, decreased postural control, hemiplegia, and decreased balance strategies. Prior to hospitalization, patient could complete ADLs, IADLs, and drive with independent .  Patient will benefit from skilled intervention to decrease level of assist with basic self-care skills and increase independence with basic self-care skills prior to discharge home with care partner.  Anticipate patient will require 24 hour supervision and minimal physical assistance and follow up outpatient.  OT - End  of Session Activity Tolerance: Decreased this session Endurance Deficit: Yes OT Assessment Rehab Potential (ACUTE ONLY):  Good OT Patient demonstrates impairments in the following area(s): Balance;Perception;Cognition;Safety;Sensory;Endurance;Motor;Vision OT Basic ADL's Functional Problem(s): Bathing;Dressing;Grooming OT Advanced ADL's Functional Problem(s): Laundry OT Transfers Functional Problem(s): Toilet;Tub/Shower OT Additional Impairment(s): Fuctional Use of Upper Extremity OT Plan OT Intensity: Minimum of 1-2 x/day, 45 to 90 minutes OT Frequency: 5 out of 7 days OT Duration/Estimated Length of Stay: 2-3 weeks OT Treatment/Interventions: Balance/vestibular training;Discharge planning;Pain management;Functional electrical stimulation;Self Care/advanced ADL retraining;Therapeutic Activities;UE/LE Coordination activities;Cognitive remediation/compensation;Disease mangement/prevention;Patient/family education;Functional mobility training;Skin care/wound managment;Therapeutic Exercise;Visual/perceptual remediation/compensation;Community reintegration;DME/adaptive equipment instruction;Neuromuscular re-education;Psychosocial support;Splinting/orthotics;UE/LE Strength taining/ROM;Wheelchair propulsion/positioning OT Self Feeding Anticipated Outcome(s): MOD I OT Basic Self-Care Anticipated Outcome(s): SUP-CGA OT Toileting Anticipated Outcome(s): CGA OT Bathroom Transfers Anticipated Outcome(s): CGA OT Recommendation Recommendations for Other Services: Therapeutic Recreation consult Therapeutic Recreation Interventions: Pet therapy Patient destination: Home Follow Up Recommendations: Outpatient OT Equipment Recommended: To be determined   OT Evaluation Precautions/Restrictions  Precautions Precautions: Fall Recall of Precautions/Restrictions: Impaired Restrictions Weight Bearing Restrictions Per Provider Order: No General Chart Reviewed: Yes Additional Pertinent History: depression,  hyperlipidemia, alcohol use, hypertension, diabetes mellitus Family/Caregiver Present: No Vital Signs   Pain Pain Assessment Pain Scale: 0-10 Pain Score: 0-No pain Home Living/Prior Functioning Home Living Family/patient expects to be discharged to:: Private residence Living Arrangements: Alone Available Help at Discharge: Family, Available PRN/intermittently Type of Home: Apartment Home Access: Stairs to enter [employer] of Steps: 4 Entrance Stairs-Rails: Right, Left, Can reach both Home Layout: One level Bathroom Shower/Tub: Engineer, [employer] Systems: Standard Bathroom Accessibility: Yes  Lives With: Alone IADL History Homemaking Responsibilities: Yes Meal Prep Responsibility: Primary Laundry Responsibility: Primary Cleaning Responsibility: Primary Bill Paying/Finance Responsibility: Primary Shopping Responsibility: Primary Child Care Responsibility: Primary Current License: Yes Mode of Transportation: Car Occupation: On disability Prior Function Level of Independence: Independent with basic ADLs, Independent with gait, Independent with homemaking with ambulation, Independent with transfers  Able to Take Stairs?: Yes Driving: Yes Vocation: On disability Vision Baseline Vision/History: 1 Wears glasses Ability to See in Adequate Light: 0 Adequate Patient Visual Report: No change from baseline Vision Assessment?: Vision impaired- to be further tested in functional context;Yes Eye Alignment: Within Functional Limits Ocular Range of Motion: Restricted on the right Alignment/Gaze Preference: Head turned Tracking/Visual Pursuits: Decreased smoothness of vertical tracking;Decreased smoothness of horizontal tracking;Impaired - to be further tested in functional context;Other (comment) (decreased smoothness during tracking, however Pt reports no change from baseline) Saccades: Undershoots;Impaired - to be further tested in functional context;Decreased speed of  saccadic movement (undershoots with slowed movements looking R) Convergence: Within functional limits Visual Fields: No apparent deficits Perception  Perception: Impaired Perception-Other Comments: Mild R inattention with L Gaze preference Praxis Praxis: Impaired Praxis Impairment Details: Motor planning Cognition Cognition Overall Cognitive Status: Impaired/Different from baseline Arousal/Alertness: Awake/alert Orientation Level: Person;Place;Situation Person: Oriented Place: Oriented Situation: Oriented Memory: Impaired Memory Impairment: Decreased short term memory Decreased Short Term Memory: Verbal basic Attention: Sustained Sustained Attention: Impaired Sustained Attention Impairment: Verbal basic;Functional basic Awareness: Impaired Awareness Impairment: Intellectual impairment;Emergent impairment Problem Solving: Impaired Problem Solving Impairment: Verbal basic Safety/Judgment: Impaired Brief Interview for Mental Status (BIMS) Repetition of Three Words (First Attempt): 3 Temporal Orientation: Year: Correct Temporal Orientation: Month: Accurate within 5 days Temporal Orientation: Day: Correct Recall: Sock: No, could not recall Recall: Blue: Yes, after cueing (a color) Recall: Bed: No, could not recall BIMS Summary Score: 10 Sensation Sensation Light Touch: Impaired Detail Light Touch Impaired Details: Impaired RUE;Impaired RLE Hot/Cold: Not tested Proprioception: Impaired Detail Proprioception Impaired Details: Impaired RUE;Impaired RLE (mild) Stereognosis: Not tested Educational Psychologist  Movements are Fluid and Coordinated: No Fine Motor Movements are Fluid and Coordinated: No Coordination and Movement Description: Decreased coordination on R hemibody d/t hemiplegia Finger Nose Finger Test: Slowed on R Motor  Motor Motor: Hemiplegia Motor - Skilled Clinical Observations: R hemiplegia wiht slowed movements  Trunk/Postural Assessment  Cervical  Assessment Cervical Assessment: Within Functional Limits Thoracic Assessment Thoracic Assessment: Exceptions to WFL (rounded shouldrs) Lumbar Assessment Lumbar Assessment: Exceptions to [facility] (posterior pelvic tilt) Postural Control Postural Control: Deficits on evaluation Righting Reactions: Decreased Protective Responses: Delayed  Balance Balance Balance Assessed: Yes Static Sitting Balance Static Sitting - Balance Support: Feet supported Static Sitting - Level of Assistance: 5: Stand by assistance Static Standing Balance Static Standing - Balance Support: During functional activity Static Standing - Level of Assistance: 4: Min assist Dynamic Standing Balance Dynamic Standing - Balance Support: During functional activity Dynamic Standing - Level of Assistance: 4: Min assist;3: Mod assist Extremity/Trunk Assessment RUE Assessment RUE Assessment: Exceptions to [facility] Passive Range of Motion (PROM) Comments: WFL Active Range of Motion (AROM) Comments: decreased shoudler flexion ~90* General Strength Comments: 3+/5 overall, slowed motor movements LUE Assessment LUE Assessment: Within Functional Limits FAST-UL Outcome Measure  Hand-to-mouth (HtM) Movement Starting Position: Participant seated on a standard chair without armrests. Trunk leaning on back support of chair. Both hands placed in pronated position on the ipsilateral middle thigh. Feet placed flat on the floor. If participants have any difficulty in understanding instructions (i.e. aphasia) a visual demonstration is suggested. For each of the 5 tasks of the FAST-UL, the subject at first performs the movement with the less affected UL and then with the affected one. The movement can be repeated 3 times and the best score of the three attempts is assigned.   Instructions: Each subject is asked to move the hand towards the mouth, touch it with fingertips and return to the thigh. Motor task occurs without moving the trunk off the  back support and without moving the head toward the hand.   Scoring: Clinical score from 0 to 3 is provided by comparing affected side with less affected one as follows: 0 = no movement at all. 1 = The movement task is not completed (less of 50% of the contralateral HtM movement). 2 = The movement task is not completed (more of 50% of the contralateral HtM movement but the mouth is not reached) or the movement task is completed with compensations. If the mouth is touched with the wrist or the palm or the movement is performed with head or trunk compensations (flexion of the head and trunk towards the hand) the score is 2.   3 = movement carried out at 100% of the contralateral HtM movement. HtM occurs with adequate shoulder flexion and abduction, elbow flexion, and forearm supination. The mouth is touched with fingertips.  Patient Score: 2   Reach to Target (RtT) Movement Starting Position: Same starting conditions of HtM movement. Instructions: Each subject is asked to move the hand toward a target (i.e. the hand of the examiner) located in front of the subject in the ipsilateral workspace at shoulder height, at a distance corresponding to 100% of the fully extended UL within arm's reach (less affected arm as reference). Participants have to reach, touch the target, and return. Motor task occurs without moving the trunk off the back support. Scoring: Clinical score from 0 to 3 is provided by comparing affected side with less affected one as follows: 0 = no movement at all. 1 = The  movement task is not completed (less of 50% of the contralateral RtT movement).  2 = The movement task is not completed (more of 50% of the contralateral RtT movement but the target is not reached) or the movement task is completed with compensations (i.e. the trunk loses contact with the back support of the chair with forward displacement, shoulder flexion occurs with excessive scapular elevation, or shoulder excessive  abduction). If the target is reached with trunk or shoulder compensations for inadequate elbow and finger extension the score is 2.  3 = movement performed at 100% of the contralateral RtT. The target is reached with adequate shoulder flexion, elbow, wrist and finger extension.  Patient Score: 2   Prono-supination (PS) Movement Starting Position: Same starting conditions of HtM movement. Instructions: Motor task occurs without moving the trunk anteriorly or laterally, the medial side of the humerus is against the body, the forearm is fully pronated with the hand resting on the thigh. Scoring: Clinical score from 0 to 3 is provided by comparing paretic side with less affected one as follows: 0 = no movement at all. 1 = The movement task is not completed (less of 50% of the contralateral PS movement).  2 = The movement task is not completed (more of 50% of the contralateral PS movement but the forearm is not fully supinated) or the movement task is completed with compensations (i.e. excessive trunk inclination, shoulder abduction). If the movement is completed with compensations at elbow, shoulder or trunk level the score is 2. 3 = movement performed at 100% of the contralateral PS (complete supination of the forearm with the dorsal part of the hand in contact with the thigh).   Patient Score: 2   Grasp and Release (GaR) Movement Starting position: Participant seated on a standard chair. Hip and knees in 90 flexion, feet flat on the floor. Upper limb (UL) resting on a table in front of the participant with approximately 90 elbow flexion, forearm pronated and fingers in a relaxed extended and adducted position.  Instructions: The subject performs a grasping movement of a cylindrical rigid glass (at least 6 cm diameter) placed proximally to an imaginary line connecting the distal joints of thumb and index finger. The subject is asked to grasp the glass, lift it at least 2 cm (elbow remains in  contact with the table), and release it. Scoring:  Clinical score from 0 to 3 is provided by comparing affected side with less affected one as follows: 0 = No movement. The grasp is not possible. 1 = The movement task is not completed (less of 50% of the task). Some prehension is possible but the grasp is not sufficiently stable to lift the object; the grasp can be performed with the use of the less affected hand only to stabilize the glass for inadequate hand/finger opening and the release is not possible. Some hand opening is required otherwise the score is 0. 2 = The movement task is not completed (more of 50% of the task). The object is grasped and lifted but it falls or the task is completed using alternative grasping strategies (i.e. multi-pulpar, palmar, digito palmar; grasping and releasing of the object is possible with abnormal orientation of the wrist and fingers toward the object and the forearm is lifted off the table). 3 = The task is completed using the expected pattern (normal orientation of fingers or wrist toward the object, the grasp occurs with thumb and fingers in opposition, forearm supination, elbow flexion; thumb abduction and  finger extension to release the object).  Patient Score: 1   Pinch and Release (PaR) Movement Starting position: Same starting conditions of GaR movement The participant performs a PaR movement of a pen placed on a table in the midline of an imaginary line connecting the distal joints of thumb and index finger. The participants asked to pinch the pen with the tips of thumb and index finger, lift it at least 2 cm (elbow remains in contact with the table), and release it. Clinical score from 0 to 3 is provided by comparing affected side with non-affected one as follows: 0 = No movement. The pinch is not possible. 1 = The movement task is not completed (less of 50% of the task). Some prehension is possible but the pinch is not sufficiently stable to lift  the object; the pinch occurs with the use of the less affected hand to stabilize the object for inadequate finger opening and the release is not possible. Some fingers movement is required otherwise the score is 0. 2 = The movement task is not completed (more of 50% of the task). The object is pinched and lifted but it falls or the task is completed using alternative pinching strategies (e.g. pinching with all the fingers, tripod pinch, pinching and releasing of the object is possible with abnormal orientation of fingers and wrist toward the object and the forearm is lifted off the table). 3 = The task is completed using the expected pattern (normal orientation of fingers or wrist toward the object, the pinch occurs with opposition of pads of index finger and thumb, and wrist extension).  Patient Score: 9  Total score: 9/15   Care Tool Care Tool Self Care Eating   Eating Assist Level: Supervision/Verbal cueing    Oral Care    Oral Care Assist Level: Supervision/Verbal cueing    Bathing   Body parts bathed by patient: Right arm;Chest;Abdomen;Front perineal area;Right upper leg;Buttocks;Left upper leg;Face Body parts bathed by helper: Left arm;Right lower leg;Left lower leg   Assist Level: Moderate Assistance - Patient 50 - 74%    Upper Body Dressing(including orthotics)   What is the patient wearing?: Pull over shirt   Assist Level: Moderate Assistance - Patient 50 - 74%    Lower Body Dressing (excluding footwear)   What is the patient wearing?: Pants;Underwear/pull up Assist for lower body dressing: Maximal Assistance - Patient 25 - 49%    Putting on/Taking off footwear   What is the patient wearing?: Socks Assist for footwear: Total Assistance - Patient < 25%       Care Tool Toileting Toileting activity   Assist for toileting: Maximal Assistance - Patient 25 - 49%     Care Tool Bed Mobility Roll left and right activity   Roll left and right assist level: Minimal Assistance  - Patient > 75%    Sit to lying activity   Sit to lying assist level: Minimal Assistance - Patient > 75%    Lying to sitting on side of bed activity   Lying to sitting on side of bed assist level: the ability to move from lying on the back to sitting on the side of the bed with no back support.: Minimal Assistance - Patient > 75%     Care Tool Transfers Sit to stand transfer   Sit to stand assist level: Moderate Assistance - Patient 50 - 74%    Chair/bed transfer   Chair/bed transfer assist level: Moderate Assistance - Patient 50 - 74%  Toilet transfer   Assist Level: Moderate Assistance - Patient 50 - 74%     Care Tool Cognition  Expression of Ideas and Wants Expression of Ideas and Wants: 3. Some difficulty - exhibits some difficulty with expressing needs and ideas (e.g, some words or finishing thoughts) or speech is not clear  Understanding Verbal and Non-Verbal Content Understanding Verbal and Non-Verbal Content: 4. Understands (complex and basic) - clear comprehension without cues or repetitions   Memory/Recall Ability Memory/Recall Ability : Current season;That he or she is in a hospital/hospital unit   Refer to Care Plan for Long Term Goals  SHORT TERM GOAL WEEK 1 OT Short Term Goal 1 (Week 1): Pt will recall hemi-dressing techniques with MIN questioning cues OT Short Term Goal 2 (Week 1): Pt will complete toilet transfers with MIN A consistently using LRAD OT Short Term Goal 3 (Week 1): Pt will complete toileting with MIN A using LRAD OT Short Term Goal 4 (Week 1): Pt will utilize R UE at a gross assist level with SUP OT Short Term Goal 5 (Week 1): Pt will complete LB dressing with MOD A  Recommendations for other services: Therapeutic Recreation  Pet therapy   Skilled Therapeutic Intervention Skilled Therapeutic Interventions/Progress Updates:  1:1 OT evaluation and intervention initiated with skilled education provided on OT role goals and POC. Pt received sitting  up in bed urgently requesting to use restroom. Pt presenting to be in good spirits receptive to skilled OT session reporting 0/10 pain- OT offering intermittent rest breaks, repositioning, and therapeutic support to optimize participation in therapy session. Pt completed ADLs at levels listed below this session, completing stand pivot EOB <> BSC with light MOD A and MAX verbal cues for technique. She demonstrates decreased insight into her deficits and some mild anxiety related to mobility following her CVA. She would benefit from additional OT services in IPR setting to increase overall independence in ADLs and IADLs. Pt was left resting in bed with call bell in reach, bed alarm on, and all needs met.   ADL ADL Eating: Supervision/safety Where Assessed-Eating: Bed level Grooming: Supervision/safety Where Assessed-Grooming: Edge of bed Upper Body Bathing: Minimal assistance Where Assessed-Upper Body Bathing: Edge of bed Lower Body Bathing: Moderate assistance Where Assessed-Lower Body Bathing: Edge of bed Upper Body Dressing: Moderate assistance;Minimal assistance Where Assessed-Upper Body Dressing: Edge of bed Lower Body Dressing: Maximal assistance;Moderate assistance Where Assessed-Lower Body Dressing: Edge of bed Toileting: Maximal assistance Where Assessed-Toileting: Bedside Commode Toilet Transfer: Moderate assistance;Minimal assistance Toilet Transfer Method: Stand pivot Toilet Transfer Equipment: Gaffer: Not assessed Film/video Editor: Not assessed Mobility  Bed Mobility Bed Mobility: Supine to Sit;Sit to Supine Supine to Sit: Minimal Assistance - Patient > 75% Sit to Supine: Moderate Assistance - Patient 50-74% Transfers Sit to Stand: Minimal Assistance - Patient > 75%;Moderate Assistance - Patient 50-74% Stand to Sit: Minimal Assistance - Patient > 75%;Moderate Assistance - Patient 50-74%   Discharge Criteria: Patient will be discharged  from OT if patient refuses treatment 3 consecutive times without medical reason, if treatment goals not met, if there is a change in medical status, if patient makes no progress towards goals or if patient is discharged from hospital.  The above assessment, treatment plan, treatment alternatives and goals were discussed and mutually agreed upon: by patient  Darlene Pugh 07/20/2024, 12:47 PM

## 2024-07-20 NOTE — Evaluation (Signed)
 Speech Language Pathology Assessment and Plan  Patient Details  Name: Darlene Pugh MRN: [id number] Date of Birth: October 30, 1960  SLP Diagnosis: Speech and Language deficits;Cognitive Impairments;Dysarthria;Dysphagia  Rehab Potential: Good ELOS: 2 weeks    Today's Date: 07/20/2024 SLP Individual Time: 0800-0900 SLP Individual Time Calculation (min): 60 min   Hospital Problem: Principal Problem:   Infarction of left basal ganglia (HCC)  Past Medical History:  Past Medical History:  Diagnosis Date   Diabetes mellitus without complication (HCC)    Hypertension    Past Surgical History:  Past Surgical History:  Procedure Laterality Date   LEFT HEART CATH AND CORONARY ANGIOGRAPHY N/A 08/02/2017   Procedure: LEFT HEART CATH AND CORONARY ANGIOGRAPHY;  Surgeon: Levern Hutching, MD;  Location: [facility];  Service: Cardiovascular;  Laterality: N/A;    Assessment / Plan / Recommendation Clinical Impression HPI: 63 year old right-handed female with history significant for depression, hyperlipidemia, alcohol use, hypertension, diabetes mellitus as well as tobacco use as well as history of CVA with residual left facial numbness maintained on aspirin  as well as Brilinta .  Presented 07/10/2024 after being found down on the ground by her property manager with facial droop dysarthria and weakness.  She endorses 2 recent falls without loss of consciousness.     Blood pressure on admission 160/99.  CTA showed no evidence of emergent large vessel occlusion.  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  MRI showed acute infarct involving the left internal capsule/basal ganglia.  Patient did not receive TNK.  LVO negative.  Admission chemistries unremarkable except potassium 3.4 glucose 218, alcohol negative, total CK of 172, hemoglobin A1c 9.5.  Echocardiogram ejection fraction of 55 to  60% no wall motion abnormalities.  Neurology follow-up currently maintained on aspirin  81 mg daily and Plavix  75 mg daily for 3 weeks then Plavix  alone.  She has been placed on Xarelto  10 mg daily for 30 days for VTE prophylaxis.  She did have a urine culture greater than 100,000 colonies yeast and completed 3-day course of Rocephin .  Her diet has been advanced to a regular consistency   Clinical Impression:  Bedside Swallow Evaluation: A bedside swallow evaluation was completed to assess for s/sx of oropharyngeal dysphagia. Oral mechanism exam revealed moderate R facial/oral weakness and asymmetry. POs administered included thin liquids via straw and solids. Patient with timely mastication and complete oral clearance. X1 instance of coughing present after liquid wash which may be indicative of bolus misdirection. Of note, patient with intermittent cough throughout evaluation. Patient utilized oral hold of liquids for 2 seconds given modA from SLP. Recommend regular/thin diet with use of standardized precautions including sitting upright during PO, taking small bites/sips at a slow rate and oral hold of liquids for 2 seconds before swallow initiation per MBS results. Recommend full supervision during mealtimes.  Cognitive-Linguistic: Patient was evaluated via portions of the Cognistat and informal assessment. Informally, patient independently oriented to self, location and time, however unable to recall reason for admission. Patient perseverative on going home requiring max encouragement and education re: physical and cognitive deficits indicating rehabilitation stay. On standardized assessment, patient with strengths in orientation and receptive language. Patient with mild deficits in attention, immediate recall, naming, and calculations. Noted x1 instance of semantic paraphasia (clock for watch) and occasional circumlocution - though patient lethargic likely impacting performance. Patient with  moderate-severe deficits in delayed recall and executive functioning. Patient presents with mild-moderate  dysarthria characterized by imprecise articulation secondary to oral weakness - this resulted in approximately 80% intelligibilty at the conversational level. Patient would benefit from skilled ST targeting dysarthria, problem solving, awareness and attention due to patients PLOF PTA. Patient would benefit from further expressive language evaluation.  Pt would benefit from skilled ST services to maximize dysphagia, dysarthria and cognition in order to maximize functional independence at d/c. Anticipate patient will require supervision at d/c and f/u SLP services.    Skilled Therapeutic Interventions          Patient evaluated using a standardized cognitive linguistic assessment and bedside swallow evaluation to assess current cognitive, communicative and swallowing function. See above for details.    SLP Assessment  Patient will need skilled Speech Lanaguage Pathology Services during CIR admission    Recommendations  SLP Diet Recommendations: Age appropriate regular solids;Thin Liquid Administration via: Cup;Straw Medication Administration: Whole meds with puree Supervision: Full supervision/cueing for compensatory strategies Compensations: Small sips/bites;Slow rate (hold liquids in oral cavity 2 seconds before swallow initiation) Postural Changes and/or Swallow Maneuvers: Seated upright 90 degrees;Upright 30-60 min after meal Oral Care Recommendations: Oral care BID Recommendations for Other Services: Neuropsych consult Patient destination: Home Follow up Recommendations: Home Health SLP;Outpatient SLP Equipment Recommended: None recommended by SLP    SLP Frequency 3 to 5 out of 7 days   SLP Duration  SLP Intensity  SLP Treatment/Interventions 2 weeks  Minumum of 1-2 x/day, 30 to 90 minutes  Cognitive remediation/compensation;Cueing hierarchy;Dysphagia/aspiration precaution  training;Functional tasks;Internal/external aids;Multimodal communication approach;Patient/family education;Speech/Language facilitation;Therapeutic Exercise    Pain Denies  SLP Evaluation Cognition Overall Cognitive Status: Impaired/Different from baseline Arousal/Alertness: Awake/alert Orientation Level: Oriented to person;Oriented to time;Oriented to place;Disoriented to situation Year: 2025 Month: December Day of Week: Correct Attention: Sustained Sustained Attention: Impaired (lethargic) Sustained Attention Impairment: Verbal basic;Functional basic Memory: Impaired Memory Impairment: Decreased short term memory Decreased Short Term Memory: Verbal basic Awareness: Impaired Awareness Impairment: Intellectual impairment;Emergent impairment Problem Solving: Impaired Problem Solving Impairment: Verbal basic Safety/Judgment: Impaired (reports feeling able to go home despite cognitive and physical deficits)  Comprehension Auditory Comprehension Overall Auditory Comprehension: Appears within functional limits for tasks assessed Expression Expression Primary Mode of Expression: Verbal Verbal Expression Overall Verbal Expression: Impaired Initiation: No impairment Level of Generative/Spontaneous Verbalization: Conversation Repetition: No impairment Naming: Impairment Confrontation: Impaired (occasional semantic paraphasias, circumlocution) Verbal Errors: Semantic paraphasias Oral Motor Oral Motor/Sensory Function Overall Oral Motor/Sensory Function: Moderate impairment Facial ROM: Reduced right;Suspected CN VII (facial) dysfunction Facial Symmetry: Abnormal symmetry right;Suspected CN VII (facial) dysfunction Facial Strength: Reduced right Lingual ROM: Suspected CN XII (hypoglossal) dysfunction Lingual Symmetry: Abnormal symmetry right;Suspected CN XII (hypoglossal) dysfunction Lingual Strength: Reduced Motor Speech Overall Motor Speech: Impaired Respiration: Within  functional limits Phonation: Low vocal intensity Resonance: Within functional limits Articulation: Impaired Level of Impairment: Conversation Intelligibility: Intelligibility reduced Conversation: 75-100% accurate Motor Planning: Within functional limits Effective Techniques: Over-articulate  Care Tool Care Tool Cognition Ability to hear (with hearing aid or hearing appliances if normally used Ability to hear (with hearing aid or hearing appliances if normally used): 0. Adequate - no difficulty in normal conservation, social interaction, listening to TV   Expression of Ideas and Wants Expression of Ideas and Wants: 3. Some difficulty - exhibits some difficulty with expressing needs and ideas (e.g, some words or finishing thoughts) or speech is not clear   Understanding Verbal and Non-Verbal Content Understanding Verbal and Non-Verbal Content: 4. Understands (complex and basic) - clear comprehension without cues or repetitions  Memory/Recall Ability  Memory/Recall Ability : Current season;That he or she is in a hospital/hospital unit    Bedside Swallowing Assessment General Previous Swallow Assessment: 12/9 MBS Diet Prior to this Study: Regular;Thin liquids (Level 0) Respiratory Status: Room air Behavior/Cognition: Cooperative;Lethargic/Drowsy Oral Cavity - Dentition: Dentures, top;Dentures, bottom Self-Feeding Abilities: Needs assist Vision: Functional for self-feeding Patient Positioning: Upright in bed Volitional Swallow: Able to elicit  Ice Chips Ice chips: Not tested Thin Liquid Thin Liquid: Impaired Presentation: Straw Pharyngeal  Phase Impairments: Cough - Immediate Other Comments: liquid wash Nectar Thick Nectar Thick Liquid: Not tested Honey Thick Honey Thick Liquid: Not tested Puree Puree: Not tested Solid Solid: Within functional limits Presentation: Self Fed BSE Assessment Risk for Aspiration Impact on safety and function: Moderate aspiration risk;Mild  aspiration risk Other Related Risk Factors: Previous CVA;Lethargy;Cognitive impairment  Short Term Goals: Week 1: SLP Short Term Goal 1 (Week 1): Patient will utilize swallowing compensatory strategies during consumption of least restrictive diet given min verbal A SLP Short Term Goal 2 (Week 1): Patient will demonstrate orientation to situation given min verbal A SLP Short Term Goal 3 (Week 1): Patient will name functional items with 100% accuracy given min multimodal A SLP Short Term Goal 4 (Week 1): Patient will recall daily events given mod multimodal A SLP Short Term Goal 5 (Week 1): Patient will demonstrate functional problem solving skills during daily tasks given mod multimodal A  Refer to Care Plan for Long Term Goals  Recommendations for other services: Neuropsych  Discharge Criteria: Patient will be discharged from SLP if patient refuses treatment 3 consecutive times without medical reason, if treatment goals not met, if there is a change in medical status, if patient makes no progress towards goals or if patient is discharged from hospital.  The above assessment, treatment plan, treatment alternatives and goals were discussed and mutually agreed upon: by patient  Tove Wideman M.A., CCC-SLP 07/20/2024, 10:07 AM

## 2024-07-20 NOTE — Plan of Care (Signed)
°  Problem: Consults Goal: RH STROKE PATIENT EDUCATION Description: See Patient Education module for education specifics  Outcome: Progressing Goal: Diabetes Guidelines if Diabetic/Glucose > 140 Description: If diabetic or lab glucose is > 140 mg/dl - Initiate Diabetes/Hyperglycemia Guidelines & Document Interventions  Outcome: Progressing   Problem: RH BOWEL ELIMINATION Goal: RH STG MANAGE BOWEL WITH ASSISTANCE Description: STG Manage Bowel with min Assistance. Outcome: Progressing   Problem: RH BLADDER ELIMINATION Goal: RH STG MANAGE BLADDER WITH ASSISTANCE Description: STG Manage Bladder With min Assistance Outcome: Progressing   Problem: RH SKIN INTEGRITY Goal: RH STG SKIN FREE OF INFECTION/BREAKDOWN Description: Manage skin free of infection with min assistance Outcome: Progressing   Problem: RH SAFETY Goal: RH STG ADHERE TO SAFETY PRECAUTIONS W/ASSISTANCE/DEVICE Description: STG Adhere to Safety Precautions With min Assistance/Device. Outcome: Progressing   Problem: RH PAIN MANAGEMENT Goal: RH STG PAIN MANAGED AT OR BELOW PT'S PAIN GOAL Description: <4 w/ prns Outcome: Progressing   Problem: RH KNOWLEDGE DEFICIT Goal: RH STG INCREASE KNOWLEDGE OF DIABETES Description: Manage increase knowledge diabetes with min assistance from daughter using educational materials provided Outcome: Progressing Goal: RH STG INCREASE KNOWLEDGE OF HYPERTENSION Description: Manage increase knowledge of hypertension   with min assistance from daughter using educational materials provided Outcome: Progressing Goal: RH STG INCREASE KNOWLEGDE OF HYPERLIPIDEMIA Description: Manage increase knowledge of hyperlipidemia  with min assistance from daughter using educational materials provided Outcome: Progressing Goal: RH STG INCREASE KNOWLEDGE OF STROKE PROPHYLAXIS Outcome: Progressing   Problem: Education: Goal: Ability to describe self-care measures that may prevent or decrease complications  (Diabetes Survival Skills Education) will improve Outcome: Progressing Goal: Individualized Educational Video(s) Outcome: Progressing   Problem: Coping: Goal: Ability to adjust to condition or change in health will improve Outcome: Progressing   Problem: Fluid Volume: Goal: Ability to maintain a balanced intake and output will improve Outcome: Progressing   Problem: Health Behavior/Discharge Planning: Goal: Ability to identify and utilize available resources and services will improve Outcome: Progressing Goal: Ability to manage health-related needs will improve Outcome: Progressing   Problem: Metabolic: Goal: Ability to maintain appropriate glucose levels will improve Outcome: Progressing   Problem: Nutritional: Goal: Maintenance of adequate nutrition will improve Outcome: Progressing Goal: Progress toward achieving an optimal weight will improve Outcome: Progressing   Problem: Skin Integrity: Goal: Risk for impaired skin integrity will decrease Outcome: Progressing   Problem: Tissue Perfusion: Goal: Adequacy of tissue perfusion will improve Outcome: Progressing

## 2024-07-20 NOTE — Plan of Care (Signed)
°  Problem: RH Balance Goal: LTG Patient will maintain dynamic standing with ADLs (OT) Description: LTG:  Patient will maintain dynamic standing balance with assist during activities of daily living (OT)  Flowsheets (Taken 07/20/2024 1253) LTG: Pt will maintain dynamic standing balance during ADLs with: Contact Guard/Touching assist   Problem: Sit to Stand Goal: LTG:  Patient will perform sit to stand in prep for activites of daily living with assistance level (OT) Description: LTG:  Patient will perform sit to stand in prep for activites of daily living with assistance level (OT) Flowsheets (Taken 07/20/2024 1253) LTG: PT will perform sit to stand in prep for activites of daily living with assistance level: Supervision/Verbal cueing   Problem: RH Grooming Goal: LTG Patient will perform grooming w/assist,cues/equip (OT) Description: LTG: Patient will perform grooming with assist, with/without cues using equipment (OT) Flowsheets (Taken 07/20/2024 1253) LTG: Pt will perform grooming with assistance level of: Set up assist    Problem: RH Bathing Goal: LTG Patient will bathe all body parts with assist levels (OT) Description: LTG: Patient will bathe all body parts with assist levels (OT) Flowsheets (Taken 07/20/2024 1253) LTG: Pt will perform bathing with assistance level/cueing: Contact Guard/Touching assist   Problem: RH Dressing Goal: LTG Patient will perform upper body dressing (OT) Description: LTG Patient will perform upper body dressing with assist, with/without cues (OT). Flowsheets (Taken 07/20/2024 1253) LTG: Pt will perform upper body dressing with assistance level of: Supervision/Verbal cueing Goal: LTG Patient will perform lower body dressing w/assist (OT) Description: LTG: Patient will perform lower body dressing with assist, with/without cues in positioning using equipment (OT) Flowsheets (Taken 07/20/2024 1253) LTG: Pt will perform lower body dressing with assistance level  of: Contact Guard/Touching assist   Problem: RH Toileting Goal: LTG Patient will perform toileting task (3/3 steps) with assistance level (OT) Description: LTG: Patient will perform toileting task (3/3 steps) with assistance level (OT)  Flowsheets (Taken 07/20/2024 1253) LTG: Pt will perform toileting task (3/3 steps) with assistance level: Contact Guard/Touching assist   Problem: RH Functional Use of Upper Extremity Goal: LTG Patient will use RT/LT upper extremity as a (OT) Description: LTG: Patient will use right/left upper extremity as a stabilizer/gross assist/diminished/nondominant/dominant level with assist, with/without cues during functional activity (OT) Flowsheets (Taken 07/20/2024 1253) LTG: Use of upper extremity in functional activities: RUE as diminished level   Problem: RH Laundry Goal: LTG Patient will perform laundry w/assist, cues (OT) Description: LTG: Patient will perform laundry with assistance, with/without cues (OT). Flowsheets (Taken 07/20/2024 1253) LTG: Pt will perform laundry with assistance level of: Minimal Assistance - Patient > 75%   Problem: RH Toilet Transfers Goal: LTG Patient will perform toilet transfers w/assist (OT) Description: LTG: Patient will perform toilet transfers with assist, with/without cues using equipment (OT) Flowsheets (Taken 07/20/2024 1253) LTG: Pt will perform toilet transfers with assistance level of: Contact Guard/Touching assist   Problem: RH Tub/Shower Transfers Goal: LTG Patient will perform tub/shower transfers w/assist (OT) Description: LTG: Patient will perform tub/shower transfers with assist, with/without cues using equipment (OT) Flowsheets (Taken 07/20/2024 1253) LTG: Pt will perform tub/shower stall transfers with assistance level of: Contact Guard/Touching assist   Problem: RH Awareness Goal: LTG: Patient will demonstrate awareness during functional activites type of (OT) Description: LTG: Patient will demonstrate  awareness during functional activites type of (OT) Flowsheets (Taken 07/20/2024 1253) Patient will demonstrate awareness during functional activites type of: Anticipatory LTG: Patient will demonstrate awareness during functional activites type of (OT): Supervision

## 2024-07-20 NOTE — Progress Notes (Signed)
 [facility] Individual Statement of Services  Patient Name:  Darlene Pugh  Date:  07/20/2024  Welcome to the [facility].  Our goal is to provide you with an individualized program based on your diagnosis and situation, designed to meet your specific needs.  With this comprehensive rehabilitation program, you will be expected to participate in at least 3 hours of rehabilitation therapies Monday-Friday, with modified therapy programming on the weekends.  Your rehabilitation program will include the following services:  Physical Therapy (PT), Occupational Therapy (OT), Speech Therapy (ST), 24 hour per day rehabilitation nursing, Therapeutic Recreaction (TR), Neuropsychology, Care Coordinator, Rehabilitation Medicine, Nutrition Services, and Pharmacy Services  Weekly team conferences will be held on Wednesday to discuss your progress.  Your Inpatient Rehabilitation Care Coordinator will talk with you frequently to get your input and to update you on team discussions.  Team conferences with you and your family in attendance may also be held.  Expected length of stay: 2-3 weeks  Overall anticipated outcome: supervision/CGA level  Depending on your progress and recovery, your program may change. Your Inpatient Rehabilitation Care Coordinator will coordinate services and will keep you informed of any changes. Your Inpatient Rehabilitation Care Coordinator's name and contact numbers are listed  below.  The following services may also be recommended but are not provided by the [facility]:  Driving Evaluations Home Health Rehabiltiation Services Outpatient Rehabilitation Services    Arrangements will be made to provide these services after discharge if needed.  Arrangements include referral to agencies that provide these services.  Your insurance has been verified to be:  [facility] [place] Medicaid Your primary doctor is: Johnston Olea  Pertinent information will be shared with your doctor and your insurance company.  Inpatient Rehabilitation Care Coordinator:  Rhoda Clement, KEN [phone] or ELIGAH BASQUES  Information discussed with and copy given to patient by: Clement Asberry MATSU, 07/20/2024, 10:07 AM

## 2024-07-20 NOTE — Evaluation (Signed)
 Physical Therapy Assessment and Plan  Patient Details  Name: Darlene Pugh MRN: [id number] Date of Birth: [birth year]  PT Diagnosis: Abnormal posture, Abnormality of gait, Difficulty walking, Hemiplegia dominant, Impaired cognition, and Muscle weakness Rehab Potential: Good ELOS: 2-3 weeks   Today's Date: 07/20/2024 PT Individual Time: 1315-1415 PT Individual Time Calculation (min): 60 min    Hospital Problem: Principal Problem:   Infarction of left basal ganglia (HCC)   Past Medical History:  Past Medical History:  Diagnosis Date   Diabetes mellitus without complication (HCC)    Hypertension    Past Surgical History:  Past Surgical History:  Procedure Laterality Date   LEFT HEART CATH AND CORONARY ANGIOGRAPHY N/A 08/02/2017   Procedure: LEFT HEART CATH AND CORONARY ANGIOGRAPHY;  Surgeon: Darlene Hutching, MD;  Location: [facility];  Service: Cardiovascular;  Laterality: N/A;    Assessment & Plan Clinical Impression: Darlene Pugh is a 63 year old right-handed female with history significant for depression, hyperlipidemia, alcohol use, hypertension, diabetes mellitus as well as tobacco use as well as history of CVA with residual left facial numbness maintained on aspirin  as well as Brilinta . Per chart review patient lives alone. Independent prior to admission and driving. 1 level home 4 steps to entry. Presented 07/10/2024 after being found down on the ground by her property manager with facial droop dysarthria and weakness. She endorses 2 recent falls without loss of consciousness. Blood pressure on admission 160/99. CTA showed no evidence of emergent large vessel occlusion. 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 MRI showed acute infarct involving the left internal capsule/basal ganglia. Patient did not receive TNK. LVO negative. Admission chemistries  unremarkable except potassium 3.4 glucose 218, alcohol negative, total CK of 172, hemoglobin A1c 9.5. Echocardiogram ejection fraction of 55 to 60% no wall motion abnormalities. Neurology follow-up currently maintained on aspirin  81 mg daily and Plavix  75 mg daily for 3 weeks then Plavix  alone. She has been placed on Xarelto  10 mg daily for 30 days for VTE prophylaxis. She did have a urine culture greater than 100,000 colonies yeast and completed 3-day course of Rocephin . Patient reports she had left knee pain due to the fall before admission. Patient reports this has been gradually improving. Appears she was treated with Ace wrap's, ice packs and Toradol . She had an x-ray with mild lateral compartmental degeneration arthritis and moderate joint effusion. Her diet has been advanced to a regular consistency. Therapy evaluations completed due to patient's decreased functional mobility was admitted for a comprehensive rehab program.   Patient currently requires mod with mobility secondary to muscle weakness and decreased coordination and decreased motor planning.  Prior to hospitalization, patient was independent  with mobility and lived with Alone in a Apartment home.  Home access is 4Stairs to enter.  Patient will benefit from skilled PT intervention to maximize safe functional mobility, minimize fall risk, and decrease caregiver burden for planned discharge home with 24 hour supervision.  Anticipate patient will benefit from follow up HH at discharge.  PT - End of Session Activity Tolerance: Tolerates 10 - 20 min activity with multiple rests Endurance Deficit: Yes PT Assessment Rehab Potential (ACUTE/IP ONLY): Good PT Barriers to Discharge: Inaccessible home environment PT Patient demonstrates impairments in the following area(s): Balance;Safety;Endurance;Motor PT Transfers Functional Problem(s): Bed Mobility;Bed to Chair;Car;Furniture PT Locomotion Functional Problem(s): Ambulation;Wheelchair  Mobility;Stairs PT Plan PT Intensity: Minimum of 1-2 x/day ,45 to 90 minutes  PT Frequency: 5 out of 7 days PT Duration Estimated Length of Stay: 2-3 weeks PT Treatment/Interventions: Ambulation/gait training;Community reintegration;Neuromuscular re-education;Stair training;UE/LE Strength taining/ROM;Wheelchair propulsion/positioning;UE/LE Coordination activities;Therapeutic Activities;Balance/vestibular training;Discharge planning;Functional mobility training;Patient/family education PT Transfers Anticipated Outcome(s): supervision PT Locomotion Anticipated Outcome(s): CGA w/ LRAD PT Recommendation Recommendations for Other Services: None Follow Up Recommendations: Home health PT Patient destination: Home Equipment Recommended: To be determined   PT Evaluation Precautions/Restrictions Precautions Precautions: Fall Recall of Precautions/Restrictions: Impaired Restrictions Weight Bearing Restrictions Per Provider Order: No General PT Amount of Missed Time (min): 15 Minutes PT Missed Treatment Reason: Other (Comment) (pt eating lunch.) Vital SignsTherapy Vitals Temp: 98.1 F (36.7 C) Temp Source: Oral Pulse Rate: 94 Resp: 18 BP: 101/76 Patient Position (if appropriate): Sitting Oxygen Therapy SpO2: 95 % O2 Device: Room Air Pain Pain Assessment Pain Scale: 0-10 Pain Score: 0-No pain Pain Interference Pain Interference Pain Effect on Sleep: 1. Rarely or not at all;0. Does not apply - I have not had any pain or hurting in the past 5 days Pain Interference with Therapy Activities: 0. Does not apply - I have not received rehabilitationtherapy in the past 5 days Pain Interference with Day-to-Day Activities: 1. Rarely or not at all Home Living/Prior Functioning Home Living Available Help at Discharge: Family;Available PRN/intermittently Type of Home: Apartment Home Access: Stairs to enter Entrance Stairs-Number of Steps: 4 Entrance Stairs-Rails: Can reach both Home Layout:  One level Bathroom Shower/Tub: Engineer, Manufacturing Systems: Standard Bathroom Accessibility: Yes Additional Comments: pt states taking baths and showers PTA, tub seat available.  Lives With: Alone Prior Function Level of Independence: Independent with transfers;Independent with gait  Able to Take Stairs?: Reciprically Driving: Yes Vocation: On disability Vision/Perception  Vision - History Ability to See in Adequate Light: 0 Adequate Vision - Assessment Eye Alignment: Within Functional Limits Ocular Range of Motion: Restricted on the right Alignment/Gaze Preference: Head turned Tracking/Visual Pursuits: Decreased smoothness of vertical tracking;Decreased smoothness of horizontal tracking;Impaired - to be further tested in functional context;Other (comment) (decreased smoothness during tracking, however Pt reports no change from baseline) Saccades: Undershoots;Impaired - to be further tested in functional context;Decreased speed of saccadic movement (undershoots with slowed movements looking R) Convergence: Within functional limits Perception Perception: Impaired Preception Impairment Details: Inattention/Neglect Perception-Other Comments: Mild R inattention with L Gaze preference Praxis Praxis: Impaired Praxis Impairment Details: Motor planning  Cognition Overall Cognitive Status: Impaired/Different from baseline Arousal/Alertness: Awake/alert Attention: Sustained Sustained Attention: Impaired Sustained Attention Impairment: Verbal basic;Functional basic Memory: Impaired Memory Impairment: Decreased short term memory Decreased Short Term Memory: Verbal basic Awareness: Impaired Awareness Impairment: Intellectual impairment;Emergent impairment Problem Solving: Impaired Problem Solving Impairment: Verbal basic Safety/Judgment: Impaired Sensation Sensation Light Touch: Appears Intact Light Touch Impaired Details: Impaired RUE;Impaired RLE Hot/Cold: Not  tested Proprioception: Impaired Detail Proprioception Impaired Details: Impaired RUE;Impaired RLE (mild) Stereognosis: Not tested Coordination Gross Motor Movements are Fluid and Coordinated: No Fine Motor Movements are Fluid and Coordinated: No Coordination and Movement Description: Decreased coordination on R hemibody d/t hemiplegia Finger Nose Finger Test: Slowed on R Heel Shin Test: slowed R Motor  Motor Motor: Hemiplegia Motor - Skilled Clinical Observations: R hemiplegia wiht slowed movements   Trunk/Postural Assessment  Cervical Assessment Cervical Assessment: Within Functional Limits Thoracic Assessment Thoracic Assessment: Exceptions to [facility] (rounded shoulders) Lumbar Assessment Lumbar Assessment: Exceptions to [facility] (posterior pelvic tilt.) Postural Control Postural Control: Deficits on evaluation Righting Reactions: Decreased Protective Responses: Delayed  Balance Balance Balance Assessed: Yes Static Sitting Balance Static Sitting - Balance Support: Feet supported Static  Sitting - Level of Assistance: 5: Stand by assistance Dynamic Sitting Balance Sitting balance - Comments: CGA to supervision donning pants over feet w/ Figure-4 position. Static Standing Balance Static Standing - Balance Support: Bilateral upper extremity supported;During functional activity Static Standing - Level of Assistance: 4: Min assist Dynamic Standing Balance Dynamic Standing - Balance Support: During functional activity;Bilateral upper extremity supported Dynamic Standing - Level of Assistance: 4: Min assist;3: Mod assist Extremity Assessment  RUE Assessment RUE Assessment: Exceptions to [facility] Passive Range of Motion (PROM) Comments: WFL Active Range of Motion (AROM) Comments: decreased shoudler flexion ~90* General Strength Comments: 3+/5 overall, slowed motor movements LUE Assessment LUE Assessment: Within Functional Limits RLE Assessment RLE Assessment: Exceptions to [facility]  Strength Comments: grossly 4/5, except hip flexion 3-/5 LLE Assessment LLE Assessment: Within Functional Limits  Care Tool Care Tool Bed Mobility Roll left and right activity   Roll left and right assist level: Minimal Assistance - Patient > 75%    Sit to lying activity   Sit to lying assist level: Minimal Assistance - Patient > 75%    Lying to sitting on side of bed activity   Lying to sitting on side of bed assist level: the ability to move from lying on the back to sitting on the side of the bed with no back support.: Minimal Assistance - Patient > 75%     Care Tool Transfers Sit to stand transfer   Sit to stand assist level: Moderate Assistance - Patient 50 - 74%    Chair/bed transfer   Chair/bed transfer assist level: Moderate Assistance - Patient 50 - 74%    Car transfer   Car transfer assist level: Minimal Assistance - Patient > 75%      Care Tool Locomotion Ambulation   Assist level: Moderate Assistance - Patient 50 - 74% Assistive device: Walker-rolling Max distance: 11  Walk 10 feet activity   Assist level: Moderate Assistance - Patient - 50 - 74% Assistive device: Walker-rolling   Walk 50 feet with 2 turns activity Walk 50 feet with 2 turns activity did not occur: Safety/medical concerns      Walk 150 feet activity Walk 150 feet activity did not occur: Safety/medical concerns      Walk 10 feet on uneven surfaces activity Walk 10 feet on uneven surfaces activity did not occur: Safety/medical concerns      Stairs Stair activity did not occur: Safety/medical concerns        Walk up/down 1 step activity Walk up/down 1 step or curb (drop down) activity did not occur: Safety/medical concerns      Walk up/down 4 steps activity Walk up/down 4 steps activity did not occur: Safety/medical concerns      Walk up/down 12 steps activity Walk up/down 12 steps activity did not occur: Safety/medical concerns      Pick up small objects from floor   Pick up small  object from the floor assist level: Moderate Assistance - Patient 50 - 74%    Wheelchair Is the patient using a wheelchair?: Yes Type of Wheelchair: Manual   Wheelchair assist level: Moderate Assistance - Patient 50 - 74% Max wheelchair distance: 30  Wheel 50 feet with 2 turns activity   Assist Level: Maximal Assistance - Patient 25 - 49%  Wheel 150 feet activity   Assist Level: Dependent - Patient 0%    Refer to Care Plan for Long Term Goals  SHORT TERM GOAL WEEK 1 PT Short Term Goal 1 (Week 1):  Pt will transfer sup to sit w/ CGA consistently w/o bed features. PT Short Term Goal 2 (Week 1): Pt will transfer sit to stand w/ min A consistently. PT Short Term Goal 3 (Week 1): Pt will amb x 25' w/ RW and min A PT Short Term Goal 4 (Week 1): PT to assess stairs.  Recommendations for other services: None   Skilled Therapeutic Intervention Evaluation completed (see details above and below) with education on PT POC and goals and individual treatment initiated with focus on  endurance, strengthening, NMR R, transfers, gait and rogress to stairs.  Pt presents almost 90 degrees in bed eating lunch.  Pt returned in 15' and pt agreeable to therapy.  Pt sitting long sitting w/ LLE off EOB.  Pt scooted to EOB w/ min A and cues.  Pt given pants and pt performed doffing by transitioning to Figure-4 position for each leg w/ supervision only and no retropulsion.  Pt transfers sit to stand w/ mod A (throughout session because continues to push on RW w/ B hands even though cued for hand placement) and then completes pulling over hips w/ CGA for steadying.  Pt performs step-pivot to w/c w/ mod/min A and cues for sequencing.  Pt able to push w/c w/ L hand x 30' w/ Mod A and cueing for objects to Right.  Pt unable to reach floor w/ L foot.  Pt amb x 11' to SUV height car simulation w/ mod A and faciltation for wt shift L to advance RLE.  Pt w/ decreased foot clearance R.  Pt required only min A to bring LES into  car although incrased cues for safe approach to car.  Pt able to pick up beanbag from floor w/ max/mod A and cues. Using R hand.  Pt performed step-pivot w/c <> bed w/ mod A .  Sit to supine w/ min A for RLE and bridges to center of bed.  Pt requests to sit in w/c so transfers to sitting w/ min A and mod A for step-pivot to L side.  Chair alarm donned to w/c and notified nursing of missing alarm box and retrieving one.  All needs in reach.     Mobility Bed Mobility Bed Mobility: Rolling Left;Sit to Supine;Left Sidelying to Sit Rolling Left: Contact Guard/Touching assist (w/ siderail.) Left Sidelying to Sit: Minimal Assistance - Patient >75% Supine to Sit: Minimal Assistance - Patient > 75% Sit to Supine: Moderate Assistance - Patient 50-74% Transfers Transfers: Sit to Stand;Stand to Sit;Stand Pivot Transfers Sit to Stand: Minimal Assistance - Patient > 75%;Moderate Assistance - Patient 50-74% Stand to Sit: Minimal Assistance - Patient > 75%;Moderate Assistance - Patient 50-74% Stand Pivot Transfers: Minimal Assistance - Patient > 75% Stand Pivot Transfer Details: Verbal cues for precautions/safety;Verbal cues for sequencing Transfer (Assistive device): None (verbal and visual cues for hand placement and sequencing.) Locomotion  Gait Ambulation: Yes Gait Distance (Feet): 11 Feet Assistive device: Rolling walker Gait Gait: Yes Gait Pattern: Step-to pattern;Decreased dorsiflexion - right;Decreased weight shift to left;Decreased step length - right;Decreased stance time - right;Trunk flexed Gait velocity: decreased Stairs / Additional Locomotion Stairs: No Wheelchair Mobility Wheelchair Mobility: Yes Wheelchair Assistance: Moderate Assistance - Patient 50 - 74% Wheelchair Propulsion: Left upper extremity;Left lower extremity Wheelchair Parts Management: Needs assistance Distance: 30   Discharge Criteria: Patient will be discharged from PT if patient refuses treatment 3 consecutive  times without medical reason, if treatment goals not met, if there is a change in medical status, if  patient makes no progress towards goals or if patient is discharged from hospital.  The above assessment, treatment plan, treatment alternatives and goals were discussed and mutually agreed upon: by patient and by family  Reyes SHAUNNA Sierra 07/20/2024, 4:08 PM

## 2024-07-20 NOTE — Plan of Care (Signed)
°  Problem: Consults Goal: RH STROKE PATIENT EDUCATION Description: See Patient Education module for education specifics  Outcome: Progressing Goal: Diabetes Guidelines if Diabetic/Glucose > 140 Description: If diabetic or lab glucose is > 140 mg/dl - Initiate Diabetes/Hyperglycemia Guidelines & Document Interventions  Outcome: Progressing   Problem: RH BOWEL ELIMINATION Goal: RH STG MANAGE BOWEL WITH ASSISTANCE Description: STG Manage Bowel with min Assistance. Outcome: Progressing   Problem: RH BLADDER ELIMINATION Goal: RH STG MANAGE BLADDER WITH ASSISTANCE Description: STG Manage Bladder With min Assistance Outcome: Progressing   Problem: RH SKIN INTEGRITY Goal: RH STG SKIN FREE OF INFECTION/BREAKDOWN Description: Manage skin free of infection with min assistance Outcome: Progressing   Problem: RH SAFETY Goal: RH STG ADHERE TO SAFETY PRECAUTIONS W/ASSISTANCE/DEVICE Description: STG Adhere to Safety Precautions With min Assistance/Device. Outcome: Progressing   Problem: RH PAIN MANAGEMENT Goal: RH STG PAIN MANAGED AT OR BELOW PT'S PAIN GOAL Description: <4 w/ prns Outcome: Progressing   Problem: RH KNOWLEDGE DEFICIT Goal: RH STG INCREASE KNOWLEDGE OF DIABETES Description: Manage increase knowledge diabetes with min assistance from daughter using educational materials provided Outcome: Progressing Goal: RH STG INCREASE KNOWLEDGE OF HYPERTENSION Description: Manage increase knowledge of hypertension   with min assistance from daughter using educational materials provided Outcome: Progressing Goal: RH STG INCREASE KNOWLEGDE OF HYPERLIPIDEMIA Description: Manage increase knowledge of hyperlipidemia  with min assistance from daughter using educational materials provided Outcome: Progressing Goal: RH STG INCREASE KNOWLEDGE OF STROKE PROPHYLAXIS Outcome: Progressing

## 2024-07-20 NOTE — Progress Notes (Signed)
 Patient blood sugar noted to be high at 348, provider has been notified. To place orders.

## 2024-07-20 NOTE — Progress Notes (Signed)
 Inpatient Rehabilitation Care Coordinator Assessment and Plan Patient Details  Name: Darlene Pugh MRN: [id number] Date of Birth: 1960/08/13  Today's Date: 07/20/2024  Hospital Problems: Principal Problem:   Infarction of left basal ganglia [facility])  Past Medical History:  Past Medical History:  Diagnosis Date   Diabetes mellitus without complication (HCC)    Hypertension    Past Surgical History:  Past Surgical History:  Procedure Laterality Date   LEFT HEART CATH AND CORONARY ANGIOGRAPHY N/A 08/02/2017   Procedure: LEFT HEART CATH AND CORONARY ANGIOGRAPHY;  Surgeon: Levern Hutching, MD;  Location: [facility];  Service: Cardiovascular;  Laterality: N/A;   Social History:  reports that she has been smoking cigarettes. She has never used smokeless tobacco. She reports that she does not drink alcohol and does not use drugs.  Family / Support Systems Marital Status: Single Patient Roles: Parent, Other (Comment) (sibling/retiree) Children: Latoya-daughter [phone]  Lanesha-daughter [phone] Has another daughter also all are local-Latoya is the spokesperson Other Supports: Daphaine-sister [phone] Anticipated Caregiver: All will pull together to assist pt at home and hope she does well here Ability/Limitations of Caregiver: All work but will try to see what they can do to assist. Caregiver Availability: Other (Comment) (Need to come up with a plan-all work.) Family Dynamics: Close with daughter's and they are local which helps. Pt was very independent prior to this and was driving still. She does have a history of CVA but managed  Social History Preferred language: English Religion: Baptist Cultural Background: NA Education: HS Health Literacy - How often do you need to have someone help you when you read instructions, pamphlets, or other written material from your doctor or pharmacy?: Never Writes: Yes Employment Status: Disabled Marine Scientist  Issues: No issues Guardian/Conservator: None-according to MD pt is capable of making her own decisions while here. Her daughter's will be here to visit to provide support   Abuse/Neglect Abuse/Neglect Assessment Can Be Completed: Yes Physical Abuse: Denies Verbal Abuse: Denies Sexual Abuse: Denies Exploitation of patient/patient's resources: Denies Self-Neglect: Denies  Patient response to: Social Isolation - How often do you feel lonely or isolated from those around you?: Never  Emotional Status Pt's affect, behavior and adjustment status: Pt is hoping she can recover like she did before and get back to her apartment and not need help. She reports her family hopes this too and will need to come up with a plan for her if not. 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 She does talk with the MD's involved and feels understands her plan. She hopes her leg and arm get stronger and she can take care of herself when she leaves here. Premorbid pt/family roles/activities: mom, siblng, grandmother, retiree, friend, etc Anticipated changes in roles/activities/participation: resume Pt/family expectations/goals: Pt states:  I hope to take care of myself when I leave here.  Community [employer] Agencies: None Premorbid Home Care/DME Agencies: Other (Comment) (walker, cane, tub seat) Transportation available at discharge: self and  family  Is the patient able to respond to transportation needs?: Yes In the past 12 months, has lack of transportation kept you from medical appointments or from getting medications?: No In the past 12 months, has lack of transportation kept you from meetings, work, or from getting things needed for daily living?: No Resource referrals recommended: Neuropsychology  Discharge Planning Living Arrangements: Alone Support Systems: Children, Other relatives, Friends/neighbors Type of Residence: Private residence Insurance Resources: [employer] (specify) ([employer] Medicaid) Financial Resources: SSD Financial Screen Referred: No Living Expenses: Rent Money Management: Patient Does the patient have any problems obtaining your medications?: No Home Management: self Patient/Family Preliminary Plans: Return home to her apartment and have family come toi her, unsure if has 24/7 in place due to all work and will need to come up with a plan if this is needed. Aware being evaluated and goals being set for stay here. Team conference on Wed will update then. Care Coordinator Barriers to Discharge: Insurance for SNF coverage Care Coordinator Anticipated Follow Up Needs: HH/OP  Clinical Impression Pleasant female who is motivated to do well and recover from this stroke. She has in the past. She has three daughter's and a sister who are involved but work. Have placed on neuro-psych list due to history of depression and substance issues to be seen next week. Will await team's evaluations and work on discharge needs. Await daughter-Latoya's return call  Raymonde Asberry MATSU 07/20/2024, 10:05 AM

## 2024-07-21 ENCOUNTER — Inpatient Hospital Stay (HOSPITAL_COMMUNITY)

## 2024-07-21 DIAGNOSIS — K5900 Constipation, unspecified: Secondary | ICD-10-CM | POA: Diagnosis not present

## 2024-07-21 DIAGNOSIS — E1169 Type 2 diabetes mellitus with other specified complication: Secondary | ICD-10-CM | POA: Diagnosis not present

## 2024-07-21 LAB — GLUCOSE, CAPILLARY
Glucose-Capillary: 122 mg/dL — ABNORMAL HIGH (ref 70–99)
Glucose-Capillary: 135 mg/dL — ABNORMAL HIGH (ref 70–99)
Glucose-Capillary: 114 mg/dL — ABNORMAL HIGH (ref 70–99)
Glucose-Capillary: 142 mg/dL — ABNORMAL HIGH (ref 70–99)

## 2024-07-21 NOTE — Progress Notes (Signed)
 Physical Therapy Session Note  Patient Details  Name: Darlene Pugh MRN: [id number] Date of Birth: [birth year]  Today's Date: 07/21/2024 PT Individual Time: 1101-1135 PT Individual Time Calculation (min): 34 min  and Today's Date: 07/21/2024 PT Missed Time: 11 Minutes Missed Time Reason: Patient fatigue  Short Term Goals: Week 1:  PT Short Term Goal 1 (Week 1): Pt will transfer sup to sit w/ CGA consistently w/o bed features. PT Short Term Goal 2 (Week 1): Pt will transfer sit to stand w/ min A consistently. PT Short Term Goal 3 (Week 1): Pt will amb x 25' w/ RW and min A PT Short Term Goal 4 (Week 1): PT to assess stairs.  Skilled Therapeutic Interventions/Progress Updates:  Patient seated upright in w/c on entrance to room. Patient alert and not initially agreeable to PT session. Pt relates surprise that she has had little rest between sessions this morning and does not understand why. Provided pt with reasoning re: staff numbers and scheduling with all current patients that sometimes schedules get squeezed together but that after her one session in afternoon, she will be done with therapy for the day.   Patient with no pain complaint at start of session.  Therapeutic Activity/ NMR: Spent time in conversation re: family, home setup, caregiver availability upon d/c all in order to build rapport and therapeutic alliance for improved buy-in/participation with therapy. Transfers: Pt performed sit<>stand transfer and cued to stand with no UE support. Pt stands but grabs LUE to edge of sink. Rises to stand with CGA/ MinA. Maintains balance with light CGA. Guided pt in weight shifting to assess as well as build confidence in RLE strength. Then progressed to minisquats perfomed 2x5reps. Cued to increase amount of flexion with minimal change demo'd from pt. Relates fatigue and allowed to rest in seated position.   NMR performed for improvements in motor control and coordination, balance,  sequencing, judgement, and self confidence/ efficacy in performing all aspects of mobility at highest level of independence.   Patient seated upright in w/c at end of session with brakes locked, belt alarm set, and all needs within reach.  Therapy Documentation Precautions:  Precautions Precautions: Fall Recall of Precautions/Restrictions: Impaired Restrictions Weight Bearing Restrictions Per Provider Order: No  Pain: Pain Assessment Pain Scale: 0-10 Pain Score: 0-No pain this session.    Therapy/Group: Individual Therapy  [name] PT, DPT, CSRS 07/21/2024, 12:28 PM

## 2024-07-21 NOTE — Plan of Care (Signed)
°  Problem: Consults Goal: RH STROKE PATIENT EDUCATION Description: See Patient Education module for education specifics  Outcome: Progressing Goal: Diabetes Guidelines if Diabetic/Glucose > 140 Description: If diabetic or lab glucose is > 140 mg/dl - Initiate Diabetes/Hyperglycemia Guidelines & Document Interventions  Outcome: Progressing   Problem: RH BOWEL ELIMINATION Goal: RH STG MANAGE BOWEL WITH ASSISTANCE Description: STG Manage Bowel with min Assistance. Outcome: Progressing   Problem: RH BLADDER ELIMINATION Goal: RH STG MANAGE BLADDER WITH ASSISTANCE Description: STG Manage Bladder With min Assistance Outcome: Progressing   Problem: RH SKIN INTEGRITY Goal: RH STG SKIN FREE OF INFECTION/BREAKDOWN Description: Manage skin free of infection with min assistance Outcome: Progressing   Problem: RH SAFETY Goal: RH STG ADHERE TO SAFETY PRECAUTIONS W/ASSISTANCE/DEVICE Description: STG Adhere to Safety Precautions With min Assistance/Device. Outcome: Progressing   Problem: RH PAIN MANAGEMENT Goal: RH STG PAIN MANAGED AT OR BELOW PT'S PAIN GOAL Description: <4 w/ prns Outcome: Progressing   Problem: RH KNOWLEDGE DEFICIT Goal: RH STG INCREASE KNOWLEDGE OF DIABETES Description: Manage increase knowledge diabetes with min assistance from daughter using educational materials provided Outcome: Progressing Goal: RH STG INCREASE KNOWLEDGE OF HYPERTENSION Description: Manage increase knowledge of hypertension   with min assistance from daughter using educational materials provided Outcome: Progressing Goal: RH STG INCREASE KNOWLEGDE OF HYPERLIPIDEMIA Description: Manage increase knowledge of hyperlipidemia  with min assistance from daughter using educational materials provided Outcome: Progressing Goal: RH STG INCREASE KNOWLEDGE OF STROKE PROPHYLAXIS Outcome: Progressing

## 2024-07-21 NOTE — Progress Notes (Signed)
 Physical Therapy Session Note  Patient Details  Name: Darlene Pugh MRN: [id number] Date of Birth: 1961/02/25  Today's Date: 07/21/2024 PT Individual Time: 8696-8656 PT Individual Time Calculation (min): 40 min   Short Term Goals: Week 1:  PT Short Term Goal 1 (Week 1): Pt will transfer sup to sit w/ CGA consistently w/o bed features. PT Short Term Goal 2 (Week 1): Pt will transfer sit to stand w/ min A consistently. PT Short Term Goal 3 (Week 1): Pt will amb x 25' w/ RW and min A PT Short Term Goal 4 (Week 1): PT to assess stairs.  Skilled Therapeutic Interventions/Progress Updates:  Patient seated upright in w/c on entrance to room. Patient alert and not initially agreeable to PT session. Does not remember therapist from 1.5 hrs ago.   Patient with no pain complaint at start of session.  Related to pt time of therapy session and expectation to go to therapy gym. Pt relates that she told NT she had soiled her brief and needed to be changed. Related to pt that therapist can assist and pt taken into bathroom. Pt immediately resistant to assist and demos lack of understanding. Complains that this therapist is expecting her to do this and do that all by herself. Explained intention of therapist to assist pt to stand then turn and sit to toilet. Pt says she has not been to bathroom and nursing helps her while in the bed. Educated pt that she is in rehab department in order to work on ADLs and mobility in order to improve in oder to return home as close to [facility] as possible. Pt continues to argue and relate lack of understanding of context of rehab. In one moment relates that she fell in bathroom at home and can't be expected to use bathroom by herself here. Also relates that R knee gives out. And informs therapist that imaging had come recently and required pt to stand in order to take x-rays as per MD order. So, pt is tired and will fall.  Again, this therapist relates that therapy staff  are here to work on her ability to be able to reach toilet. She will need assistance for now but will hopefully progress soon to ability to do more for herself. Pt continues to argue that she cannot be expected to do everything for herself demonstrating variable understanding of impairments and context of her location.   Finally agreed to allow pt to return to bed for pericare and brief change. NT arrives and relates that pt stood at RW earlier and allowed NT to provide assist while standing. Pt does not remember.   She stands at [facility] with CGA/ MinA and no demo of RLE weakness. NT able to doff/ then don new brief after pericare.   Pt sits to w/c and therapist relates understanding that pt is fatigued. So asked pt if she would prefer to sit in w/c or return to bed since her therapy schedule is completed for day. Pt upset and relates that she is tired from everyone wanting her to do this and do that all by herself... therapist stops pt and relates that pt's response is inappropriate as no one has asked her to do anything by herself and only asked re: her fatigue level and desire to lie down or remain sitting up. Pt wants to return to bed. Positioned next to bed and pt questions RW in front of her and then relates desire to stay seated in w/c.  Noted RW is too tall for pt and so located more appropriate RW size for her height and left in room for her to use.   On return Rn is attempting to provide pt with insulin  injection d/t high blood sugar. Pt incorrectly calls this a high blood pressure. Pt relates that she never had to do this at home as she takes metformin . Related to pt that she is still taking metformin  and RN in agreement. Educated that sometimes after stroke, body systems change in ability to maintain correct systems and for now will have to take more medication until body is able to stabilize. Pt not happy re: increase in medication and new injections.   Patient seated upright in w/c at end of  session with brakes locked, belt alarm set, and all needs within reach. RN present.    Therapy Documentation Precautions:  Precautions Precautions: Fall Recall of Precautions/Restrictions: Impaired Restrictions Weight Bearing Restrictions Per Provider Order: No  Pain:  No pain related this session.    Therapy/Group: Individual Therapy  [name] PT, DPT, CSRS 07/21/2024, 4:56 PM

## 2024-07-21 NOTE — Progress Notes (Addendum)
 Speech Language Pathology Daily Session Note  Patient Details  Name: Darlene Pugh MRN: [id number] Date of Birth: 02/03/1961  Today's Date: 07/21/2024 SLP Individual Time: 0900-1000 SLP Individual Time Calculation (min): 60 min  Short Term Goals: Week 1: SLP Short Term Goal 1 (Week 1): Patient will utilize swallowing compensatory strategies during consumption of least restrictive diet given min verbal A SLP Short Term Goal 2 (Week 1): Patient will demonstrate orientation to situation given min verbal A SLP Short Term Goal 3 (Week 1): Patient will name functional items with 100% accuracy given min multimodal A SLP Short Term Goal 4 (Week 1): Patient will recall daily events given mod multimodal A SLP Short Term Goal 5 (Week 1): Patient will demonstrate functional problem solving skills during daily tasks given mod multimodal A  Skilled Therapeutic Interventions: Skilled therapy session focused on communication and cognitive goals. Patient initially reluctant to participate in session, requiring encouragement form SLP. SLP targeted communication goals through introducting IOPI. Patient with max anterior kpa of 33.4 and posterior kpa of 27.6. Patient then completed 20 repetitions of IOPI at 26kpa anteriorly and 21kpa posteriorly. SLP then introduced SLOP speech intelligibility strategies and encouraged use at the phrase level. Patient with 80% intelligibility given mod-maxA to increase vocal intensity. SLP targeted dysphagia goals through review of strategies (oral hold for 2 seconds with liquids, small bites/sips) and observing patient with medications crushed in applesauce (per preference). No s/sx of aspiration present. Continue current medication administration regime. Patient left in bed with call bell in reach. Continue POC  Pain None reported   Therapy/Group: Individual Therapy  Patience Nuzzo M.A., CCC-SLP 07/21/2024, 7:36 AM

## 2024-07-21 NOTE — Progress Notes (Signed)
 PROGRESS NOTE   Subjective/Complaints:  Reports abdominal discomfort unable to elaborate. CBGs were high yesterday.   Review of systems negative chest pain shortness of breath nausea vomiting diarrhea or constipation. + abdominal discomfort Objective:   No results found.  Recent Labs    07/20/24 0441  WBC 8.3  HGB 15.1*  HCT 44.5  PLT 378   Recent Labs    07/20/24 0441  NA 137  K 5.0  CL 99  CO2 25  GLUCOSE 126*  BUN 23  CREATININE 0.69  CALCIUM  9.2    Intake/Output Summary (Last 24 hours) at 07/21/2024 0944 Last data filed at 07/20/2024 1749 Gross per 24 hour  Intake 170 ml  Output --  Net 170 ml        Physical Exam: Vital Signs Blood pressure 130/78, pulse 95, temperature 98 F (36.7 C), temperature source Oral, resp. rate 18, height 5' 4 (1.626 m), weight 74 kg, SpO2 98%.  General: No acute distress, appears comfortable laying in bed  Mood and affect are appropriate Heart: Regular rate and rhythm no rubs murmurs or extra sounds Lungs: Clear to auscultation, breathing unlabored, no rales or wheezes Abdomen: Positive bowel sounds, soft nontender to palpation, nondistended Extremities: No clubbing, cyanosis, or edema Skin: No evidence of breakdown, no evidence of rash Neurologic: Cranial nerves II through XII intact, motor strength is 5/5 in left and 3 -/5 right deltoid, bicep, tricep, grip, hip flexor, knee extensors, 5/5 left and 2 -/5 right ankle dorsiflexor and plantar flexor Sensory exam normal sensation to light touch in bilateral upper and lower extremities Cerebellar exam normal finger to nose to finger as well as heel to shin in left upper and lower extremities, right upper extremity testing limited by weakness Musculoskeletal: Full range of motion in all 4 extremities. No joint swelling, no pain with range of motion Prior neuro assessment is c/w today's exam  07/21/2024.     Assessment/Plan: 1. Functional deficits which require 3+ hours per day of interdisciplinary therapy in a comprehensive inpatient rehab setting. Physiatrist is providing close team supervision and 24 hour management of active medical problems listed below. Physiatrist and rehab team continue to assess barriers to discharge/monitor patient progress toward functional and medical goals  Care Tool:  Bathing    Body parts bathed by patient: Right arm, Chest, Abdomen, Front perineal area, Right upper leg, Buttocks, Left upper leg, Face   Body parts bathed by helper: Left arm, Right lower leg, Left lower leg     Bathing assist Assist Level: Moderate Assistance - Patient 50 - 74%     Upper Body Dressing/Undressing Upper body dressing   What is the patient wearing?: Pull over shirt    Upper body assist Assist Level: Moderate Assistance - Patient 50 - 74%    Lower Body Dressing/Undressing Lower body dressing      What is the patient wearing?: Pants, Underwear/pull up     Lower body assist Assist for lower body dressing: Maximal Assistance - Patient 25 - 49%     Toileting Toileting    Toileting assist Assist for toileting: Maximal Assistance - Patient 25 - 49%     Transfers Chair/bed transfer  Transfers assist     Chair/bed transfer assist level: Moderate Assistance - Patient 50 - 74%     Locomotion Ambulation   Ambulation assist      Assist level: Moderate Assistance - Patient 50 - 74% Assistive device: Walker-rolling Max distance: 11   Walk 10 feet activity   Assist     Assist level: Moderate Assistance - Patient - 50 - 74% Assistive device: Walker-rolling   Walk 50 feet activity   Assist Walk 50 feet with 2 turns activity did not occur: Safety/medical concerns         Walk 150 feet activity   Assist Walk 150 feet activity did not occur: Safety/medical concerns         Walk 10 feet on uneven surface   activity   Assist Walk 10 feet on uneven surfaces activity did not occur: Safety/medical concerns         Wheelchair     Assist Is the patient using a wheelchair?: Yes Type of Wheelchair: Manual    Wheelchair assist level: Moderate Assistance - Patient 50 - 74% Max wheelchair distance: 30    Wheelchair 50 feet with 2 turns activity    Assist        Assist Level: Maximal Assistance - Patient 25 - 49%   Wheelchair 150 feet activity     Assist      Assist Level: Dependent - Patient 0%   Blood pressure 130/78, pulse 95, temperature 98 F (36.7 C), temperature source Oral, resp. rate 18, height 5' 4 (1.626 m), weight 74 kg, SpO2 98%.  Medical Problem List and Plan: 1. 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  2.  Antithrombotics: -DVT/anticoagulation:  Pharmaceutical: Xarelto  x 30 days for DVT prophylaxis             -antiplatelet therapy: Aspirin  81 mg daily and Plavix  75 mg daily x 3 weeks then Plavix  alone-confirmed with IM team 3. Pain Management: Voltaren  gel 2 g 4 times daily, lidocaine  patch 4. Mood/Behavior/Sleep: Provide emotional support             -antipsychotic agents: N/A 5. Neuropsych/cognition: This patient is capable of making decisions on her own behalf. 6. Skin/Wound Care: Routine skin checks 7. Fluids/Electrolytes/Nutrition: Routine in and outs with follow-up chemistries 8.  Hypertension.  Norvasc  5 mg daily, Cozaar  25 mg daily.  Monitor with increased mobility. Avoid hypotension Vitals:   07/20/24 1934 07/21/24 0400  BP: 119/82 130/78  Pulse: 86 95  Resp: 17 18  Temp: 98.1 F (36.7 C) 98 F (36.7 C)  SpO2: 95% 98%   12/12 controlled, continue to monitor  9.  Diabetes mellitus.  Hemoglobin A1c 9.5.  Glucophage  500 mg daily, NovoLog  3 units 3 times daily, Lantus  insulin  14 units daily, Farxiga  10 mg daily CBG (last 3)  Recent Labs    07/20/24 2056 07/20/24 2207 07/21/24 0546  GLUCAP 348* 157* 122*  12/12 CBGs elevated yesterday, glucerna changed to ensure max, HS SSI was started. CBG stable this AM  10.  Hyperlipidemia.  Lipitor /Zetia  11.  Tobacco abuse as well as history of alcohol.  NicoDerm patch.  Alcohol negative on admission. 12.  Constipation. Increase miralax  to BID, continue senokot, start sorbitol PRN  -12/12 no abd tenderness, LBM 12/10, check abd xray 13.  L knee pain.  Improved with conservative treatment.  X-ray indicates effusion and OA.    LOS: 2 days A FACE TO FACE EVALUATION WAS PERFORMED  Murray Collier 07/21/2024, 9:44 AM

## 2024-07-21 NOTE — Progress Notes (Signed)
 Occupational Therapy Session Note  Patient Details  Name: Darlene Pugh MRN: [id number] Date of Birth: August 22, 1960  Today's Date: 07/21/2024 OT Individual Time: 1004-1046 OT Individual Time Calculation (min): 42 min    Short Term Goals: Week 1:  OT Short Term Goal 1 (Week 1): Pt will recall hemi-dressing techniques with MIN questioning cues OT Short Term Goal 2 (Week 1): Pt will complete toilet transfers with MIN A consistently using LRAD OT Short Term Goal 3 (Week 1): Pt will complete toileting with MIN A using LRAD OT Short Term Goal 4 (Week 1): Pt will utilize R UE at a gross assist level with SUP OT Short Term Goal 5 (Week 1): Pt will complete LB dressing with MOD A  Skilled Therapeutic Interventions/Progress Updates:  Pt greeted supine in bed, pt agreeable to OT intervention.      Transfers/bed mobility/functional mobility:  Pt completed supine>sit with use of bed features but CGA for safety.  Pt completed sit>stands with MINA, MIN cues for hand placement however pt reports, I need to do this my way.   Pt completed stand pivot transfers with RW with MINA, min cues for set- up, technique and hand placement on RW.   Therapeutic activity:  Pt completed seated peg board task to further assess RUE coordination, pt able to place/remove pegs with increased time and effort and slightly uncoordinated grasp. Pt reports feeling like the only difference in her R vs LUE is that her R is slower.   Pt completed standing balance task with pt instructed to stand with RW to then use RUE to transport velcro blocks from one side of the mirror to the other with a focus on improved RUE motor planning. Pt completed task with CGA but increased time and effort, pt seems to have difficulty with isometric control in RUE when UE is lifting against gravity.   Pt completed seated RUE reaching task with pt instructed to place/remove horseshoes from rim of basketball goal to challenge cylindrical  grasp and to improve RUE motor planning. Pt completed task with supervision but increased time and effort.   ADLs:  Grooming: pt requested to wash her face from bed level with set- up assist.   LB dressing: pt donned pants from EOB with MODA, pt able to thread pants but needed a little assistance to maneuver pants over gripper socks. Pt needed assist to pull pants up on R side in standing.                  Ended session with pt seated in w/c with all needs within reach and safety belt alarm activated.                    Therapy Documentation Precautions:  Precautions Precautions: Fall Recall of Precautions/Restrictions: Impaired Restrictions Weight Bearing Restrictions Per Provider Order: No  Pain: No pain    Therapy/Group: Individual Therapy  Ronal Mallie Needy 07/21/2024, 12:26 PM

## 2024-07-22 DIAGNOSIS — K5901 Slow transit constipation: Secondary | ICD-10-CM

## 2024-07-22 DIAGNOSIS — R3915 Urgency of urination: Secondary | ICD-10-CM | POA: Diagnosis not present

## 2024-07-22 DIAGNOSIS — R739 Hyperglycemia, unspecified: Secondary | ICD-10-CM

## 2024-07-22 LAB — GLUCOSE, CAPILLARY
Glucose-Capillary: 141 mg/dL — ABNORMAL HIGH (ref 70–99)
Glucose-Capillary: 133 mg/dL — ABNORMAL HIGH (ref 70–99)
Glucose-Capillary: 154 mg/dL — ABNORMAL HIGH (ref 70–99)
Glucose-Capillary: 109 mg/dL — ABNORMAL HIGH (ref 70–99)

## 2024-07-22 MED ORDER — MELATONIN 5 MG PO TABS
5.0000 mg | ORAL_TABLET | Freq: Every evening | ORAL | Status: DC | PRN
  Administered 2024-07-26: 22:00:00 5 mg via ORAL
  Filled 2024-07-22: qty 1

## 2024-07-22 MED ORDER — ALUM & MAG HYDROXIDE-SIMETH 200-200-20 MG/5ML PO SUSP
30.0000 mL | ORAL | Status: DC | PRN
  Administered 2024-07-27: 09:00:00 30 mL via ORAL
  Filled 2024-07-22: qty 30

## 2024-07-22 MED ORDER — DOCUSATE SODIUM 100 MG PO CAPS: 200.0000 mg | ORAL_CAPSULE | Freq: Every day | ORAL | Status: DC | PRN

## 2024-07-22 MED ORDER — PROCHLORPERAZINE MALEATE 5 MG PO TABS: 5.0000 mg | ORAL_TABLET | Freq: Four times a day (QID) | ORAL | Status: DC | PRN

## 2024-07-22 MED ORDER — GUAIFENESIN-DM 100-10 MG/5ML PO SYRP: 10.0000 mL | ORAL_SOLUTION | Freq: Four times a day (QID) | ORAL | Status: DC | PRN

## 2024-07-22 NOTE — IPOC Note (Signed)
 Overall Plan of Care [facility]) Patient Details Name: Darlene Pugh MRN: [id number] DOB: Aug 21, 1960  Admitting Diagnosis: Infarction of left basal ganglia [facility])  Hospital Problems: Principal Problem:   Infarction of left basal ganglia (HCC)     Functional Problem List: Nursing Edema, Endurance, Medication Management, Safety  PT Balance, Safety, Endurance, Motor  OT Balance, Perception, Cognition, Safety, Sensory, Endurance, Motor, Vision  SLP Linguistic, Nutrition, Cognition  TR         Basic ADLs: OT Bathing, Dressing, Grooming     Advanced  ADLs: OT Laundry     Transfers: PT Bed Mobility, Bed to Chair, Set Designer, Occupational Psychologist, Research Scientist (life Sciences): PT Ambulation, Psychologist, [employer], Stairs     Additional Impairments: OT Fuctional Use of Upper Extremity  SLP Swallowing, Social Cognition, Communication expression Problem Solving, Attention, Memory, Awareness  TR      Anticipated Outcomes Item Anticipated Outcome  Self Feeding MOD I  Swallowing  supervision   Basic self-care  SUP-CGA  Toileting  CGA   Bathroom Transfers CGA  Bowel/Bladder  remain continent of bowel and bladder  Transfers  supervision  Locomotion  CGA w/ LRAD  Communication  supervision  Cognition  minA  Pain  <4 w/ prns  Safety/Judgment  manage safety with min assistance   Therapy Plan: PT Intensity: Minimum of 1-2 x/day ,45 to 90 minutes PT Frequency: 5 out of 7 days PT Duration Estimated Length of Stay: 2-3 weeks OT Intensity: Minimum of 1-2 x/day, 45 to 90 minutes OT Frequency: 5 out of 7 days OT Duration/Estimated Length of Stay: 2-3 weeks SLP Intensity: Minumum of 1-2 x/day, 30 to 90 minutes SLP Frequency: 3 to 5 out of 7 days SLP Duration/Estimated Length of Stay: 2 weeks   Team Interventions: Nursing Interventions Patient/Family Education, Bladder Management, Bowel Management, Disease Management/Prevention, Pain Management, Medication Management,  Discharge Planning  PT interventions Ambulation/gait training, Community reintegration, Neuromuscular re-education, Stair training, UE/LE Strength taining/ROM, Wheelchair propulsion/positioning, UE/LE Coordination activities, Therapeutic Activities, Warden/ranger, Discharge planning, Functional mobility training, Patient/family education  OT Interventions Balance/vestibular training, Discharge planning, Pain management, Functional electrical stimulation, Self Care/advanced ADL retraining, Therapeutic Activities, UE/LE Coordination activities, Cognitive remediation/compensation, Disease mangement/prevention, Patient/family education, Functional mobility training, Skin care/wound managment, Therapeutic Exercise, Visual/perceptual remediation/compensation, Firefighter, Fish Farm Manager, Neuromuscular re-education, Psychosocial support, Splinting/orthotics, UE/LE Strength taining/ROM, Wheelchair propulsion/positioning  SLP Interventions Cognitive remediation/compensation, Financial trader, Dysphagia/aspiration precaution training, Functional tasks, Internal/external aids, Multimodal communication approach, Patient/family education, Speech/Language facilitation, Therapeutic Exercise  TR Interventions    SW/CM Interventions Discharge Planning, Psychosocial Support, Patient/Family Education   Barriers to Discharge MD  Medical stability  Nursing Decreased caregiver support, Home environment access/layout, Weight Discharge: Apartment  Discharge Home Layout: One level  Discharge Home Access: Stairs to enter  Entrance Stairs-Rails: Can reach both  Entrance Stairs-Number of Steps: 5  PT Inaccessible home environment    OT      SLP      SW Insurance for SNF coverage     Team Discharge Planning: Destination: PT-Home ,OT- Home , SLP-Home Projected Follow-up: PT-Home health PT, OT-  Outpatient OT, SLP-Home Health SLP, Outpatient SLP Projected Equipment Needs: PT-To  be determined, OT- To be determined, SLP-None recommended by SLP Equipment Details: PT- , OT-  Patient/family involved in discharge planning: PT- Patient, Family member/caregiver,  OT-Patient, SLP-Patient  MD ELOS: 2-3 weeks Medical Rehab Prognosis:  Excellent Assessment: The patient has been admitted for CIR therapies with the diagnosis of left internal capsule, basal  ganglia infarction related to small vessel disease . The team will be addressing functional mobility, strength, stamina, balance, safety, adaptive techniques and equipment, self-care, bowel and bladder mgt, patient and caregiver education. Goals have been set at sup. Anticipated discharge destination is home.        See Team Conference Notes for weekly updates to the plan of care

## 2024-07-22 NOTE — Progress Notes (Signed)
 PROGRESS NOTE   Subjective/Complaints:  Pt doing ok, slept ok, pain doing ok, LBM 2d ago per pt but last documented 12/10. States she doesn't feel constipated. Initially says she's urinating fine but then says she has urgency and pressure with urination like it comes out fast to start-- really difficult to elaborate. Denies abd pain per se, but states there's pressure. No other complaints or concerns.   ROS: negative chest pain shortness of breath abd pain nausea vomiting diarrhea or constipation. + abdominal discomfort-pressure +urinary urgency  Objective:   DG Abd 2 Views Result Date: 07/21/2024 EXAM: 2 VIEW XRAY OF THE ABDOMEN 07/21/2024 12:21:50 PM COMPARISON: None available. CLINICAL HISTORY: Abdominal pain FINDINGS: BOWEL: Nonobstructive bowel gas pattern. Contrast material extending from the hepatic flexure to the rectum. SOFT TISSUES: Cholecystectomy clips in right upper quadrant. BONES: No acute fracture. IMPRESSION: 1. No acute findings. Electronically signed by: Franky Stanford MD 07/21/2024 08:58 PM EST RP Workstation: [id number]    Recent Labs    07/20/24 0441  WBC 8.3  HGB 15.1*  HCT 44.5  PLT 378   Recent Labs    07/20/24 0441  NA 137  K 5.0  CL 99  CO2 25  GLUCOSE 126*  BUN 23  CREATININE 0.69  CALCIUM  9.2    Intake/Output Summary (Last 24 hours) at 07/22/2024 1320 Last data filed at 07/22/2024 0834 Gross per 24 hour  Intake 218 ml  Output --  Net 218 ml        Physical Exam: Vital Signs Blood pressure 123/86, pulse 97, temperature 98.4 F (36.9 C), temperature source Oral, resp. rate 18, height 5' 4 (1.626 m), weight 74 kg, SpO2 100%.  General: No acute distress, appears comfortable, sitting up in w/c working with PT Heart: Regular rate and rhythm no rubs murmurs or extra sounds Lungs: Clear to auscultation, breathing unlabored, no rales or wheezes Abdomen: hypoactive bowel sounds, soft,  nontender to palpation, nondistended but obese Extremities: No clubbing, cyanosis, or edema Skin: No evidence of breakdown, no evidence of rash over exposed surfaces Psych: flat  PRIOR EXAMS: Neurologic: Cranial nerves II through XII intact, motor strength is 5/5 in left and 3 -/5 right deltoid, bicep, tricep, grip, hip flexor, knee extensors, 5/5 left and 2 -/5 right ankle dorsiflexor and plantar flexor Sensory exam normal sensation to light touch in bilateral upper and lower extremities Cerebellar exam normal finger to nose to finger as well as heel to shin in left upper and lower extremities, right upper extremity testing limited by weakness Musculoskeletal: Full range of motion in all 4 extremities. No joint swelling, no pain with range of motion      Assessment/Plan: 1. Functional deficits which require 3+ hours per day of interdisciplinary therapy in a comprehensive inpatient rehab setting. Physiatrist is providing close team supervision and 24 hour management of active medical problems listed below. Physiatrist and rehab team continue to assess barriers to discharge/monitor patient progress toward functional and medical goals  Care Tool:  Bathing    Body parts bathed by patient: Right arm, Chest, Abdomen, Front perineal area, Right upper leg, Buttocks, Left upper leg, Face   Body parts bathed by helper: Left arm,  Right lower leg, Left lower leg     Bathing assist Assist Level: Moderate Assistance - Patient 50 - 74%     Upper Body Dressing/Undressing Upper body dressing   What is the patient wearing?: Pull over shirt    Upper body assist Assist Level: Moderate Assistance - Patient 50 - 74%    Lower Body Dressing/Undressing Lower body dressing      What is the patient wearing?: Pants     Lower body assist Assist for lower body dressing: Moderate Assistance - Patient 50 - 74%     Toileting Toileting    Toileting assist Assist for toileting: Maximal Assistance -  Patient 25 - 49%     Transfers Chair/bed transfer  Transfers assist     Chair/bed transfer assist level: Minimal Assistance - Patient > 75%     Locomotion Ambulation   Ambulation assist      Assist level: Moderate Assistance - Patient 50 - 74% Assistive device: Walker-rolling Max distance: 11   Walk 10 feet activity   Assist     Assist level: Moderate Assistance - Patient - 50 - 74% Assistive device: Walker-rolling   Walk 50 feet activity   Assist Walk 50 feet with 2 turns activity did not occur: Safety/medical concerns         Walk 150 feet activity   Assist Walk 150 feet activity did not occur: Safety/medical concerns         Walk 10 feet on uneven surface  activity   Assist Walk 10 feet on uneven surfaces activity did not occur: Safety/medical concerns         Wheelchair     Assist Is the patient using a wheelchair?: Yes Type of Wheelchair: Manual    Wheelchair assist level: Moderate Assistance - Patient 50 - 74% Max wheelchair distance: 30    Wheelchair 50 feet with 2 turns activity    Assist        Assist Level: Maximal Assistance - Patient 25 - 49%   Wheelchair 150 feet activity     Assist      Assist Level: Total Assistance - Patient < 25%   Blood pressure 123/86, pulse 97, temperature 98.4 F (36.9 C), temperature source Oral, resp. rate 18, height 5' 4 (1.626 m), weight 74 kg, SpO2 100%.  Medical Problem List and Plan: 1. 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  2.  Antithrombotics: -DVT/anticoagulation:  Pharmaceutical: Xarelto  10mg  daily x 30 days for DVT prophylaxis -antiplatelet therapy:  Aspirin  81 mg daily and Plavix  75 mg daily x 3 weeks then Plavix  alone-confirmed with IM team  3. Pain Management: Voltaren  gel 2 g 4 times daily, lidocaine  patches, tylenol  PRN  4. Mood/Behavior/Sleep: Provide emotional support             -antipsychotic agents: N/A  5. Neuropsych/cognition: This patient is capable of making decisions on her own behalf.  6. Skin/Wound Care: Routine skin checks  7. Fluids/Electrolytes/Nutrition: Routine in and outs with follow-up chemistries, continue vitamins/supplements. Pepcid  40mg  daily.  8.  Hypertension.  Norvasc  5 mg daily, Cozaar  25 mg daily.  Monitor with increased mobility. Avoid hypotension  -12/12-13 controlled, continue to monitor  Vitals:   07/19/24 1406 07/19/24 2033 07/20/24 0248 07/20/24 1452  BP: 94/76 111/81 112/79 101/76   07/20/24 1934 07/21/24 0400 07/21/24 1338 07/21/24 1930  BP: 119/82 130/78 108/76 100/67   07/22/24 0430 07/22/24 1258  BP: 121/84 123/86    9.  Diabetes mellitus.  Hemoglobin A1c 9.5.  Glucophage  XR 500 mg daily, NovoLog  3 units 3 times daily, Lantus  insulin  14 units daily, Farxiga  10 mg daily -12/12 CBGs elevated yesterday, glucerna changed to ensure max, HS SSI was started. CBG stable this AM -07/22/24 CBGs decent, cont regimen CBG (last 3)  Recent Labs    07/21/24 2152 07/22/24 0627 07/22/24 1148  GLUCAP 142* 141* 133*    10.  Hyperlipidemia.  Lipitor  80mg  daily, Zetia  10mg  daily  11.  Tobacco abuse as well as history of alcohol.  NicoDerm patch daily-21mg  dose.  Alcohol negative on admission.  12.  Constipation. Increase miralax  to BID, continue senokot 1 tab BID, start sorbitol  PRN  -12/12 no abd tenderness, LBM 12/10, check abd xray -07/22/24 xray neg, no BM since 12/10 documented but pt states only 2d, added colace PRN but if no BM tomorrow, might need to use sorbitol /PRNs.   13.  L knee pain.  Improved with conservative treatment.  X-ray indicates effusion and OA.  14. Urinary  urgency/pressure: -07/22/24 c/o pressure and urgency, had retention and UTI at admission to hospital, treated with 3d of Ceftriaxone  per notes. States she is having continued urgency/pressure but can't really elaborate and also not sure when it started  -obtain U/A, might consider Cx if suspicious appearing    LOS: 3 days A FACE TO FACE EVALUATION WAS PERFORMED  [address] 07/22/2024, 1:20 PM

## 2024-07-22 NOTE — Progress Notes (Signed)
 Physical Therapy Session Note  Patient Details  Name: Darlene Pugh MRN: [id number] Date of Birth: [birth year]  Today's Date: 07/22/2024 PT Individual Time: 9080-8956 PT Individual Time Calculation (min): 84 min   Short Term Goals: Week 1:  PT Short Term Goal 1 (Week 1): Pt will transfer sup to sit w/ CGA consistently w/o bed features. PT Short Term Goal 2 (Week 1): Pt will transfer sit to stand w/ min A consistently. PT Short Term Goal 3 (Week 1): Pt will amb x 25' w/ RW and min A PT Short Term Goal 4 (Week 1): PT to assess stairs.  Skilled Therapeutic Interventions/Progress Updates:  Patient seated upright in w/c on entrance to room. Has been up with OT already this morning and is dressed with hair fixed. Patient alert and open to PT session. Though continues to roll eyes and demonstrate apathy throughout session.   Patient with no pain complaint at start of session.  Therapeutic Activity: Transfers: Pt performed sit<>stand transfers  throughout session with Min/ ModA to no AD and reaching out to anything nearby. With RW, performs welll with split UE positioning. Completes with CGA/ MinA. Provided vc/ tc for sequencing of technique.  Pt relates having wet brief and so transported into bathroom to use safety rail to stand and then provided pericare, changed brief with MaxA. Pt assists with managing doffing wet pants and donning clean pants. Requires ModA to complete.   Gait Training: Pt guided in ambulation with HHA to L side and reaching 30 ft with step-to gait pattern leading with LLE. With fatigue RLE noted to slide forward in advancement. Decreased strength with fatigue in order to lift from floor. Pt complains throughout re: decreased strength and difficulty with walking, however continues to advance forward.   Neuromuscular Re-ed: NMR facilitated during session with focus on standing balance, motor control/ persistence. Pt guided in placement of horseshoes from R side  table to basketball hoop at head level using RUE. No RW used initially and pt very fearful of movement. With effort is able to place each horseshoe on rim with RUE and MinA only for balance. Mild lean to R with fatigue. After seated rest break, completes the horse shoes to rim. Extra time and focus to get RUE to rim. Increasing fatigue with resultant R lean and pt relating being too tired and relates she is about to lose her balance. However, pt just leaning to R and is able to return to seated position in w/c with MinA for positioning.  To remove, pt provided with RW and able to remove horseshoes from rim with alternating UE in one standing bout. Relates fatigue.   NMR performed for improvements in motor control and coordination, balance, sequencing, judgement, and self confidence/ efficacy in performing all aspects of mobility at highest level of independence.   Patient seated upright at end of session with brakes locked, belt alarm set, and all needs within reach.   Therapy Documentation Precautions:  Precautions Precautions: Fall Recall of Precautions/Restrictions: Impaired Restrictions Weight Bearing Restrictions Per Provider Order: No  Pain:  No pain related this session.    Therapy/Group: Individual Therapy  [name] PT, DPT, CSRS 07/22/2024, 2:02 PM

## 2024-07-22 NOTE — Progress Notes (Signed)
 Several attempts to catch urine clean catch, patient missed the hat. Will notify oncoming nurse of order as well.  Geni Armor, LPN

## 2024-07-22 NOTE — Progress Notes (Signed)
 Physical Therapy Session Note  Patient Details  Name: Darlene Pugh MRN: [id number] Date of Birth: 23-Dec-1960  Today's Date: 07/22/2024 PT Individual Time: 1440-1515 PT Individual Time Calculation (min): 35 min   Short Term Goals: Week 1:  PT Short Term Goal 1 (Week 1): Pt will transfer sup to sit w/ CGA consistently w/o bed features. PT Short Term Goal 2 (Week 1): Pt will transfer sit to stand w/ min A consistently. PT Short Term Goal 3 (Week 1): Pt will amb x 25' w/ RW and min A PT Short Term Goal 4 (Week 1): PT to assess stairs.  Skilled Therapeutic Interventions/Progress Updates: Patient semi-reclined in bed on entrance to room. Patient alert and agreeable to PT session.   Patient reported no pain. Pt reported fatigue but willing to try what physically able.   Therapeutic Activity: Bed Mobility: Pt performed supine<>sit on EOB with supervision (HOB slightly elevated).  Transfers: Pt performed sit<>stand transfer with CGA/minA and cue to increase R LE length to pivot step from EOB<WC. Pt performed  4 from WC<>mat with closer to modA (pt fatigue as this was done following stairs). Pt required weight shift to L to improve R LE retro step but ultimately pt struggle to produce adequate clearance to perform safely.   - Pt navigated 4 (6) steps with LHR and overall modA ascending and closer to maxA descending. Pt cued to ascend with L, and descend with R. Pt maxA to descend to avoid anterior and posterior LOB. Pt required increased effort to hip flex R LE to step down to next step and to safely plant. Throughout, pt required manual facilitation of weight shift accordingly.  - Pt performed step to 4 step with R LE in preparation for functional carry over to navigate steps. Pt required overall min/modA and cues to increase R hip flexion when stepping off step. Pt also required manual facilitation of weight shift.   Patient sitting in WC at end of session with brakes locked, family  present, belt alarm set, and all needs within reach.      Therapy Documentation Precautions:  Precautions Precautions: Fall Recall of Precautions/Restrictions: Impaired Restrictions Weight Bearing Restrictions Per Provider Order: No   Therapy/Group: Individual Therapy  Nellie Chevalier PTA 07/22/2024, 3:19 PM

## 2024-07-22 NOTE — Plan of Care (Signed)

## 2024-07-22 NOTE — Progress Notes (Signed)
 Occupational Therapy Session Note  Patient Details  Name: Darlene Pugh MRN: [id number] Date of Birth: 16-Jan-1961  Today's Date: 07/22/2024 OT Individual Time: 9269-9169 OT Individual Time Calculation (min): 60 min    Short Term Goals: Week 1:  OT Short Term Goal 1 (Week 1): Pt will recall hemi-dressing techniques with MIN questioning cues OT Short Term Goal 2 (Week 1): Pt will complete toilet transfers with MIN A consistently using LRAD OT Short Term Goal 3 (Week 1): Pt will complete toileting with MIN A using LRAD OT Short Term Goal 4 (Week 1): Pt will utilize R UE at a gross assist level with SUP OT Short Term Goal 5 (Week 1): Pt will complete LB dressing with MOD A  Skilled Therapeutic Interventions/Progress Updates:   Patient agreeable to participate in OT session. Reports 4/10 pain level. Patient participated in skilled OT session focusing on eating, dressing, sink level bathing. Patient received in bed slowly awakening. OT suggested patient complete eating, completed at EOB with SUP due to swallowing precautions. Patient able to complete eating task utilizing primarily L hand with R hand as occasional stabilizer/ gross assist level to complete eating task. OT utilized verbal cues to increase use of RUE. Patient then completed UB dressing sitting on EOB with SU assist. Patient then completed LB dressing with CGA from EOB with CGA for standing from bed level. Transfer to chair mod A with mod verbal cues due to decreased LLE coordination. Patient completed grooming at sink with SUP assist. Patient able to open toothpaste, place it on toothbrush and brush teeth with good skill utilizing both hands. Returned to bed following oral hygiene all needs in reach alarm on.    Therapy Documentation Precautions:  Precautions Precautions: Fall Recall of Precautions/Restrictions: Impaired Restrictions Weight Bearing Restrictions Per Provider Order: No  Therapy/Group: Individual  Therapy  D'mariea L Triston Lisanti 07/22/2024, 7:20 AM

## 2024-07-23 DIAGNOSIS — R739 Hyperglycemia, unspecified: Secondary | ICD-10-CM | POA: Diagnosis not present

## 2024-07-23 DIAGNOSIS — K5901 Slow transit constipation: Secondary | ICD-10-CM | POA: Diagnosis not present

## 2024-07-23 DIAGNOSIS — I10 Essential (primary) hypertension: Secondary | ICD-10-CM | POA: Diagnosis not present

## 2024-07-23 LAB — URINALYSIS, ROUTINE W REFLEX MICROSCOPIC
Bacteria, UA: NONE SEEN
Bilirubin Urine: NEGATIVE
Glucose, UA: >=500 mg/dL — AB
Hgb urine dipstick: NEGATIVE
Ketones, ur: NEGATIVE mg/dL
Nitrite: NEGATIVE
Protein, ur: 30 mg/dL — AB
Specific Gravity, Urine: 1.022 (ref 1.005–1.030)
WBC, UA: >50 WBC/hpf (ref 0–5)
pH: 6 (ref 5.0–8.0)

## 2024-07-23 LAB — GLUCOSE, CAPILLARY
Glucose-Capillary: 107 mg/dL — ABNORMAL HIGH (ref 70–99)
Glucose-Capillary: 102 mg/dL — ABNORMAL HIGH (ref 70–99)
Glucose-Capillary: 128 mg/dL — ABNORMAL HIGH (ref 70–99)
Glucose-Capillary: 115 mg/dL — ABNORMAL HIGH (ref 70–99)

## 2024-07-23 MED ORDER — CEPHALEXIN 250 MG PO CAPS
500.0000 mg | ORAL_CAPSULE | Freq: Three times a day (TID) | ORAL | Status: DC
  Administered 2024-07-23 – 2024-07-24 (×4): 500 mg via ORAL
  Filled 2024-07-23 (×4): qty 2

## 2024-07-23 NOTE — Progress Notes (Signed)
 PROGRESS NOTE   Subjective/Complaints:  Pt doing well today, slept ok, pain doing ok, LBM yesterday. Not complaining today of any urinary issues, says she's urinating ok. No other complaints or concerns.   ROS: negative chest pain shortness of breath abd pain nausea vomiting diarrhea or constipation. + abdominal discomfort-pressure +urinary urgency- not mentioned today  Objective:   DG Abd 2 Views Result Date: 07/21/2024 EXAM: 2 VIEW XRAY OF THE ABDOMEN 07/21/2024 12:21:50 PM COMPARISON: None available. CLINICAL HISTORY: Abdominal pain FINDINGS: BOWEL: Nonobstructive bowel gas pattern. Contrast material extending from the hepatic flexure to the rectum. SOFT TISSUES: Cholecystectomy clips in right upper quadrant. BONES: No acute fracture. IMPRESSION: 1. No acute findings. Electronically signed by: Franky Stanford MD 07/21/2024 08:58 PM EST RP Workstation: [id number]    No results for input(s): WBC, HGB, HCT, PLT in the last 72 hours.  No results for input(s): NA, K, CL, CO2, GLUCOSE, BUN, CREATININE, CALCIUM  in the last 72 hours.   Intake/Output Summary (Last 24 hours) at 07/23/2024 1207 Last data filed at 07/23/2024 0800 Gross per 24 hour  Intake 716 ml  Output --  Net 716 ml        Physical Exam: Vital Signs Blood pressure 127/83, pulse 93, temperature 98.8 F (37.1 C), temperature source Oral, resp. rate 19, height 5' 4 (1.626 m), weight 74 kg, SpO2 98%.  General: No acute distress, appears comfortable, sitting up in bed Heart: Regular rate and rhythm no rubs murmurs or extra sounds Lungs: Clear to auscultation, breathing unlabored, no rales or wheezes Abdomen: hypoactive bowel sounds, soft, nontender to palpation, nondistended but obese Extremities: No clubbing, cyanosis, or edema Skin: No evidence of breakdown, no evidence of rash over exposed surfaces Psych: flat  PRIOR  EXAMS: Neurologic: Cranial nerves II through XII intact, motor strength is 5/5 in left and 3 -/5 right deltoid, bicep, tricep, grip, hip flexor, knee extensors, 5/5 left and 2 -/5 right ankle dorsiflexor and plantar flexor Sensory exam normal sensation to light touch in bilateral upper and lower extremities Cerebellar exam normal finger to nose to finger as well as heel to shin in left upper and lower extremities, right upper extremity testing limited by weakness Musculoskeletal: Full range of motion in all 4 extremities. No joint swelling, no pain with range of motion      Assessment/Plan: 1. Functional deficits which require 3+ hours per day of interdisciplinary therapy in a comprehensive inpatient rehab setting. Physiatrist is providing close team supervision and 24 hour management of active medical problems listed below. Physiatrist and rehab team continue to assess barriers to discharge/monitor patient progress toward functional and medical goals  Care Tool:  Bathing    Body parts bathed by patient: Right arm, Chest, Abdomen, Front perineal area, Right upper leg, Buttocks, Left upper leg, Face   Body parts bathed by helper: Left arm, Right lower leg, Left lower leg     Bathing assist Assist Level: Moderate Assistance - Patient 50 - 74%     Upper Body Dressing/Undressing Upper body dressing   What is the patient wearing?: Pull over shirt    Upper body assist Assist Level: Moderate Assistance - Patient 50 - 74%  Lower Body Dressing/Undressing Lower body dressing      What is the patient wearing?: Pants     Lower body assist Assist for lower body dressing: Moderate Assistance - Patient 50 - 74%     Toileting Toileting    Toileting assist Assist for toileting: Maximal Assistance - Patient 25 - 49%     Transfers Chair/bed transfer  Transfers assist     Chair/bed transfer assist level: Minimal Assistance - Patient > 75%      Locomotion Ambulation   Ambulation assist      Assist level: Moderate Assistance - Patient 50 - 74% Assistive device: Walker-rolling Max distance: 11   Walk 10 feet activity   Assist     Assist level: Moderate Assistance - Patient - 50 - 74% Assistive device: Walker-rolling   Walk 50 feet activity   Assist Walk 50 feet with 2 turns activity did not occur: Safety/medical concerns         Walk 150 feet activity   Assist Walk 150 feet activity did not occur: Safety/medical concerns         Walk 10 feet on uneven surface  activity   Assist Walk 10 feet on uneven surfaces activity did not occur: Safety/medical concerns         Wheelchair     Assist Is the patient using a wheelchair?: Yes Type of Wheelchair: Manual    Wheelchair assist level: Moderate Assistance - Patient 50 - 74% Max wheelchair distance: 30    Wheelchair 50 feet with 2 turns activity    Assist        Assist Level: Maximal Assistance - Patient 25 - 49%   Wheelchair 150 feet activity     Assist      Assist Level: Total Assistance - Patient < 25%   Blood pressure 127/83, pulse 93, temperature 98.8 F (37.1 C), temperature source Oral, resp. rate 19, height 5' 4 (1.626 m), weight 74 kg, SpO2 98%.  Medical Problem List and Plan: 1. 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  2.  Antithrombotics: -DVT/anticoagulation:  Pharmaceutical: Xarelto  10mg  daily x 30 days for DVT prophylaxis -antiplatelet therapy: Aspirin  81 mg daily and Plavix  75 mg daily x 3 weeks then Plavix  alone-confirmed with IM team  3. Pain Management: Voltaren  gel 2 g 4 times daily,  lidocaine  patches, tylenol  PRN  4. Mood/Behavior/Sleep: Provide emotional support             -antipsychotic agents: N/A  5. Neuropsych/cognition: This patient is capable of making decisions on her own behalf.  6. Skin/Wound Care: Routine skin checks  7. Fluids/Electrolytes/Nutrition: Routine in and outs with follow-up chemistries, continue vitamins/supplements. Pepcid  40mg  daily.  8.  Hypertension.  Norvasc  5 mg daily, Cozaar  25 mg daily.  Monitor with increased mobility. Avoid hypotension  -12/12-14 controlled, continue to monitor  Vitals:   07/19/24 1406 07/19/24 2033 07/20/24 0248 07/20/24 1452  BP: 94/76 111/81 112/79 101/76   07/20/24 1934 07/21/24 0400 07/21/24 1338 07/21/24 1930  BP: 119/82 130/78 108/76 100/67  07/22/24 0430 07/22/24 1258 07/22/24 2218 07/23/24 0510  BP: 121/84 123/86 110/84 127/83    9.  Diabetes mellitus.  Hemoglobin A1c 9.5.  Glucophage  XR 500 mg daily, NovoLog  3 units 3 times daily, Lantus  insulin  14 units daily, Farxiga  10 mg daily -12/12 CBGs elevated yesterday, glucerna changed to ensure max, HS SSI was started. CBG stable this AM -12/13-14/25 CBGs decent, cont regimen CBG (last 3)  Recent Labs    07/22/24 2215 07/23/24 0616 07/23/24 1131  GLUCAP 109* 107* 102*    10.  Hyperlipidemia.  Lipitor  80mg  daily, Zetia  10mg  daily  11.  Tobacco abuse as well as history of alcohol.  NicoDerm patch daily-21mg  dose.  Alcohol negative on admission.  12.  Constipation. Increase miralax  to BID, continue senokot 1 tab BID, start sorbitol  PRN  -12/12 no abd tenderness, LBM 12/10, check abd xray -07/22/24 xray neg, no BM since 12/10 documented but pt states only 2d, added colace PRN but if no BM tomorrow, might need to use sorbitol /PRNs.  -07/23/24 LBM yesterday, cont regimen  13.  L knee pain.  Improved with conservative treatment.  X-ray indicates effusion and OA.  14. Urinary urgency/pressure: -07/22/24 c/o pressure and urgency, had retention and UTI at  admission to hospital, treated with 3d of Ceftriaxone  per notes. States she is having continued urgency/pressure but can't really elaborate and also not sure when it started  -obtain U/A, might consider Cx if suspicious appearing -07/23/24 U/A with trace leuks, >50 WBCs, no bacteria seen but given her symptoms yesterday, will proceed with Keflex  TID x5d. Will get UCx running too.     LOS: 4 days A FACE TO FACE EVALUATION WAS PERFORMED  [address] 07/23/2024, 12:07 PM

## 2024-07-23 NOTE — Plan of Care (Signed)
°  Problem: Consults Goal: RH STROKE PATIENT EDUCATION Description: See Patient Education module for education specifics  Outcome: Progressing   Problem: RH BOWEL ELIMINATION Goal: RH STG MANAGE BOWEL WITH ASSISTANCE Description: STG Manage Bowel with min Assistance. Outcome: Progressing   Problem: RH BLADDER ELIMINATION Goal: RH STG MANAGE BLADDER WITH ASSISTANCE Description: STG Manage Bladder With min Assistance Outcome: Progressing   Problem: RH SAFETY Goal: RH STG ADHERE TO SAFETY PRECAUTIONS W/ASSISTANCE/DEVICE Description: STG Adhere to Safety Precautions With min Assistance/Device. Outcome: Progressing   Problem: RH PAIN MANAGEMENT Goal: RH STG PAIN MANAGED AT OR BELOW PT'S PAIN GOAL Description: <4 w/ prns Outcome: Progressing   Problem: Education: Goal: Ability to describe self-care measures that may prevent or decrease complications (Diabetes Survival Skills Education) will improve Outcome: Progressing Goal: Individualized Educational Video(s) Outcome: Progressing   Problem: Coping: Goal: Ability to adjust to condition or change in health will improve Outcome: Progressing   Problem: Fluid Volume: Goal: Ability to maintain a balanced intake and output will improve Outcome: Progressing   Problem: Health Behavior/Discharge Planning: Goal: Ability to identify and utilize available resources and services will improve Outcome: Progressing Goal: Ability to manage health-related needs will improve Outcome: Progressing   Problem: Metabolic: Goal: Ability to maintain appropriate glucose levels will improve Outcome: Progressing   Problem: Nutritional: Goal: Maintenance of adequate nutrition will improve Outcome: Progressing Goal: Progress toward achieving an optimal weight will improve Outcome: Progressing   Problem: Skin Integrity: Goal: Risk for impaired skin integrity will decrease Outcome: Progressing   Problem: Tissue Perfusion: Goal: Adequacy of tissue  perfusion will improve Outcome: Progressing

## 2024-07-23 NOTE — Progress Notes (Addendum)
 Awaiting urine culture results.   Geni Armor, LPN

## 2024-07-24 DIAGNOSIS — I69351 Hemiplegia and hemiparesis following cerebral infarction affecting right dominant side: Secondary | ICD-10-CM | POA: Diagnosis not present

## 2024-07-24 DIAGNOSIS — I639 Cerebral infarction, unspecified: Secondary | ICD-10-CM | POA: Diagnosis not present

## 2024-07-24 LAB — GLUCOSE, CAPILLARY
Glucose-Capillary: 118 mg/dL — ABNORMAL HIGH (ref 70–99)
Glucose-Capillary: 111 mg/dL — ABNORMAL HIGH (ref 70–99)
Glucose-Capillary: 135 mg/dL — ABNORMAL HIGH (ref 70–99)
Glucose-Capillary: 87 mg/dL (ref 70–99)

## 2024-07-24 LAB — URINE CULTURE

## 2024-07-24 MED ORDER — CEPHALEXIN 250 MG PO CAPS
500.0000 mg | ORAL_CAPSULE | Freq: Two times a day (BID) | ORAL | Status: AC
  Administered 2024-07-24 – 2024-07-27 (×6): 500 mg via ORAL
  Filled 2024-07-24 (×6): qty 2

## 2024-07-24 NOTE — Progress Notes (Signed)
 Speech Language Pathology Daily Session Note  Patient Details  Name: Darlene Pugh MRN: [id number] Date of Birth: 1961-05-02  Today's Date: 07/24/2024 SLP Individual Time: 8896-8841 SLP Individual Time Calculation (min): 55 min  Short Term Goals: Week 1: SLP Short Term Goal 1 (Week 1): Patient will utilize swallowing compensatory strategies during consumption of least restrictive diet given min verbal A SLP Short Term Goal 2 (Week 1): Patient will demonstrate orientation to situation given min verbal A SLP Short Term Goal 3 (Week 1): Patient will name functional items with 100% accuracy given min multimodal A SLP Short Term Goal 4 (Week 1): Patient will recall daily events given mod multimodal A SLP Short Term Goal 5 (Week 1): Patient will demonstrate functional problem solving skills during daily tasks given mod multimodal A  Skilled Therapeutic Interventions:  Patient was seen in am to address dysphagia management and speech intelligibility. Pt initially appeared reluctant to participate and reported speech was not concern. In spite of information, she was agreeable for session. SLP guided pt in lingual strengthening through use of IOPI. She completed 20 reps respectively of anterior strengthening at 26kPa and posteriorly at 21kPa given sup A. SLP introduced and educated pt in 'BE FAST' stroke symptoms. Later in session pt recalled 2 units with mod A. In other minutes of session, SLP engaged pt in word finding and speech intelligibility. In a structured task, pt completed a generative naming task with ~83% acc and min A. At conclusion of session, pt expressed some frustration and feelings of being overwhelmed as it relates to deficits post CVA. SLP provided active listening and encouragement. Pt appeared grateful for talk. Pt was left upright in bed with call button within reach and bed alarm active. SLP to continue POC.   Pain Pain Assessment Pain Scale: 0-10 Pain Score: 0-No  pain  Therapy/Group: Individual Therapy  [name] 07/24/2024, 11:57 AM

## 2024-07-24 NOTE — Progress Notes (Addendum)
 PROGRESS NOTE   Subjective/Complaints:  Pt states she is taking too many meds  reviewed med list , has been on same meds other than Keflex  added for UTI  We discussed that BP and CBG currently in optimal range and that current meds are working well   ROS: negative chest pain shortness of breath abd pain nausea vomiting diarrhea or constipation.   Objective:   No results found.   No results for input(s): WBC, HGB, HCT, PLT in the last 72 hours.  No results for input(s): NA, K, CL, CO2, GLUCOSE, BUN, CREATININE, CALCIUM  in the last 72 hours.   Intake/Output Summary (Last 24 hours) at 07/24/2024 0917 Last data filed at 07/24/2024 0746 Gross per 24 hour  Intake 830 ml  Output 300 ml  Net 530 ml        Physical Exam: Vital Signs Blood pressure 106/67, pulse 84, temperature 97.7 F (36.5 C), temperature source Oral, resp. rate 18, height 5' 4 (1.626 m), weight 74 kg, SpO2 98%.  General: No acute distress, appears comfortable, sitting up in bed Heart: Regular rate and rhythm no rubs murmurs or extra sounds Lungs: Clear to auscultation, breathing unlabored, no rales or wheezes Abdomen: hypoactive bowel sounds, soft, nontender to palpation, nondistended but obese Extremities: No clubbing, cyanosis, or edema Skin: No evidence of breakdown, no evidence of rash over exposed surfaces Psych: flat  PRIOR EXAMS: Neurologic: Cranial nerves II through XII intact, motor strength is 5/5 in left and 3 -/5 right deltoid, bicep, tricep, grip, hip flexor, knee extensors, 5/5 left and 2 -/5 right ankle dorsiflexor and plantar flexor Sensory exam normal sensation to light touch in bilateral upper and lower extremities Cerebellar exam normal finger to nose to finger as well as heel to shin in left upper and lower extremities, right upper extremity testing limited by weakness Musculoskeletal: Full range of motion  in all 4 extremities. No joint swelling, no pain with range of motion      Assessment/Plan: 1. Functional deficits which require 3+ hours per day of interdisciplinary therapy in a comprehensive inpatient rehab setting. Physiatrist is providing close team supervision and 24 hour management of active medical problems listed below. Physiatrist and rehab team continue to assess barriers to discharge/monitor patient progress toward functional and medical goals  Care Tool:  Bathing    Body parts bathed by patient: Right arm, Chest, Abdomen, Front perineal area, Right upper leg, Buttocks, Left upper leg, Face   Body parts bathed by helper: Left arm, Right lower leg, Left lower leg     Bathing assist Assist Level: Moderate Assistance - Patient 50 - 74%     Upper Body Dressing/Undressing Upper body dressing   What is the patient wearing?: Pull over shirt    Upper body assist Assist Level: Moderate Assistance - Patient 50 - 74%    Lower Body Dressing/Undressing Lower body dressing      What is the patient wearing?: Pants     Lower body assist Assist for lower body dressing: Moderate Assistance - Patient 50 - 74%     Toileting Toileting    Toileting assist Assist for toileting: Maximal Assistance - Patient 25 - 49%  Transfers Chair/bed transfer  Transfers assist     Chair/bed transfer assist level: Minimal Assistance - Patient > 75%     Locomotion Ambulation   Ambulation assist      Assist level: Moderate Assistance - Patient 50 - 74% Assistive device: Walker-rolling Max distance: 11   Walk 10 feet activity   Assist     Assist level: Moderate Assistance - Patient - 50 - 74% Assistive device: Walker-rolling   Walk 50 feet activity   Assist Walk 50 feet with 2 turns activity did not occur: Safety/medical concerns         Walk 150 feet activity   Assist Walk 150 feet activity did not occur: Safety/medical concerns         Walk 10 feet  on uneven surface  activity   Assist Walk 10 feet on uneven surfaces activity did not occur: Safety/medical concerns         Wheelchair     Assist Is the patient using a wheelchair?: Yes Type of Wheelchair: Manual    Wheelchair assist level: Moderate Assistance - Patient 50 - 74% Max wheelchair distance: 30    Wheelchair 50 feet with 2 turns activity    Assist        Assist Level: Maximal Assistance - Patient 25 - 49%   Wheelchair 150 feet activity     Assist      Assist Level: Total Assistance - Patient < 25%   Blood pressure 106/67, pulse 84, temperature 97.7 F (36.5 C), temperature source Oral, resp. rate 18, height 5' 4 (1.626 m), weight 74 kg, SpO2 98%.  Medical Problem List and Plan: 1. 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  2.  Antithrombotics: -DVT/anticoagulation:  Pharmaceutical: Xarelto  10mg  daily x 30 days for DVT prophylaxis -antiplatelet therapy: Aspirin  81 mg daily and Plavix  75 mg daily x 3 weeks then Plavix  alone-confirmed with IM team  3. Pain Management: Voltaren  gel 2 g 4 times daily, lidocaine  patches, tylenol  PRN  4. Mood/Behavior/Sleep: Provide emotional support             -antipsychotic agents: N/A  5. Neuropsych/cognition: This patient is capable of making decisions on her own behalf.  6. Skin/Wound Care: Routine skin checks  7. Fluids/Electrolytes/Nutrition: Routine in and outs with follow-up chemistries, continue vitamins/supplements. Pepcid  40mg  daily.  8.  Hypertension.  Norvasc  5 mg daily, Cozaar  25 mg daily.  Monitor with increased mobility. Avoid hypotension  -12/12-14 controlled, continue to monitor   Vitals:   07/20/24 1452 07/20/24 1934 07/21/24 0400 07/21/24 1338  BP: 101/76 119/82 130/78 108/76   07/21/24 1930 07/22/24 0430 07/22/24 1258 07/22/24 2218  BP: 100/67 121/84 123/86 110/84   07/23/24 0510 07/23/24 1333 07/23/24 2033 07/24/24 0437  BP: 127/83 101/73 126/78 106/67    9.  Diabetes mellitus.  Hemoglobin A1c 9.5.  Glucophage  XR 500 mg daily, NovoLog  3 units 3 times daily, Lantus  insulin  14 units daily, Farxiga  10 mg daily -12/12 CBGs elevated yesterday, glucerna changed to ensure max, HS SSI was started. CBG stable  this AM -12/13-14/25 CBGs decent, cont regimen CBG (last 3)  Recent Labs    07/23/24 1624 07/23/24 2143 07/24/24 0625  GLUCAP 128* 115* 118*    10.  Hyperlipidemia.  Lipitor  80mg  daily, Zetia  10mg  daily  11.  Tobacco abuse as well as history of alcohol.  NicoDerm patch daily-21mg  dose.  Alcohol negative on admission.  12.  Constipation. Increase miralax  to BID, continue senokot 1 tab BID, start sorbitol  PRN  -12/12 no abd tenderness, LBM 12/10, check abd xray -07/22/24 xray neg, no BM since 12/10 documented but pt states only 2d, added colace PRN but if no BM tomorrow, might need to use sorbitol /PRNs.  -07/23/24 LBM yesterday, cont regimen  13.  L knee pain.  Improved with conservative treatment.  X-ray indicates effusion and OA.  14. Urinary urgency/pressure: -07/22/24 c/o pressure and urgency, had retention and UTI at admission to hospital, treated with 3d of Ceftriaxone  per notes. States she is having continued urgency/pressure but can't really elaborate and also not sure when it started  -obtain U/A, might consider Cx if suspicious appearing -07/23/24 U/A with trace leuks, >50 WBCs, no bacteria seen but given her symptoms yesterday, will proceed with Keflex  TID x5d. UCx pnd Reduce keflex  to BID      LOS: 5 days A FACE TO FACE EVALUATION WAS PERFORMED  Darlene Pugh 07/24/2024, 9:17 AM

## 2024-07-24 NOTE — Progress Notes (Signed)
 Occupational Therapy Session Note  Patient Details  Name: Darlene Pugh MRN: [id number] Date of Birth: 04-25-1961  Today's Date: 07/24/2024 OT Individual Time: 9053-8955 OT Individual Time Calculation (min): 58 min   Today's Date: 07/24/2024 OT Individual Time: 8652-8555 OT Individual Time Calculation (min): 57 min   Short Term Goals: Week 1:  OT Short Term Goal 1 (Week 1): Pt will recall hemi-dressing techniques with MIN questioning cues OT Short Term Goal 2 (Week 1): Pt will complete toilet transfers with MIN A consistently using LRAD OT Short Term Goal 3 (Week 1): Pt will complete toileting with MIN A using LRAD OT Short Term Goal 4 (Week 1): Pt will utilize R UE at a gross assist level with SUP OT Short Term Goal 5 (Week 1): Pt will complete LB dressing with MOD A  Skilled Therapeutic Interventions/Progress Updates:     AM Session:  Pt received sitting up in wc, dressed and ready for the day with all ADL needs met, declining need for shower. Pt presenting with flat affect, receptive to skilled OT session reporting 0/10 pain- OT offering intermittent rest breaks, repositioning, and therapeutic support to optimize participation in therapy session. Pt reserved and quiet throughout session, OT attempting to engage Pt in light hearted conversation to improve moral and build rapport- Pt reporting she enjoys cooking/cleaning and taking care of her home. Transported Pt to therapy gym total A in wc for time management and energy conservation. Engaged Pt in completing series of activities to facilitate NMR to R UE for improved functional use during ADLs and IADLs. Began in seated position with Pt instructed to reach across midline using R UE to retrieve cones and then stack them on opposite side of table to facilitate targeted reach. Progressed activity to complete in standing position for increased dual tasking challenge. She is able to maintain grasp on cones with L UE, however her  movement patterns are slowed and she with often use a compensatory grasp pattern as it is fast requiring consistent verba cues to correct. In seated position at table, Pt then tasked with setting up Jenga blocks for [facility] challenge completing with increased time and MAX effort. Pt then able to utilize three-jaw chuck pinch to retrieve and place blocks into cups x10 trials with MIN dropping. Engaged Pt in completing table glides to simulate cleaning table top and to work on functional movement patterns completing grasp/release of wash cloth, scapular protraction/retraction, and scapular circumduction CW/CCW with OT providing verbal and tactile cues to avoid compensatory shoulder elevation. At end of session, Pt cleaned up therapy supplies and table top with SUP +increased time. Transported back to room total A in wc. Stand pivot wc > EOB no AD CGA. EOB > supine SUP. Pt was left resting in bed with call bell in reach, bed alarm on, and all needs met.    PM Session:  Pt received semi-reclined in bed presenting to be frustrated requiring MOD therapeutic support to become receptive to skilled OT session reporting 0/10 pain- OT offering intermittent rest breaks, repositioning, and therapeutic support to optimize participation in therapy session. Pt expressing this therapy is too much, y'all expect too much from me. Provided gentle education on purpose of therapies to increase independence and safety in preparation for d/c and educated that expectations for therapy in IPR setting is for Pt to participate in ~3 hours/day as they can tolerate and do their best. Pt more agreeable to session following education. She transitioned to EOB with SUP +increased time.  OT inquired if Pt needs restroom or to shower during session with Pt then becoming frustrated again stating I am not doing anything to need a shower, I can't do that. Educated that OT was just trying to ensure her basic needs were taken care of and that she was  there to assist her if needed during ADLs- Pt continued to decline ADLs. Pt receptive to going to therapy gym. She completed stand pivot EOB > wc with light CGA provided for safety and MAX verbal cues required for technique and sequencing. Pt very unsafe during transfer, however declining therapist assist. Transport to therapy gym total A in wc. Stand pivot wc > EOM using RW CGA +MAX verbal cues for sequencing and technique- more safe this trial compared to previous. Engaged Pt in dynamic standing balance horse shoe toss activity to challenge reactionary balance, facilitate improved functional use of R UE, and to work on standing tolerance with Pt able to complete 8x3 trials with CGA-MIN A for balance. Pt fearful in standing position without AD, however becoming more confident with increased practice and therapeutic support. Pt then completed seated ball toss activity to work on reciprocal B UE movements and grading force- decreased GMC of R UE noted with slowed movements. Graded up activity to complete in standing position 1 min x3 trials with CGA provided for balance, however MIN A required to correct LOB x3. Stand pivot EOM > wc using RW CGA +MAX verbal cues. Transported Pt back to room total A in wc. Stand pivot wc > EOB using RW CGA. EOB > Supine SUP using bed rail and MAX effort. Pt was left resting in bed with call bell in reach, bed alarm on, and all needs met.    Therapy Documentation Precautions:  Precautions Precautions: Fall Recall of Precautions/Restrictions: Impaired Restrictions Weight Bearing Restrictions Per Provider Order: No   Therapy/Group: Individual Therapy  Katheryn SHAUNNA [place]ines 07/24/2024, 7:33 AM

## 2024-07-24 NOTE — Plan of Care (Signed)
°  Problem: Consults Goal: RH STROKE PATIENT EDUCATION Description: See Patient Education module for education specifics  Outcome: Progressing Goal: Diabetes Guidelines if Diabetic/Glucose > 140 Description: If diabetic or lab glucose is > 140 mg/dl - Initiate Diabetes/Hyperglycemia Guidelines & Document Interventions  Outcome: Progressing   Problem: RH BOWEL ELIMINATION Goal: RH STG MANAGE BOWEL WITH ASSISTANCE Description: STG Manage Bowel with min Assistance. Outcome: Progressing   Problem: RH BLADDER ELIMINATION Goal: RH STG MANAGE BLADDER WITH ASSISTANCE Description: STG Manage Bladder With min Assistance Outcome: Progressing   Problem: RH SKIN INTEGRITY Goal: RH STG SKIN FREE OF INFECTION/BREAKDOWN Description: Manage skin free of infection with min assistance Outcome: Progressing   Problem: RH SAFETY Goal: RH STG ADHERE TO SAFETY PRECAUTIONS W/ASSISTANCE/DEVICE Description: STG Adhere to Safety Precautions With min Assistance/Device. Outcome: Progressing   Problem: RH PAIN MANAGEMENT Goal: RH STG PAIN MANAGED AT OR BELOW PT'S PAIN GOAL Description: <4 w/ prns Outcome: Progressing   Problem: RH KNOWLEDGE DEFICIT Goal: RH STG INCREASE KNOWLEDGE OF DIABETES Description: Manage increase knowledge diabetes with min assistance from daughter using educational materials provided Outcome: Progressing Goal: RH STG INCREASE KNOWLEDGE OF HYPERTENSION Description: Manage increase knowledge of hypertension   with min assistance from daughter using educational materials provided Outcome: Progressing Goal: RH STG INCREASE KNOWLEGDE OF HYPERLIPIDEMIA Description: Manage increase knowledge of hyperlipidemia  with min assistance from daughter using educational materials provided Outcome: Progressing Goal: RH STG INCREASE KNOWLEDGE OF STROKE PROPHYLAXIS Outcome: Progressing

## 2024-07-24 NOTE — Progress Notes (Signed)
 Physical Therapy Session Note  Patient Details  Name: Darlene Pugh MRN: [id number] Date of Birth: February 04, 1961  Today's Date: 07/24/2024 PT Individual Time: 9195-9152 PT Individual Time Calculation (min): 43 min   Short Term Goals: Week 1:  PT Short Term Goal 1 (Week 1): Pt will transfer sup to sit w/ CGA consistently w/o bed features. PT Short Term Goal 2 (Week 1): Pt will transfer sit to stand w/ min A consistently. PT Short Term Goal 3 (Week 1): Pt will amb x 25' w/ RW and min A PT Short Term Goal 4 (Week 1): PT to assess stairs.  Skilled Therapeutic Interventions/Progress Updates:  Patient supine in bed on entrance to room. Patient alert and agreeable to PT session.   Patient with no pain complaint at start of session.  Therapeutic Activity: Bed Mobility: Pt performed supine > sit with CGA. VC/ tc required for encouragement and use of RUE as pt tends to discount use of hand. While seated EOB, pt able to dress self with provided clothing. Again requires vc to use R hand. She does attempt to use R hand but then disregards when it does not move fast or strong enough. Dons pants with CGA, t-shirt with supervision. Does not like shoes her family brought for her, so maintains grip socks.  Transfers: Pt performed sit<>stand from EOB although hesitant without RW. Cued to use bedrail as pt performs rise to stand very well. Is able to stand and even release bedrail in order to assist with pulling up pants with BUE. Sit<>stand and stand pivot transfers throughout session with CGA when positioned toward edge of seat. Cues provided for technique and encouragement.   Neuromuscular Re-ed/ Gait Training: NMR facilitated during session with focus on motor control, standing balance. Pt guided in RLE step ups to 4 and then progressed to 6 step in // bars for stability. With each step, initially has trouble with full lift and catch of toe wit hfull foot placement on step but does demo improvement  with practice and vc for increased awareness of movement. Progressed to ambulation. NMR performed for improvements in motor control and coordination, balance, sequencing, judgement, and self confidence/ efficacy in performing all aspects of mobility at highest level of independence. Seated rest break between bouts.   Pt then able to ambulate with +2 for w/c follow. Is able to reach 85 ft using RW with vc for increased step height, heel-to-toe advancement and upright posture.   Patient seated upright in w/c at end of session with brakes locked, belt alarm set, and all needs within reach.   Therapy Documentation Precautions:  Precautions Precautions: Fall Recall of Precautions/Restrictions: Impaired Restrictions Weight Bearing Restrictions Per Provider Order: No  Pain:     Therapy/Group: Individual Therapy  [name] PT, DPT, CSRS 07/24/2024, 9:16 AM

## 2024-07-25 LAB — GLUCOSE, CAPILLARY
Glucose-Capillary: 112 mg/dL — ABNORMAL HIGH (ref 70–99)
Glucose-Capillary: 109 mg/dL — ABNORMAL HIGH (ref 70–99)
Glucose-Capillary: 113 mg/dL — ABNORMAL HIGH (ref 70–99)
Glucose-Capillary: 190 mg/dL — ABNORMAL HIGH (ref 70–99)

## 2024-07-25 NOTE — Progress Notes (Signed)
 Speech Language Pathology Daily Session Note  Patient Details  Name: Darlene Pugh MRN: [id number] Date of Birth: [birth year]  Today's Date: 07/25/2024 SLP Individual Time: 9054-8969 SLP Individual Time Calculation (min): 45 min  Short Term Goals: Week 1: SLP Short Term Goal 1 (Week 1): Patient will utilize swallowing compensatory strategies during consumption of least restrictive diet given min verbal A SLP Short Term Goal 2 (Week 1): Patient will demonstrate orientation to situation given min verbal A SLP Short Term Goal 3 (Week 1): Patient will name functional items with 100% accuracy given min multimodal A SLP Short Term Goal 4 (Week 1): Patient will recall daily events given mod multimodal A SLP Short Term Goal 5 (Week 1): Patient will demonstrate functional problem solving skills during daily tasks given mod multimodal A  Skilled Therapeutic Interventions: Skilled therapy session focused on cognitive goals. SLP facilitated session by targeting orientation. Patient oriented to self, location and month/year independently, however required modA to recall reason for admission (CVA). Patient extremely flat throughout session requiring consistent encouragement to participate in cognitive tasks. SLP facilitated completion of medication management task to encourage use of problem solving, memory and organizational skills. SLP provided supervisionA for patient to complete BID pillbox according to written and verbalized directions. Patient with 2 mistakes, though able to self correct with min cues. SLP educated patient on continuing to utilize pillbox upon d/c to aid in planning, organization, and memory. Patient verbalized understanding. SLP continued to challenge patient through identification of medication mistakes task. Patient required minA to identify errors in BID pillbox and correct according to directions. At the end of the session, SLP educated patient re: BEFAST stroke s/sx. Patient  left in chair with alarm set and call bell in reach. Continue POC.   Pain denies  Therapy/Group: Individual Therapy  Lindsy Cerullo M.A., CCC-SLP 07/25/2024, 7:51 AM

## 2024-07-25 NOTE — Progress Notes (Signed)
 Occupational Therapy Session Note  Patient Details  Name: Darlene Pugh MRN: [id number] Date of Birth: [birth year]  Today's Date: 07/25/2024 OT Individual Time: 8697-8597 OT Individual Time Calculation (min): 60 min  and Today's Date: 07/25/2024 OT Missed Time: 15 Minutes Missed Time Reason: Other (comment) (pt requesting to eat her lunch)   Short Term Goals: Week 1:  OT Short Term Goal 1 (Week 1): Pt will recall hemi-dressing techniques with MIN questioning cues OT Short Term Goal 2 (Week 1): Pt will complete toilet transfers with MIN A consistently using LRAD OT Short Term Goal 3 (Week 1): Pt will complete toileting with MIN A using LRAD OT Short Term Goal 4 (Week 1): Pt will utilize R UE at a gross assist level with SUP OT Short Term Goal 5 (Week 1): Pt will complete LB dressing with MOD A  Skilled Therapeutic Interventions/Progress Updates:     Pt received semi-reclined in bed presenting with flat affect requiring consistent encouragement throughout session for participation. Pt receptive to skilled OT session reporting 0/10 pain- OT offering intermittent rest breaks, repositioning, and therapeutic support to optimize participation in therapy session. Pt receptive to taking shower this PM. She transitioned to EOB using bed rail with SUP +increased time. Stand pivot EOB > wc using RW CGA +increased time and MOD verbal cues for technique and sequencing. Transport to shower via w/c. She completed stand pivot transfer wc <> TTB using grab bar with CGA +MOD verbal cues for technique d/t first learning experience. Doffed OH shirt seated SUP and brief CGA in standing. Pt completed entire shower in seated position for safety and energy conservation. Improved functional use of R UE during bathing at a diminished level with MIN verbal cues. She completed lateral leans to wash buttocks with CGA for dynamic sitting balance completing shower at overall CGA level. Following shower, she dried  herself with MIN A to dry buttocks and completed dressing while seated in w/c. Donned OH gown with SUP and brief donned with MOD A, maintaining standing balance SUP using grab bar. Discussed d/c plans and d/c location with Pt- Pt reporting her children are planning to assist her at d/c, they can provide 24/7 SUP. Recommending Pt use a TTB at d/c. Pt's lunch tray then arriving and Pt requesting to finish session early to eat lunch. Missed 15 min skilled OT treatment, will attempt to make up time as Pt's status and schedule allow. Pt was left resting in wc with call bell in reach, seatbelt alarm on, and all needs met.    Therapy Documentation Precautions:  Precautions Precautions: Fall Recall of Precautions/Restrictions: Impaired Restrictions Weight Bearing Restrictions Per Provider Order: No   Therapy/Group: Individual Therapy  Darlene Pugh 07/25/2024, 7:35 AM

## 2024-07-25 NOTE — Progress Notes (Signed)
 Physical Therapy Session Note  Patient Details  Name: Darlene Pugh MRN: [id number] Date of Birth: Jan 05, 1961  Today's Date: 07/25/2024 PT Individual Time: 8967-8884 PT Individual Time Calculation (min): 43 min   Short Term Goals: Week 1:  PT Short Term Goal 1 (Week 1): Pt will transfer sup to sit w/ CGA consistently w/o bed features. PT Short Term Goal 2 (Week 1): Pt will transfer sit to stand w/ min A consistently. PT Short Term Goal 3 (Week 1): Pt will amb x 25' w/ RW and min A PT Short Term Goal 4 (Week 1): PT to assess stairs.  Skilled Therapeutic Interventions/Progress Updates:  Patient seated upright in w/c on entrance to room. Patient alert and agreeable to PT session.   Patient with no pain complaint at start of session.  Pt does relate that she has improved in feeling of urinary urgency. Reminded pt to use call bell to request assist to toilet on feeling first urge.   Therapeutic Activity: Transfers: Pt performed sit<>stand and stand pivot transfers throughout session with CGA/ MinA. She requires vc/ tc for technique and hand placement especially when RW placed in front of her as she reaches RW for use.  Neuromuscular Re-ed: NMR facilitated during session with focus on motor control and standing balance. Pt guided in placement of R foot to 6 step with use of handrails. She requires MinA for full toe clearance, full foot placement, and then lift of foot from step in order to return to floor. VC/ tc required throughout for improved clearance overall with increased focus and effort. By end of bout, she is able to perform with improved clearance Will continue to need focused practice.   Guided pt in proper biomechanics of performing sit<>stand. Demonstrated need for focus on sequencing forward scoot, posterior feet placement, forward hip hinge with upright posture and press of forefoot into floor. Pt apathetic throughout and initially skeptical of ability to rise to stand  with no AD. Performs well with first attempt and then she is noted to attempt to perform with less focus in order to show that she doesn't need to follow the instructions and can perform on her own.   NMR performed for improvements in motor control and coordination, balance, sequencing, judgement, and self confidence/ efficacy in performing all aspects of mobility at highest level of independence.   Noted pt in gym with positive attitude toward therapy and started conversation with  both patients. This pt noted to perk up slightly and may need more socialization to improve mood.   Gait Training:  Pt ambulated 40 ft using RW with CGA with close w/c follow. Demonstrated flexed forward posture with slide forward of R foot. Provided vc/ tc for increased step height, heel-to-toe advancement and upright posture. Pt requests to sit prior to predetermined goal point d/t increasing back pain.   Patient seated upright in w/c at end of session with brakes locked, belt alarm set, and all needs within reach.  Therapy Documentation Precautions:  Precautions Precautions: Fall Recall of Precautions/Restrictions: Impaired Restrictions Weight Bearing Restrictions Per Provider Order: No  Pain:  Back pain during ambulation that is addressed with seated therapeutic rest.    Therapy/Group: Individual Therapy  [name] PT, DPT, CSRS 07/25/2024, 3:51 PM

## 2024-07-25 NOTE — Progress Notes (Signed)
 PROGRESS NOTE   Subjective/Complaints:  Discussed meds, antibiotics  ROS: negative chest pain shortness of breath abd pain nausea vomiting diarrhea or constipation.   Objective:   No results found.   No results for input(s): WBC, HGB, HCT, PLT in the last 72 hours.  No results for input(s): NA, K, CL, CO2, GLUCOSE, BUN, CREATININE, CALCIUM  in the last 72 hours.   Intake/Output Summary (Last 24 hours) at 07/25/2024 0800 Last data filed at 07/24/2024 1839 Gross per 24 hour  Intake 336 ml  Output 200 ml  Net 136 ml        Physical Exam: Vital Signs Blood pressure 104/65, pulse 82, temperature 98.3 F (36.8 C), resp. rate 18, height 5' 4 (1.626 m), weight 74 kg, SpO2 97%.  General: No acute distress, appears comfortable, sitting up in bed Heart: Regular rate and rhythm no rubs murmurs or extra sounds Lungs: Clear to auscultation, breathing unlabored, no rales or wheezes Abdomen: hypoactive bowel sounds, soft, nontender to palpation, nondistended but obese Extremities: No clubbing, cyanosis, or edema Skin: No evidence of breakdown, no evidence of rash over exposed surfaces Psych: flat  PRIOR EXAMS: Neurologic: Cranial nerves II through XII intact, motor strength is 5/5 in left and 3 -/5 right deltoid, bicep, tricep, grip, hip flexor, knee extensors, 5/5 left and 2 -/5 right ankle dorsiflexor and plantar flexor Sensory exam normal sensation to light touch in bilateral upper and lower extremities Cerebellar exam normal finger to nose to finger as well as heel to shin in left upper and lower extremities, right upper extremity testing limited by weakness Musculoskeletal: Full range of motion in all 4 extremities. No joint swelling, no pain with range of motion      Assessment/Plan: 1. Functional deficits which require 3+ hours per day of interdisciplinary therapy in a comprehensive inpatient  rehab setting. Physiatrist is providing close team supervision and 24 hour management of active medical problems listed below. Physiatrist and rehab team continue to assess barriers to discharge/monitor patient progress toward functional and medical goals  Care Tool:  Bathing    Body parts bathed by patient: Right arm, Chest, Abdomen, Front perineal area, Right upper leg, Buttocks, Left upper leg, Face   Body parts bathed by helper: Left arm, Right lower leg, Left lower leg     Bathing assist Assist Level: Moderate Assistance - Patient 50 - 74%     Upper Body Dressing/Undressing Upper body dressing   What is the patient wearing?: Pull over shirt    Upper body assist Assist Level: Moderate Assistance - Patient 50 - 74%    Lower Body Dressing/Undressing Lower body dressing      What is the patient wearing?: Pants     Lower body assist Assist for lower body dressing: Moderate Assistance - Patient 50 - 74%     Toileting Toileting    Toileting assist Assist for toileting: Maximal Assistance - Patient 25 - 49%     Transfers Chair/bed transfer  Transfers assist     Chair/bed transfer assist level: Minimal Assistance - Patient > 75%     Locomotion Ambulation   Ambulation assist      Assist level: Moderate Assistance -  Patient 50 - 74% Assistive device: Walker-rolling Max distance: 11   Walk 10 feet activity   Assist     Assist level: Moderate Assistance - Patient - 50 - 74% Assistive device: Walker-rolling   Walk 50 feet activity   Assist Walk 50 feet with 2 turns activity did not occur: Safety/medical concerns         Walk 150 feet activity   Assist Walk 150 feet activity did not occur: Safety/medical concerns         Walk 10 feet on uneven surface  activity   Assist Walk 10 feet on uneven surfaces activity did not occur: Safety/medical concerns         Wheelchair     Assist Is the patient using a wheelchair?: Yes Type of  Wheelchair: Manual    Wheelchair assist level: Moderate Assistance - Patient 50 - 74% Max wheelchair distance: 30    Wheelchair 50 feet with 2 turns activity    Assist        Assist Level: Maximal Assistance - Patient 25 - 49%   Wheelchair 150 feet activity     Assist      Assist Level: Total Assistance - Patient < 25%   Blood pressure 104/65, pulse 82, temperature 98.3 F (36.8 C), resp. rate 18, height 5' 4 (1.626 m), weight 74 kg, SpO2 97%.  Medical Problem List and Plan: 1. 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  2.  Antithrombotics: -DVT/anticoagulation:  Pharmaceutical: Xarelto  10mg  daily x 30 days for DVT prophylaxis -antiplatelet therapy: Aspirin  81 mg daily and Plavix  75 mg daily x 3 weeks then Plavix  alone-confirmed with IM team  3. Pain Management: Voltaren  gel 2 g 4 times daily, lidocaine  patches, tylenol  PRN  4. Mood/Behavior/Sleep: Provide emotional support             -antipsychotic agents: N/A  5. Neuropsych/cognition: This patient is capable of making decisions on her own behalf.  6. Skin/Wound Care: Routine skin checks  7. Fluids/Electrolytes/Nutrition: Routine in and outs with follow-up chemistries, continue vitamins/supplements. Pepcid  40mg  daily.  8.  Hypertension.  Norvasc  5 mg daily, Cozaar  25 mg daily.  Monitor with increased mobility. Avoid hypotension  -12/12-14 controlled, continue to monitor  Vitals:   07/21/24 1338 07/21/24 1930 07/22/24 0430 07/22/24 1258  BP: 108/76 100/67 121/84 123/86   07/22/24 2218 07/23/24 0510 07/23/24 1333 07/23/24 2033  BP: 110/84 127/83 101/73 126/78   07/24/24 0437 07/24/24 1308 07/24/24  1931 07/25/24 0439  BP: 106/67 109/75 121/84 104/65    9.  Diabetes mellitus.  Hemoglobin A1c 9.5.  Glucophage  XR 500 mg daily, NovoLog  3 units 3 times daily, Lantus  insulin  14 units daily, Farxiga  10 mg daily -12/12 CBGs elevated yesterday, glucerna changed to ensure max, HS SSI was started. CBG stable this AM -12/13-14/25 CBGs decent, cont regimen CBG (last 3)  Recent Labs    07/24/24 1633 07/24/24 2123 07/25/24 0619  GLUCAP 135* 87 112*    10.  Hyperlipidemia.  Lipitor  80mg  daily, Zetia  10mg  daily  11.  Tobacco abuse as well as history of alcohol.  NicoDerm patch daily-21mg  dose.  Alcohol negative on admission.  12.  Constipation. Increase miralax  to BID, continue senokot 1 tab BID, start sorbitol  PRN  -  13.  L knee pain.  Improved with conservative treatment.  X-ray indicates effusion and OA.  14. Urinary urgency/pressure: -07/22/24 c/o pressure and urgency, had retention and UTI at admission to hospital, treated with 3d of Ceftriaxone  per notes.  Has UTI but cult showed multiple species will recollect Reduce keflex  to BID      LOS: 6 days A FACE TO FACE EVALUATION WAS PERFORMED  Darlene Pugh 07/25/2024, 8:00 AM

## 2024-07-25 NOTE — Progress Notes (Signed)
 Physical Therapy Session Note  Patient Details  Name: Darlene Pugh MRN: [id number] Date of Birth: 1961/05/04  Today's Date: 07/25/2024 PT Individual Time: 0804-0900 PT Individual Time Calculation (min): 56 min   Short Term Goals: Week 1:  PT Short Term Goal 1 (Week 1): Pt will transfer sup to sit w/ CGA consistently w/o bed features. PT Short Term Goal 2 (Week 1): Pt will transfer sit to stand w/ min A consistently. PT Short Term Goal 3 (Week 1): Pt will amb x 25' w/ RW and min A PT Short Term Goal 4 (Week 1): PT to assess stairs.  Skilled Therapeutic Interventions/Progress Updates:     Pt received semi reclined in bed. No complaint of pain. Pt performs bed mobility with bed features and no physical assistance required. PT provides setup assistance to don clean shirt and pants and provides minA to tight brief. Otherwise pt performs without assistance. Pt stands once from EOB without requiring physical assistance, with verbal cues for hand placement and sequencing. For 2nd stand pt requires minA to facilitate anterior weight shifting. Stand step transfer from bed to [facility] with minA/modA and cues for safe RW management and positioning. Pt performs ADLs at sink while seated in WC to work on coordination and activity tolerance. WC transport to gym. Pt is extremely flat throughout session and provide little to no insight into how she feels, other than saying that ambulation is stressful. Pt ambulates 1x60' and 1x40' with seated rest break. Pt utilizes RW and PT provides primarily CGA though several instances of minA due to pt demonstrating decreased safety with RW management, flexing forward at trunk and with decresaed control when turning to sit in WC. PT provides cues to increase stride length, lateral weight shifting to the Rt side, and maintain upright gaze and neutral posture to optimize gait pattern, balance, and body mechanics. WC transport back to room. Left seated with all needs within  reach.    Therapy Documentation Precautions:  Precautions Precautions: Fall Recall of Precautions/Restrictions: Impaired Restrictions Weight Bearing Restrictions Per Provider Order: No   Therapy/Group: Individual Therapy  Elsie JAYSON Dawn, PT, DPT 07/25/2024, 4:36 PM

## 2024-07-26 LAB — GLUCOSE, CAPILLARY
Glucose-Capillary: 117 mg/dL — ABNORMAL HIGH (ref 70–99)
Glucose-Capillary: 135 mg/dL — ABNORMAL HIGH (ref 70–99)
Glucose-Capillary: 118 mg/dL — ABNORMAL HIGH (ref 70–99)
Glucose-Capillary: 150 mg/dL — ABNORMAL HIGH (ref 70–99)

## 2024-07-26 MED ORDER — AMLODIPINE BESYLATE 2.5 MG PO TABS
2.5000 mg | ORAL_TABLET | Freq: Every day | ORAL | Status: DC
  Administered 2024-07-27 – 2024-08-01 (×6): 2.5 mg via ORAL
  Filled 2024-07-26 (×6): qty 1

## 2024-07-26 MED ORDER — ADULT MULTIVITAMIN W/MINERALS CH: 1.0000 | ORAL_TABLET | Freq: Every day | ORAL | Status: AC

## 2024-07-26 MED ORDER — POLYETHYLENE GLYCOL 3350 17 G PO PACK: 17.0000 g | PACK | Freq: Every day | ORAL | Status: DC | PRN

## 2024-07-26 NOTE — Progress Notes (Shared)
 Physical Therapy Note  Patient Details  Name: Darlene Pugh MRN: [id number] Date of Birth: February 12, 1961 Today's Date: 07/26/2024    ***   [name] 07/26/2024, 6:35 AM

## 2024-07-26 NOTE — Progress Notes (Signed)
 Occupational Therapy Note  Patient Details  Name: Armoni Kludt MRN: [id number] Date of Birth: [birth year]  Occupational Therapist participated in the interdisciplinary team conference, providing clinical information regarding the patients current status, treatment goals, and weekly focus, including any barriers that need to be addressed. Please see the Inpatient Rehabilitation Team Conference and Plan of Care Update for further details.     Katheryn SQUIBB Woodson 07/26/2024, 10:45 AM

## 2024-07-26 NOTE — Progress Notes (Signed)
 Physical Therapy Note  Patient Details  Name: Darlene Pugh MRN: [id number] Date of Birth: 01/05/1961 Today's Date: 07/26/2024    Physical Therapist participated in the interdisciplinary team conference, providing clinical information regarding the patient's current status, treatment goals, and weekly focus, including any barriers that need to be addressed. Please see the Inpatient Rehabilitation Team Conference and Plan of Care Update for further details.    Jordon Bourquin P Barbaraann Avans 07/26/2024, 10:39 AM

## 2024-07-26 NOTE — Progress Notes (Signed)
 Occupational Therapy Session Note  Patient Details  Name: Darlene Pugh MRN: [id number] Date of Birth: March 04, 1961  Today's Date: 07/26/2024 OT Individual Time: 1000-1100 OT Individual Time Calculation (min): 60 min    Short Term Goals: Week 1:  OT Short Term Goal 1 (Week 1): Pt will recall hemi-dressing techniques with MIN questioning cues OT Short Term Goal 2 (Week 1): Pt will complete toilet transfers with MIN A consistently using LRAD OT Short Term Goal 3 (Week 1): Pt will complete toileting with MIN A using LRAD OT Short Term Goal 4 (Week 1): Pt will utilize R UE at a gross assist level with SUP OT Short Term Goal 5 (Week 1): Pt will complete LB dressing with MOD A  Skilled Therapeutic Interventions/Progress Updates:  Patient agreeable to participate in OT session. Reports no pain level. Patient participated in skilled OT session focusing on UE NMR, functional mobility, dynamic standing balance. Patient received in room, ready for OT. Patient transpoted to gym. Completed planko activity, placing circles across body in standing with leaning to increase dynamic standing balance CGA, with good ability to pace circles. Patient then able to complete functional toss activity seated working on release during toss with decreased ability to complete release.  OT graded activity due to frustration to increase patient efficiency to grasp and release accuracy over large circle.  Patient then able to complete functional mobility with RW for 50 ft with CGA to min A with min verbal cues. Patient then returned to room in wc. All needs left in reach, urinal set up next to patient for toileting, alarm on.    Therapy Documentation Precautions:  Precautions Precautions: Fall Recall of Precautions/Restrictions: Impaired Restrictions Weight Bearing Restrictions Per Provider Order: No  Therapy/Group: Individual Therapy  D'mariea L Landrie Beale 07/26/2024, 7:40 AM

## 2024-07-26 NOTE — Progress Notes (Signed)
 Occupational Therapy Session Note  Patient Details  Name: Darlene Pugh MRN: [id number] Date of Birth: [birth year]  Today's Date: 07/26/2024 OT Individual Time: 9199-9154 OT Individual Time Calculation (min): 45 min    Short Term Goals: Week 1:  OT Short Term Goal 1 (Week 1): Pt will recall hemi-dressing techniques with MIN questioning cues OT Short Term Goal 2 (Week 1): Pt will complete toilet transfers with MIN A consistently using LRAD OT Short Term Goal 3 (Week 1): Pt will complete toileting with MIN A using LRAD OT Short Term Goal 4 (Week 1): Pt will utilize R UE at a gross assist level with SUP OT Short Term Goal 5 (Week 1): Pt will complete LB dressing with MOD A  Skilled Therapeutic Interventions/Progress Updates:      Therapy Documentation Precautions:  Precautions Precautions: Fall Recall of Precautions/Restrictions: Impaired Restrictions Weight Bearing Restrictions Per Provider Order: No General: Pt seated EOB upon OT arrival with nsg administering meds, agreeable to OT session.  Pain: no pain reported  ADL: OT providing skilled intervention on ADL retraining in order to increase independence with tasks and increase activity tolerance. Pt completed the following tasks at the current level of assist: Grooming/oral hygiene: set up seated in W/C at sink for managing hair, able to use BUE UB dressing: SBA seated EOB for doffing and donning overhead shirt LB dressing: CGA, able to stand unsupported to manage over waist without LOB Transfers: Min A with RW d/t slight LOB with stand step transfer   Exercises: Pt completed the following exercise circuit in order to improve functional activity, strength and endurance to prepare for ADLs such as bathing. Pt completed the following exercises in seated position with no noted LOB/SOB and 3x10 repetitions on each exercise: -toe taps on cone, Vcs for clearance for RLE -shoulder raises with 3# dowel rod with decreased  motor delay as trials progressed  Pt seated in W/C at end of session with W/C alarm donned, call light within reach and 4Ps assessed.    Therapy/Group: Individual Therapy  Camie Hoe, OTD, OTR/L 07/26/2024, 10:09 AM

## 2024-07-26 NOTE — Plan of Care (Signed)
 Patient calm and cooperative with delayed responses, family at bedside during visiting hours. Skin breakdown on gluteal cleft treated with barrier ointment and maintaining turn tolerance. Patient rested peacefully with respirations at 18.  Problem: Consults Goal: RH STROKE PATIENT EDUCATION Description: See Patient Education module for education specifics  Outcome: Progressing   Problem: RH BOWEL ELIMINATION Goal: RH STG MANAGE BOWEL WITH ASSISTANCE Description: STG Manage Bowel with min Assistance. Outcome: Progressing   Problem: RH SKIN INTEGRITY Goal: RH STG SKIN FREE OF INFECTION/BREAKDOWN Description: Manage skin free of infection with min assistance Outcome: Progressing   Problem: RH SAFETY Goal: RH STG ADHERE TO SAFETY PRECAUTIONS W/ASSISTANCE/DEVICE Description: STG Adhere to Safety Precautions With min Assistance/Device. Outcome: Progressing   Problem: RH PAIN MANAGEMENT Goal: RH STG PAIN MANAGED AT OR BELOW PT'S PAIN GOAL Description: <4 w/ prns Outcome: Progressing   Problem: RH KNOWLEDGE DEFICIT Goal: RH STG INCREASE KNOWLEDGE OF DIABETES Description: Manage increase knowledge diabetes with min assistance from daughter using educational materials provided Outcome: Progressing   Problem: Education: Goal: Ability to describe self-care measures that may prevent or decrease complications (Diabetes Survival Skills Education) will improve Outcome: Progressing   Problem: Coping: Goal: Ability to adjust to condition or change in health will improve Outcome: Progressing   Problem: Health Behavior/Discharge Planning: Goal: Ability to identify and utilize available resources and services will improve Outcome: Progressing   Problem: Metabolic: Goal: Ability to maintain appropriate glucose levels will improve Outcome: Progressing

## 2024-07-26 NOTE — Progress Notes (Signed)
 PROGRESS NOTE   Subjective/Complaints:  Pt requesting MD fill out FMLA form for her daughter , so she can take care of pt post discharge   ROS: negative chest pain shortness of breath abd pain nausea vomiting diarrhea or constipation.   Objective:   No results found.   No results for input(s): WBC, HGB, HCT, PLT in the last 72 hours.  No results for input(s): NA, K, CL, CO2, GLUCOSE, BUN, CREATININE, CALCIUM  in the last 72 hours.   Intake/Output Summary (Last 24 hours) at 07/26/2024 0854 Last data filed at 07/26/2024 0745 Gross per 24 hour  Intake 118 ml  Output 550 ml  Net -432 ml        Physical Exam: Vital Signs Blood pressure 108/77, pulse 76, temperature 97.9 F (36.6 C), temperature source Oral, resp. rate 17, height 5' 4 (1.626 m), weight 74 kg, SpO2 96%.  General: No acute distress, appears comfortable, sitting up in bed Heart: Regular rate and rhythm no rubs murmurs or extra sounds Lungs: Clear to auscultation, breathing unlabored, no rales or wheezes Abdomen: hypoactive bowel sounds, soft, nontender to palpation, nondistended but obese Extremities: No clubbing, cyanosis, or edema Skin: No evidence of breakdown, no evidence of rash over exposed surfaces Psych: flat  PRIOR EXAMS: Neurologic: Cranial nerves II through XII intact, motor strength is 5/5 in left and 3 -/5 right deltoid, bicep, tricep, grip, hip flexor, knee extensors, 5/5 left and 3 -/5 right ankle dorsiflexor and plantar flexor Sensory exam normal sensation to light touch in bilateral upper and lower extremities Cerebellar exam normal finger to nose to finger as well as heel to shin in left upper and lower extremities, right upper extremity testing limited by weakness Musculoskeletal: Full range of motion in all 4 extremities. No joint swelling, no pain with range of motion      Assessment/Plan: 1. Functional  deficits which require 3+ hours per day of interdisciplinary therapy in a comprehensive inpatient rehab setting. Physiatrist is providing close team supervision and 24 hour management of active medical problems listed below. Physiatrist and rehab team continue to assess barriers to discharge/monitor patient progress toward functional and medical goals  Care Tool:  Bathing    Body parts bathed by patient: Right arm, Chest, Abdomen, Front perineal area, Right upper leg, Buttocks, Left upper leg, Face   Body parts bathed by helper: Left arm, Right lower leg, Left lower leg     Bathing assist Assist Level: Moderate Assistance - Patient 50 - 74%     Upper Body Dressing/Undressing Upper body dressing   What is the patient wearing?: Pull over shirt    Upper body assist Assist Level: Moderate Assistance - Patient 50 - 74%    Lower Body Dressing/Undressing Lower body dressing      What is the patient wearing?: Pants     Lower body assist Assist for lower body dressing: Moderate Assistance - Patient 50 - 74%     Toileting Toileting    Toileting assist Assist for toileting: Maximal Assistance - Patient 25 - 49%     Transfers Chair/bed transfer  Transfers assist     Chair/bed transfer assist level: Minimal Assistance - Patient >  75%     Locomotion Ambulation   Ambulation assist      Assist level: Moderate Assistance - Patient 50 - 74% Assistive device: Walker-rolling Max distance: 11   Walk 10 feet activity   Assist     Assist level: Moderate Assistance - Patient - 50 - 74% Assistive device: Walker-rolling   Walk 50 feet activity   Assist Walk 50 feet with 2 turns activity did not occur: Safety/medical concerns         Walk 150 feet activity   Assist Walk 150 feet activity did not occur: Safety/medical concerns         Walk 10 feet on uneven surface  activity   Assist Walk 10 feet on uneven surfaces activity did not occur: Safety/medical  concerns         Wheelchair     Assist Is the patient using a wheelchair?: Yes Type of Wheelchair: Manual    Wheelchair assist level: Moderate Assistance - Patient 50 - 74% Max wheelchair distance: 30    Wheelchair 50 feet with 2 turns activity    Assist        Assist Level: Maximal Assistance - Patient 25 - 49%   Wheelchair 150 feet activity     Assist      Assist Level: Total Assistance - Patient < 25%   Blood pressure 108/77, pulse 76, temperature 97.9 F (36.6 C), temperature source Oral, resp. rate 17, height 5' 4 (1.626 m), weight 74 kg, SpO2 96%.  Medical Problem List and Plan: 1. 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 Team conference today please see physician documentation under team conference tab, met with team  to discuss problems,progress, and goals. Formulized individual treatment plan based on medical history, underlying problem and comorbidities.  2.  Antithrombotics: -DVT/anticoagulation:  Pharmaceutical: Xarelto  10mg  daily x 30 days for DVT prophylaxis -antiplatelet therapy: Aspirin  81 mg daily and Plavix  75 mg daily x 3 weeks then Plavix  alone-confirmed with IM team  3. Pain Management: Voltaren  gel 2 g 4 times daily, lidocaine  patches, tylenol  PRN  4. Mood/Behavior/Sleep: Provide emotional support             -antipsychotic agents: N/A  5. Neuropsych/cognition: This patient is capable of making decisions on her own behalf.  6. Skin/Wound Care: Routine skin checks  7. Fluids/Electrolytes/Nutrition: Routine in and outs with follow-up chemistries, continue vitamins/supplements. Pepcid  40mg   daily.  8.  Hypertension.  Norvasc  5 mg daily, Cozaar  25 mg daily.  Monitor with increased mobility. Avoid hypotension  -12/12-14 controlled, continue to monitor  Vitals:   07/22/24 1258 07/22/24 2218 07/23/24 0510 07/23/24 1333  BP: 123/86 110/84 127/83 101/73   07/23/24 2033 07/24/24 0437 07/24/24 1308 07/24/24 1931  BP: 126/78 106/67 109/75 121/84   07/25/24 0439 07/25/24 1325 07/25/24 1930 07/26/24 0449  BP: 104/65 112/72 112/72 108/77    9.  Diabetes mellitus.  Hemoglobin A1c 9.5.  Glucophage  XR 500 mg daily, NovoLog  3 units 3 times daily, Lantus  insulin  14 units daily, Farxiga   10 mg daily -12/12 CBGs elevated yesterday, glucerna changed to ensure max, HS SSI was started. CBG stable this AM -12/13-14/25 CBGs decent, cont regimen CBG (last 3)  Recent Labs    07/25/24 1622 07/25/24 2021 07/26/24 0546  GLUCAP 113* 190* 117*    10.  Hyperlipidemia.  Lipitor  80mg  daily, Zetia  10mg  daily  11.  Tobacco abuse as well as history of alcohol.  NicoDerm patch daily-21mg  dose.  Alcohol negative on admission.  12.  Constipation. Increase miralax  to BID, continue senokot 1 tab BID, start sorbitol  PRN  -  13.  L knee pain.  Improved with conservative treatment.  X-ray indicates effusion and OA.  14. Urinary urgency/pressure: -07/22/24 c/o pressure and urgency, had retention and UTI at admission to hospital, treated with 3d of Ceftriaxone  per notes.  Has UTI but cult showed multiple species will recollect Reduce keflex  to BID      LOS: 7 days A FACE TO FACE EVALUATION WAS PERFORMED  Prentice FORBES Compton 07/26/2024, 8:54 AM

## 2024-07-26 NOTE — Patient Care Conference (Signed)
 Inpatient RehabilitationTeam Conference and Plan of Care Update Date: 07/26/2024   Time: 10:39 AM   Patient was admitted after the interdisciplinary team meeting took place on 07/19/2024; as a result, the first team conference occurred on day 8 of this patient's stay.     Patient Name: Darlene Pugh [facility]      Medical Record Number: [id number]  Date of Birth: [birth year] Sex: Female         Room/Bed: 4W26C/4W26C-01 Payor Info: Payor: [place] MEDICAID PREPAID HEALTH PLAN / Plan: [place] MEDICAID [employer] / Product Type: *No Product type* /    Admit Date/Time:  07/19/2024  2:02 PM  Primary Diagnosis:  Infarction of left basal ganglia [facility])  Hospital Problems: Principal Problem:   Infarction of left basal ganglia [facility])    Expected Discharge Date: Expected Discharge Date: 08/01/24  Team Members Present: Physician leading conference: Dr. Prentice Compton Social Worker Present: Rhoda Clement, LCSW Nurse Present: Barnie Ronde, RN PT Present: Sherlean Perks, PT OT Present: Katheryn Mines, OT SLP Present: Blaise Alderman, SLP PPS Coordinator present : Eleanor Colon, SLP     Current Status/Progress Goal Weekly Team Focus  Bowel/Bladder   Patient incontinent of bladder at times   Promote continence, incourage patient to use call bell before going to bathroom   Assess patient continence Q shift and PRN    Swallow/Nutrition/ Hydration   reg/thin, meds crushed   supervision  use of strategies (oral holding 2 seconds before swallow initiation), tolerance, reduced s/sx aspiration    ADL's   UB ADLs SUP, LB ADLs CGA-MIN A, toileting MIN A, toilet/shower transfers via stand pivot using RW CGA-MIN A // Barriers: low frustration tolerance, fear of falling, decreased family support   CGA LB ADLs & transfers, SUP UB ADLs, MIN A IADLs   therapeutic use of self, dynamic balance, ADL retraining, IADL retraining, R NMRE, functional transfers    Mobility   Bed mobility = MinA/  CGA, transfers = CGA using RW - light minA without AD, ambulation = CGA/ MinA for 30-80 ft using RW with increasing R lean with fatigue   SUP for transfers, CGA for gait - may need to upgrade to Mod I and SUP  Barriers: significant general apathy, R weakness /// Work on: R hemibody NMR, general strengthening, LOA with transfers and gait, quality of gait, may also require some socialization during stay to increase mood    Communication   80-90% intelligibile   supervision @95 %   word finding strategies, speech intelligibility (SLOP, IOPI)    Safety/Cognition/ Behavioral Observations  unable to truly target due to refusal to participate   supervision-minA   memory, intellectual awareness, problem solving, orientation    Pain   Patient reported headache this shift 7 out of 10   Reduction in pain level on scale of 0-10   Assess patient pain and treat according to [facility]    Skin   Breakdown in gluteal cleft   Continue to reposition patient and use barrier oitment to promote healing  Assess patient and apply oitment while maintaining turn tolerance.      Discharge Planning:  According to family will roatate and be able to provde care at discharge. Pt feels at times will be alone, will need assist. Await team's recommendations and work on dc needs   Team Discussion: Patient admitted post left basal ganglia CVA with right lean and dysarthria. Functional progress limited by low frustration tolerance level, apathy towards participation in therapy and resistance to cues/recommendations.  Patient  on target to meet rehab goals: Currently needs supervision for upper body care and CGA for lower body care with CGA for showering seated. Needs min assist for toileting. Transfers using a RW with CGA and able to ambulate up to 43' using a RW with CGA. Goals for discharge set for CGA overall.  *See Care Plan and progress notes for long and short-term goals.   Revisions to Treatment Plan:  UTI  treated Neuro psych consult   Teaching Needs: Safety, medications, dietary modification, transfers, toileting, etc.   Current Barriers to Discharge: Decreased caregiver support, Home enviroment access/layout, and Behavior  Possible Resolutions to Barriers: Family education      Medical Summary Current Status: hx of SUD, pt with poor compliance for meds ,depressed mood, reduced safety awareness  Barriers to Discharge: Medical stability;Hypotension;Behavior/Mood;Self-care education   Possible Resolutions to [employer] Focus: hx of poor compliance and lifestyle choices, work on safety, pt education , Neuropsych eval   Continued Need for Acute Rehabilitation Level of Care: The patient requires daily medical management by a physician with specialized training in physical medicine and rehabilitation for the following reasons: Direction of a multidisciplinary physical rehabilitation program to maximize functional independence : Yes Medical management of patient stability for increased activity during participation in an intensive rehabilitation regime.: Yes Analysis of laboratory values and/or radiology reports with any subsequent need for medication adjustment and/or medical intervention. : Yes   I attest that I was present, lead the team conference, and concur with the assessment and plan of the team.   Fredericka Sober B 07/26/2024, 2:27 PM

## 2024-07-26 NOTE — Progress Notes (Signed)
 Occupational Therapy Session Note  Patient Details  Name: Darlene Pugh MRN: [id number] Date of Birth: [birth year]  Today's Date: 07/26/2024 OT Individual Time: 1304-1400 OT Individual Time Calculation (min): 56 min    Short Term Goals: Week 1:  OT Short Term Goal 1 (Week 1): Pt will recall hemi-dressing techniques with MIN questioning cues OT Short Term Goal 2 (Week 1): Pt will complete toilet transfers with MIN A consistently using LRAD OT Short Term Goal 3 (Week 1): Pt will complete toileting with MIN A using LRAD OT Short Term Goal 4 (Week 1): Pt will utilize R UE at a gross assist level with SUP OT Short Term Goal 5 (Week 1): Pt will complete LB dressing with MOD A  Skilled Therapeutic Interventions/Progress Updates:     Pt received sitting up in wc, dressed and ready for the day with all BADL needs met upon OT arrival. Pt presenting with flat affect with consistent motivation and therapeutic support required to facilitate participation in therapy session. Pt reporting 0/10 pain- OT offering intermittent rest breaks, repositioning, and therapeutic support to optimize participation in therapy session. Transported Pt to therapy gym in wc total A for time management and energy conservation.   Stand pivot wc > EOM using RW CGA.   Pt participated in dynamic standing balance activity using RW with posterior reaching, maintained grasp, and OH reaching incorporated into activity to simulate skills required for ADLs/IADL. Pt tasked with removing moderate resistance clothes pins from posterior portion of shirt and donning onto vertical pole completing total of 2x10 with CGA and min dropping. MIN-MOD use of compensatory grasp patterns noted, able to correct with verbal cues.   Engaged Pt in completing step taps to cone using RW for balance to work on single leg balance and coordination. Large vertical mirror positioned anterior to Pt for visual feedback to improve body awareness as  external visual cue. She completed 2x10 reps R/L with CGA-MIN A required for balance- increased challenges noted maintaining single leg balance on R LE and slowed movements when stepping R foot to cone.   Engaged Pt in completing peg board replication activity in standing position to work on R FMC, standing balance, and standing tolerance. CGA required for balance, however Pt only able to complete ~25% of activity in standing d/t Pt fearful of falling and self limiting stating this is too hard, I am going to fall. Provided MAX therapeutic support and education that Pt is in safe position with mat table behind her and the activity is designed to target her standing balance, however she declined to try activity again in standing. She completed task in seated position to support moral. Improved pinch and functional use of R UE noted, however Pt does become easily frustrated at herself when she is not able to complete activities or ADLs as quickly as she would like.   Stand pivot EOM > wc using RW CGA +MIN verbal cues for proximity to RW. Transported Pt back to room total A in wc. Pt was left resting in bed with call bell in reach, bed alarm on, and all needs met.    Therapy Documentation Precautions:  Precautions Precautions: Fall Recall of Precautions/Restrictions: Impaired Restrictions Weight Bearing Restrictions Per Provider Order: No     Therapy/Group: Individual Therapy  Darlene Pugh 07/26/2024, 7:32 AM

## 2024-07-26 NOTE — Progress Notes (Signed)
 Patient ID: Darlene Pugh, female   DOB: 04/01/1961, 63 y.o.   MRN: [id number]  Met with [pt and spoke with Latoya-daughter via telephone to give team conference update with goals of supervision-CGA level and target discharge date of 12/23. Pt is happy to have a discharge date and have scheduled Latoya for education on Friday from 1:00-4:00. Sharene is aware of her Mom's not wanting to participate at times she is set in her ways. Will work on discharge needs and see daughter when here on Friday  Gave pt her daughter's FMLA forms completed by PA. Alos let daughter know of this when here visiting

## 2024-07-26 NOTE — Progress Notes (Signed)
 Physical Therapy Session Note  Patient Details  Name: Darlene Pugh MRN: [id number] Date of Birth: [birth year]  Today's Date: 07/26/2024 PT Individual Time: 1420-1500 PT Individual Time Calculation (min): 40 min   Short Term Goals: Week 1:  PT Short Term Goal 1 (Week 1): Pt will transfer sup to sit w/ CGA consistently w/o bed features. PT Short Term Goal 2 (Week 1): Pt will transfer sit to stand w/ min A consistently. PT Short Term Goal 3 (Week 1): Pt will amb x 25' w/ RW and min A PT Short Term Goal 4 (Week 1): PT to assess stairs.  Skilled Therapeutic Interventions/Progress Updates: Patient sitting in WC on entrance to room. Patient alert and agreeable to PT session.   Patient reported no pain. Pt provided with encouragement and overall discussion on being pt's own advocate and pushing self to best ability to improve overall functional independence.   Therapeutic Activity: Transfers: Pt performed sit<>stand transfers throughout session with CGA and VC for hand placement and to control descent.  - Pt navigated 8 (6) steps, then another 4 steps after seated rest. Pt with CGA to ascend, and minA descending with cue to increase step width on R LE descending (target cue to avoid stepping on PTA foot). Pt also performed reciprocal pattern on final round. Pt also cued throughout to maintain R UE to side of pt vs leaving behind. Pt progressed to CGA descending.   Neuromuscular Re-ed: NMR facilitated during session with focus on neuromuscular control of R knee extensors. - Eccentric control of R quadriceps and 5lb ankle weight donned while sitting (LAQ) - Eccentric control while standing of knee extensors (pt required few attempts to understand mechanics of eccentric) - Pt ambulated roughly 40' in RW with CGA and WC follow and cue to increase R heel-strike/length and to maintain soft bend in R knee due to genu recurvatum being present (pt performed this ambulatory intervention  following eccentric intervention). Pt with improved R knee soft bend for few steps.   NMR performed for improvements in motor control and coordination, balance, sequencing, judgement, and self confidence/ efficacy in performing all aspects of mobility at highest level of independence.   Patient sitting in WC at end of session with brakes locked, belt alarm set, and all needs within reach.      Therapy Documentation Precautions:  Precautions Precautions: Fall Recall of Precautions/Restrictions: Impaired Restrictions Weight Bearing Restrictions Per Provider Order: No  Therapy/Group: Individual Therapy  Fotini Lemus PTA 07/26/2024, 3:10 PM

## 2024-07-27 LAB — GLUCOSE, CAPILLARY
Glucose-Capillary: 100 mg/dL — ABNORMAL HIGH (ref 70–99)
Glucose-Capillary: 118 mg/dL — ABNORMAL HIGH (ref 70–99)
Glucose-Capillary: 150 mg/dL — ABNORMAL HIGH (ref 70–99)
Glucose-Capillary: 129 mg/dL — ABNORMAL HIGH (ref 70–99)

## 2024-07-27 NOTE — Progress Notes (Signed)
 Physical Therapy Session Note  Patient Details  Name: Darlene Pugh MRN: [id number] Date of Birth: Mar 16, 1961  Today's Date: 07/27/2024 PT Individual Time: 9151-9055 PT Individual Time Calculation (min): 56 min   Today's Date: 07/27/2024 PT Individual Time: 1118-1200 PT Individual Time Calculation (min): 42 min   Short Term Goals: Week 1:  PT Short Term Goal 1 (Week 1): Pt will transfer sup to sit w/ CGA consistently w/o bed features. PT Short Term Goal 2 (Week 1): Pt will transfer sit to stand w/ min A consistently. PT Short Term Goal 3 (Week 1): Pt will amb x 25' w/ RW and min A PT Short Term Goal 4 (Week 1): PT to assess stairs.  Skilled Therapeutic Interventions/Progress Updates:     1st Session: Pt received reclined sideways across bed. Breakfast in front of her and uneaten. Pt states she is not hungry. No complaint of pain and agrees to therapy. PT provides minA for supine to sit with cues for safe positioning at EOB. Pt dons underwear and clean pants while seated EOB, with PT providing minA for trunk stability upon sit to stand to pull lower body clothing up. Following brief rest break, pt transfers to [facility] with minA to guide hips, as pt has decreased control when sitting back into WC.   WC transport to gym. Pt performs stand step to mat table with minA and similar cues. Pt perform balance training, with mirror for visual feedback. Pt performs sit to stand without AD with CGA and cues for sequencing. Pt performs lateral weight shifting progressing to marching in place with minA and cues for engagement of Rt hip extensors and abductors. Pt noted to use Lt hand on mat to help stabilize herself.   Pt then tasked with removing squigz from mirror with RUE to challenge balance and coordination, as well as attention. Pt completes with CGA and increased time required, but does not have any LOBs or require physical assistance to remove squigz.  Pt challenged to ambulate without AD to  increase intensity of gait training and WB through RLE. Pt ambulates x30' without AD, with PT providing modA at trunk and hips, with manual facilitation of Rt hip abductor activation. Following rest break, pt ambulates additional x20' back to [facility] with no AD and same assistance and cues. WC transport back to room. Left seated with all needs within reach.  2nd Session: Pt received seated in [facility] and agrees to therapy. No complaint of pain. WC transport to gym. Pt completes alternating LAQs x20 to warm up knee extensors. PT provides verbal and tactile cues for NM feedback and correct performance.  Pt stands with CGA and ambulates x75' with RW and minA, with cues to increase stance time on RLE and limit tendency to rush Lt stride. Pt also noted to have decreased control of Rt knee extension, snapping into (hyper)extension with majority of steps. PT provides verbal and tactile cues to limit gait deviations. Following extended seated rest break, pt ambulates additional x80' with similar assistance and cues. WC transport back to room. Left seated with all needs within reach.    Therapy Documentation Precautions:  Precautions Precautions: Fall Recall of Precautions/Restrictions: Impaired Restrictions Weight Bearing Restrictions Per Provider Order: No    Therapy/Group: Individual Therapy  Elsie JAYSON Dawn, PT, DPT 07/27/2024, 4:29 PM

## 2024-07-27 NOTE — Group Note (Deleted)
 Patient Details Name: Shatera Rennert MRN: [id number] DOB: 12-Aug-1960 Today's Date: 07/27/2024  Time Calculation:        Group Description: Dance Group: Pt participated in dance group with an emphasis on social interaction, motor planning, increasing overall activity tolerance and bimanual tasks. All songs were selected by group members. Dance moves included AROM of BUE/BLE gross motor movements with an emphasis on building functional endurance.   Individual level documentation: Patient completed group from {sitting/standing:28286} level. Patientt needed {assistance:28279} to complete various dance moves with cues for ***.  Patient needed *** modifications during group.  Pain:    Precautions:    Katheryn SHAUNNA Mines 07/27/2024, 3:13 PM

## 2024-07-27 NOTE — Progress Notes (Signed)
 Occupational Therapy Weekly Progress Note  Patient Details  Name: Sarh Pugh MRN: [id number] Date of Birth: 1960-11-30  Beginning of progress report period: July 20, 2024 End of progress report period: July 27, 2024  Session 1:  Today's Date: 07/27/2024 OT Individual Time: 8983-8955 OT Individual Time Calculation (min): 28 min   Session 2:  Today's Date: 07/27/2024 OT Individual Time: 1347-1500 OT Individual Time Calculation (min): 73 min   Patient has met 5 of 5 short term goals. Ms. Darlene Pugh is making steady progress towards reaching her LTGs. She completing UB ADLs SUP, LB ADLs CGA-MIN A, toileting MIN A, and ambulatory toilet/shower transfers using RW with CGA-MIN A. She is demonstrating improved functional use of her R UE, however she becomes frustrated when using her R arm in ADLs d/t unable to complete activities as quickly as she prefers. She is limited by poor frustration tolerance, some self-limiting behaviors, decreased family support, and fatigue.   Patient continues to demonstrate the following deficits: muscle weakness, decreased cardiorespiratoy endurance, abnormal tone, unbalanced muscle activation, decreased coordination, and decreased motor planning, decreased attention, decreased awareness, decreased problem solving, and decreased safety awareness, central origin, and decreased sitting balance, decreased standing balance, hemiplegia, and decreased balance strategies and therefore will continue to benefit from skilled OT intervention to enhance overall performance with BADL, iADL, and Reduce care partner burden.  Patient progressing toward long term goals..  Continue plan of care.  OT Short Term Goals Week 1:  OT Short Term Goal 1 (Week 1): Pt will recall hemi-dressing techniques with MIN questioning cues OT Short Term Goal 1 - Progress (Week 1): Met OT Short Term Goal 2 (Week 1): Pt will complete toilet transfers with MIN A consistently using LRAD OT  Short Term Goal 2 - Progress (Week 1): Met OT Short Term Goal 3 (Week 1): Pt will complete toileting with MIN A using LRAD OT Short Term Goal 3 - Progress (Week 1): Met OT Short Term Goal 4 (Week 1): Pt will utilize R UE at a gross assist level with SUP OT Short Term Goal 4 - Progress (Week 1): Met OT Short Term Goal 5 (Week 1): Pt will complete LB dressing with MOD A OT Short Term Goal 5 - Progress (Week 1): Met Week 2:  OT Short Term Goal 1 (Week 2): STG=LTG d/t ELOS  Skilled Therapeutic Interventions/Progress Updates:     AM Session:  Pt received sitting up in wc with RN present in room providing AM medications and pain medication. Pt presenting with flat affect requiring consistent therapeutic support to facilitate participation in skilled OT session. Pt reporting 4/10 pain in L knee- OT offering intermittent rest breaks, repositioning, and therapeutic support to optimize participation in therapy session. Focused this session on functional transfers and endurance training. Transported Pt total A to therapy gym in wc for time management and energy conservation. She completed short distance functional mobility to/from NuStep this session using RW with CGA-MIN A required for balance and RW management +MOD verbal cues for proximity to RW, trunk position, and safety. Engaged Pt in completing endurance training on NuStep to increased activity tolerance for ADLs and to work on B U/LE reciprocal movements. She was able to complete 2 bouts of 5 min on level 2 setting at [facility] ~25 with rest breaks provided between and following bouts. Transported Pt back to room total A in wc for energy conservation. Pt was left resting in wc with call bell in reach, seatbelt alarm on, and all needs  met.    PM Session:  Pt received resting in wc presenting to be tired, however in good spirits receptive to skilled OT session reporting 0/10 pain- OT offering intermittent rest breaks, repositioning, and therapeutic support to  optimize participation in therapy session. Transported Pt total A to therapy gym in wc for energy conservation and time management. Pt participated in dance-based seated exercises with an emphasis on motor planning, increasing overall activity tolerance and bimanual tasks. Dance moves included AROM of BUE/BLE gross motor movements with an emphasis on building functional endurance. Songs included Pt preferred music. She completed dance exercises from seated level with OT providing visual model and MOD tactile cues for R U/LE positioning. She demo's slowed R U/LE movements, however improved coordination and motivation to utilize r hemibody during exercises noted. Transported Pt back to room total A in wc for energy conservation. She completed stand pivot using RW with CGA +MIN verbal cues for proximity to RW. EOB > supine SUP. Pt was left resting in bed with call bell in reach, bed alarm on, and all needs met.    Therapy Documentation Precautions:  Precautions Precautions: Fall Recall of Precautions/Restrictions: Impaired Restrictions Weight Bearing Restrictions Per Provider Order: No   Therapy/Group: Individual Therapy  Katheryn SHAUNNA Mines 07/27/2024, 7:36 AM

## 2024-07-27 NOTE — Plan of Care (Signed)
°  Problem: Consults Goal: RH STROKE PATIENT EDUCATION Description: See Patient Education module for education specifics  Outcome: Progressing Goal: Diabetes Guidelines if Diabetic/Glucose > 140 Description: If diabetic or lab glucose is > 140 mg/dl - Initiate Diabetes/Hyperglycemia Guidelines & Document Interventions  Outcome: Progressing   Problem: RH BOWEL ELIMINATION Goal: RH STG MANAGE BOWEL WITH ASSISTANCE Description: STG Manage Bowel with min Assistance. Outcome: Progressing   Problem: RH BLADDER ELIMINATION Goal: RH STG MANAGE BLADDER WITH ASSISTANCE Description: STG Manage Bladder With min Assistance Outcome: Progressing   Problem: RH SKIN INTEGRITY Goal: RH STG SKIN FREE OF INFECTION/BREAKDOWN Description: Manage skin free of infection with min assistance Outcome: Progressing   Problem: RH SAFETY Goal: RH STG ADHERE TO SAFETY PRECAUTIONS W/ASSISTANCE/DEVICE Description: STG Adhere to Safety Precautions With min Assistance/Device. Outcome: Progressing   Problem: RH PAIN MANAGEMENT Goal: RH STG PAIN MANAGED AT OR BELOW PT'S PAIN GOAL Description: <4 w/ prns Outcome: Progressing   Problem: RH KNOWLEDGE DEFICIT Goal: RH STG INCREASE KNOWLEDGE OF DIABETES Description: Manage increase knowledge diabetes with min assistance from daughter using educational materials provided Outcome: Progressing Goal: RH STG INCREASE KNOWLEDGE OF HYPERTENSION Description: Manage increase knowledge of hypertension   with min assistance from daughter using educational materials provided Outcome: Progressing Goal: RH STG INCREASE KNOWLEGDE OF HYPERLIPIDEMIA Description: Manage increase knowledge of hyperlipidemia  with min assistance from daughter using educational materials provided Outcome: Progressing Goal: RH STG INCREASE KNOWLEDGE OF STROKE PROPHYLAXIS Outcome: Progressing   Problem: Education: Goal: Ability to describe self-care measures that may prevent or decrease complications  (Diabetes Survival Skills Education) will improve Outcome: Progressing Goal: Individualized Educational Video(s) Outcome: Progressing   Problem: Coping: Goal: Ability to adjust to condition or change in health will improve Outcome: Progressing   Problem: Fluid Volume: Goal: Ability to maintain a balanced intake and output will improve Outcome: Progressing   Problem: Health Behavior/Discharge Planning: Goal: Ability to identify and utilize available resources and services will improve Outcome: Progressing Goal: Ability to manage health-related needs will improve Outcome: Progressing   Problem: Metabolic: Goal: Ability to maintain appropriate glucose levels will improve Outcome: Progressing   Problem: Nutritional: Goal: Maintenance of adequate nutrition will improve Outcome: Progressing Goal: Progress toward achieving an optimal weight will improve Outcome: Progressing   Problem: Skin Integrity: Goal: Risk for impaired skin integrity will decrease Outcome: Progressing   Problem: Tissue Perfusion: Goal: Adequacy of tissue perfusion will improve Outcome: Progressing

## 2024-07-28 DIAGNOSIS — F54 Psychological and behavioral factors associated with disorders or diseases classified elsewhere: Secondary | ICD-10-CM

## 2024-07-28 LAB — GLUCOSE, CAPILLARY
Glucose-Capillary: 99 mg/dL (ref 70–99)
Glucose-Capillary: 156 mg/dL — ABNORMAL HIGH (ref 70–99)
Glucose-Capillary: 102 mg/dL — ABNORMAL HIGH (ref 70–99)
Glucose-Capillary: 113 mg/dL — ABNORMAL HIGH (ref 70–99)

## 2024-07-28 NOTE — Progress Notes (Signed)
 "                                                        PROGRESS NOTE   Subjective/Complaints: No new issues   ROS: negative chest pain shortness of breath abd pain nausea vomiting diarrhea or constipation.   Objective:   No results found.   No results for input(s): WBC, HGB, HCT, PLT in the last 72 hours.  No results for input(s): NA, K, CL, CO2, GLUCOSE, BUN, CREATININE, CALCIUM  in the last 72 hours.   Intake/Output Summary (Last 24 hours) at 07/28/2024 0749 Last data filed at 07/28/2024 0557 Gross per 24 hour  Intake 240 ml  Output 600 ml  Net -360 ml        Physical Exam: Vital Signs Blood pressure 100/76, pulse 88, temperature 98.6 F (37 C), temperature source Oral, resp. rate 18, height 5' 4 (1.626 m), weight 74 kg, SpO2 94%.  General: No acute distress, appears comfortable, sitting up in bed Heart: Regular rate and rhythm no rubs murmurs or extra sounds Lungs: Clear to auscultation, breathing unlabored, no rales or wheezes Abdomen: hypoactive bowel sounds, soft, nontender to palpation, nondistended but obese Extremities: No clubbing, cyanosis, or edema Skin: No evidence of breakdown, no evidence of rash over exposed surfaces Psych: flat  PRIOR EXAMS: Neurologic: Cranial nerves II through XII intact, motor strength is 5/5 in left and 3 -/5 right deltoid, bicep, tricep, grip, hip flexor, knee extensors, 5/5 left and 3 -/5 right ankle dorsiflexor and plantar flexor Sensory exam normal sensation to light touch in bilateral upper and lower extremities Cerebellar exam normal finger to nose to finger as well as heel to shin in left upper and lower extremities, right upper extremity testing limited by weakness Musculoskeletal: Full range of motion in all 4 extremities. No joint swelling, no pain with range of motion   Assessment/Plan: 1. Functional deficits which require 3+ hours per day of interdisciplinary therapy in a comprehensive  inpatient rehab setting. Physiatrist is providing close team supervision and 24 hour management of active medical problems listed below. Physiatrist and rehab team continue to assess barriers to discharge/monitor patient progress toward functional and medical goals  Care Tool:  Bathing    Body parts bathed by patient: Right arm, Chest, Abdomen, Front perineal area, Right upper leg, Buttocks, Left upper leg, Face   Body parts bathed by helper: Left arm, Right lower leg, Left lower leg     Bathing assist Assist Level: Moderate Assistance - Patient 50 - 74%     Upper Body Dressing/Undressing Upper body dressing   What is the patient wearing?: Pull over shirt    Upper body assist Assist Level: Moderate Assistance - Patient 50 - 74%    Lower Body Dressing/Undressing Lower body dressing      What is the patient wearing?: Pants     Lower body assist Assist for lower body dressing: Moderate Assistance - Patient 50 - 74%     Toileting Toileting    Toileting assist Assist for toileting: Maximal Assistance - Patient 25 - 49%     Transfers Chair/bed transfer  Transfers assist     Chair/bed transfer assist level: Minimal Assistance - Patient > 75%     Locomotion Ambulation   Ambulation assist      Assist level: Moderate  Assistance - Patient 50 - 74% Assistive device: Walker-rolling Max distance: 11   Walk 10 feet activity   Assist     Assist level: Moderate Assistance - Patient - 50 - 74% Assistive device: Walker-rolling   Walk 50 feet activity   Assist Walk 50 feet with 2 turns activity did not occur: Safety/medical concerns         Walk 150 feet activity   Assist Walk 150 feet activity did not occur: Safety/medical concerns         Walk 10 feet on uneven surface  activity   Assist Walk 10 feet on uneven surfaces activity did not occur: Safety/medical concerns         Wheelchair     Assist Is the patient using a wheelchair?:  Yes Type of Wheelchair: Manual    Wheelchair assist level: Moderate Assistance - Patient 50 - 74% Max wheelchair distance: 30    Wheelchair 50 feet with 2 turns activity    Assist        Assist Level: Maximal Assistance - Patient 25 - 49%   Wheelchair 150 feet activity     Assist      Assist Level: Total Assistance - Patient < 25%   Blood pressure 100/76, pulse 88, temperature 98.6 F (37 C), temperature source Oral, resp. rate 18, height 5' 4 (1.626 m), weight 74 kg, SpO2 94%.  Medical Problem List and Plan: 1. 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   2.  Antithrombotics: -DVT/anticoagulation:  Pharmaceutical: Xarelto  10mg  daily x 30 days for DVT prophylaxis- may d/c if amb distance >100' -antiplatelet therapy: Aspirin  81 mg daily and Plavix  75 mg daily x 3 weeks then Plavix  alone-confirmed with IM team  3. Pain Management: Voltaren  gel 2 g 4 times daily, lidocaine  patches, tylenol  PRN  4. Mood/Behavior/Sleep: Provide emotional support             -antipsychotic agents: N/A  5. Neuropsych/cognition: This patient is capable of making decisions on her own behalf.  6. Skin/Wound Care: Routine skin checks  7. Fluids/Electrolytes/Nutrition: Routine in and outs with follow-up chemistries, continue vitamins/supplements. Pepcid  40mg  daily.  8.  Hypertension.  Norvasc  5 mg daily, Cozaar  25 mg daily.  Monitor with increased mobility. Avoid hypotension  -12/12-14 controlled, continue to monitor  Vitals:   07/24/24 1308 07/24/24 1931 07/25/24 0439 07/25/24 1325  BP: 109/75 121/84 104/65 112/72   07/25/24 1930 07/26/24  0449 07/26/24 1411 07/26/24 1924  BP: 112/72 108/77 (!) 93/59 101/64   07/27/24 0410 07/27/24 1318 07/27/24 1944 07/28/24 0557  BP: 128/85 103/72 93/65 100/76   Lowered amlodipine  dose to 2.5mg  dose 12/18, monitor effect for 1-2 more days , if BP remains low can d/c or if pt gets dizzy with standing  9.  Diabetes mellitus.  Hemoglobin A1c 9.5.  Glucophage  XR 500 mg daily, NovoLog  3 units 3 times daily, Lantus  insulin  14 units daily, Farxiga  10 mg daily -12/12 CBGs elevated yesterday, glucerna changed to ensure max, HS SSI was started. CBG  stable this AM -12/13-14/25 CBGs decent, cont regimen CBG (last 3)  Recent Labs    07/27/24 1647 07/27/24 2043 07/28/24 0611  GLUCAP 150* 129* 99    10.  Hyperlipidemia.  Lipitor  80mg  daily, Zetia  10mg  daily  11.  Tobacco abuse as well as history of alcohol.  NicoDerm patch daily-21mg  dose.  Alcohol negative on admission.  12.  Constipation. Increase miralax  to BID, continue senokot 1 tab BID, start sorbitol  PRN  -  13.  L knee pain.  Improved with conservative treatment.  X-ray indicates effusion and OA.  14. Urinary urgency/pressure: -07/22/24 c/o pressure and urgency, had retention and UTI at admission to hospital, treated with 3d of Ceftriaxone  per notes.  Has UTI but cult showed multiple species will recollect Completed keflex  afebrile without dysuria     LOS: 9 days A FACE TO FACE EVALUATION WAS PERFORMED  Darlene Pugh 07/28/2024, 7:49 AM     "

## 2024-07-28 NOTE — Consult Note (Signed)
 Neuropsychological Consultation Comprehensive Inpatient Rehab   Patient:   Darlene Pugh   DOB:   1961-08-02  MR Number:  [id number]  Location:  [facility] [address] CENTER [address] [place] [address] Dept: [phone] Loc: [phone]           Date of Service:   07/28/2024  Start Time:   10 AM End Time:   11 AM  Provider/Observer:  Norleen Asa, Psy.D.       Clinical Neuropsychologist       Billing Code/Service: [phone]  Reason for Service:    Darlene Pugh is a 63 year old female referred for neuropsychological consultation to assist in evaluation regarding current cognitive status as well as mood state and functional status and help with adjustment in coping following a recent CVA.  Patient is currently been admitted to the comprehensive inpatient rehabilitation unit.  Presenting Concerns: Reports feeling tired. Expresses frustration with being in the hospital and the rehabilitation process. Reports difficulty with motor movement on the right side of the body, including the arm and leg. Reports difficulty with mouth movements and speech (dysarthria), which is attributed to muscle weakness on one side of the head affecting the tongue and lips. Denies any changes in memory, attention, or problem-solving abilities.  Relevant Clinical History: - Neurological: History of lacunar strokes. Recent acute infarct on 07/10/2024 involving the left internal capsule and basal ganglia. This resulted in right-sided weakness (facial droop, arm and leg weakness) and dysarthria. CTA also showed moderate to severe stenosis of the ascending cervical segment of the right internal carotid artery and moderate stenosis of the supraclinoid segment. CTA was negative for emergent large vessel occlusion. LVO was negative. Did not receive TNK. History of CVA with residual left facial numbness.  MRI acute infarct involving the  left internal capsule/basal ganglia.  Chronic small vessel ischemic disease with multiple chronic lacunar infarcts. - Psychiatric: History of depression. - Medical: History of hyperlipidemia, hypertension, diabetes mellitus (HbA1c 9.5), and tobacco use. History of alcohol use. Medications include aspirin  81 mg daily, Plavix  75 mg daily (for 3 weeks post-stroke, then Plavix  alone), and Xarelto  10 mg daily for 30 days for VTE prophylaxis. Left knee pain secondary to a fall prior to admission, which is gradually improving. X-ray of the knee showed mild lateral compartmental degeneration arthritis and moderate joint effusion. Diet advanced to regular consistency. A urine culture was positive for yeast (>100,000 colonies) and was treated with a 3-day course of Rocephin .  Functional Capacity: Prior to admission, was independent with activities of daily living and was driving. Lives alone in a one-level home with four steps to entry. Currently requires assistance with mobility and transfers. Therapy evaluations were completed due to decreased functional mobility.  Mental State Examination: - Appearance and Behaviour: Appeared tired. Engaged in the conversation but expressed frustration. - Speech: Rate and volume were within normal limits. Dysarthria noted, attributed to motor weakness rather than a primary language deficit. - Mood and Affect: Reported feeling tired and frustrated. Affect was appropriate to the content of the discussion. - Thought Process/Content: Thought process was clear and coherent. No evidence of disorganisation or psychosis. - Cognition: Oriented to year (2025) and place (hospital). Denies any changes in memory or other cognitive functions. - Insight and Judgement: Demonstrates some understanding of the reason for hospitalisation and the rehabilitation process, though expresses frustration. Judgement appears intact.  Clinical Impressions: The patient is a 63 year old female recovering  from a  recent left basal ganglia/internal capsule stroke, resulting in right-sided motor deficits and dysarthria. She is medically stable and participating in an intensive inpatient rehabilitation program. She demonstrates some insight into her deficits but is experiencing significant frustration with the process. No significant cognitive deficits were reported or observed during this initial screening. The primary focus of the current assessment was psychoeducation regarding the nature of her stroke, the rationale for intensive rehabilitation, and the expected recovery trajectory. A full neuropsychological battery is not indicated at this time.  Recommendations and Next Steps: - Continue with the current intensive inpatient rehabilitation program to maximize motor recovery and develop adaptive skills. - Psychoeducation was provided regarding the nature of her stroke (left-sided, affecting right side of the body), the importance of early and intensive rehabilitation to promote neuroplasticity, and the typical recovery timeline (6-12 months for neurological healing). - Advised to continue with intensive outpatient therapy for the next 6-12 months to maximize long-term functional outcomes. - Encouraged to focus on abilities that are improving and to use adaptive strategies for current limitations.         Electronically Signed   _______________________ Norleen Asa, Psy.D. Clinical Neuropsychologist

## 2024-07-28 NOTE — Plan of Care (Signed)
°  Problem: Consults Goal: RH STROKE PATIENT EDUCATION Description: See Patient Education module for education specifics  Outcome: Progressing Goal: Diabetes Guidelines if Diabetic/Glucose > 140 Description: If diabetic or lab glucose is > 140 mg/dl - Initiate Diabetes/Hyperglycemia Guidelines & Document Interventions  Outcome: Progressing   Problem: RH BOWEL ELIMINATION Goal: RH STG MANAGE BOWEL WITH ASSISTANCE Description: STG Manage Bowel with min Assistance. Outcome: Progressing   Problem: RH BLADDER ELIMINATION Goal: RH STG MANAGE BLADDER WITH ASSISTANCE Description: STG Manage Bladder With min Assistance Outcome: Progressing   Problem: RH SKIN INTEGRITY Goal: RH STG SKIN FREE OF INFECTION/BREAKDOWN Description: Manage skin free of infection with min assistance Outcome: Progressing   Problem: RH SAFETY Goal: RH STG ADHERE TO SAFETY PRECAUTIONS W/ASSISTANCE/DEVICE Description: STG Adhere to Safety Precautions With min Assistance/Device. Outcome: Progressing   Problem: RH PAIN MANAGEMENT Goal: RH STG PAIN MANAGED AT OR BELOW PT'S PAIN GOAL Description: <4 w/ prns Outcome: Progressing   Problem: RH KNOWLEDGE DEFICIT Goal: RH STG INCREASE KNOWLEDGE OF DIABETES Description: Manage increase knowledge diabetes with min assistance from daughter using educational materials provided Outcome: Progressing Goal: RH STG INCREASE KNOWLEDGE OF HYPERTENSION Description: Manage increase knowledge of hypertension   with min assistance from daughter using educational materials provided Outcome: Progressing Goal: RH STG INCREASE KNOWLEGDE OF HYPERLIPIDEMIA Description: Manage increase knowledge of hyperlipidemia  with min assistance from daughter using educational materials provided Outcome: Progressing Goal: RH STG INCREASE KNOWLEDGE OF STROKE PROPHYLAXIS Outcome: Progressing

## 2024-07-28 NOTE — Progress Notes (Signed)
 Physical Therapy Weekly Progress Note  Patient Details  Name: Darlene Pugh MRN: [id number] Date of Birth: 09-10-1960  Beginning of progress report period: July 20, 2024 End of progress report period: July 28, 2024  Today's Date: 07/28/2024 PT Individual Time: 8695-8583 PT Individual Time Calculation (min): 72 min   Patient has met {number 1-5:22450} of {number 1-5:20334} short term goals.  Pt making appropriate progress towards goals and is on track to meet LTG. She has/ has not*** missed a few days of therapy due to ***. She completes bed mobility with ***, sit<>stand transfers with ***, and stand<>pivot transfers with *** using ***. She's ambulating ***ft with *** using ***. She has also shown ability to navigate *** 6-in steps with *** using *** handrails. She continues to be primarily limited by premorbid deconditioning, body habitus, decreased dynamic standing balance, *UE sensory deficits, RUE and RLE weakness, and gait impairments. Caregiver/ family *** has/ have not*** participated in family education to prepare for d/c home.   Patient continues to demonstrate the following deficits muscle weakness, decreased cardiorespiratoy endurance, impaired timing and sequencing, unbalanced muscle activation, decreased coordination, and decreased motor planning, decreased attention to right, decreased awareness, decreased safety awareness, and decreased memory, and decreased standing balance, hemiplegia, and decreased balance strategies and therefore will continue to benefit from skilled PT intervention to increase functional independence with mobility.  Patient progressing toward long term goals..  Continue plan of care.  PT Short Term Goals Week 1:  PT Short Term Goal 1 (Week 1): Pt will transfer sup to sit w/ CGA consistently w/o bed features. PT Short Term Goal 2 (Week 1): Pt will transfer sit to stand w/ min A consistently. PT Short Term Goal 3 (Week 1): Pt will amb x 25' w/  RW and min A PT Short Term Goal 4 (Week 1): PT to assess stairs. Week 2:     Skilled Therapeutic Interventions/Progress Updates:      Therapy Documentation Precautions:  Precautions Precautions: Fall Recall of Precautions/Restrictions: Impaired Restrictions Weight Bearing Restrictions Per Provider Order: No General:   Vital Signs: Therapy Vitals Temp: 98.3 F (36.8 C) Temp Source: Oral Pulse Rate: 100 Resp: 18 BP: 96/74 Patient Position (if appropriate): Sitting Oxygen Therapy SpO2: 99 % O2 Device: Room Air Pain:   Vision/Perception     Mobility:   Locomotion :    Trunk/Postural Assessment :    Balance:   Exercises:   Other Treatments:     Therapy/Group: Individual Therapy  [name] 07/28/2024, 3:25 PM

## 2024-07-28 NOTE — Progress Notes (Signed)
 Occupational Therapy Session Note  Patient Details  Name: Darlene Pugh MRN: [id number] Date of Birth: Aug 28, 1960  Session 1: Today's Date: 07/28/2024 OT Individual Time: 9168-9099 OT Individual Time Calculation (min): 29 min   Session 2: Today's Date: 07/28/2024 OT Individual Time: 8567-8468 OT Individual Time Calculation (min): 59 min   Short Term Goals: Week 2:  OT Short Term Goal 1 (Week 2): STG=LTG d/t ELOS  Session 1: Skilled Therapeutic Interventions/Progress Updates:   Patient agreeable to participate in OT session. Reports no pain level. CHRISTELLA atient participated in skilled OT session focusing on dressing and grooming. Patient received in bed with feet off side of bed. Completed bed mobility SUP with HOB elevated. Completed clothing gathering SU assist. Patient able to complete UB dressing mod I, LB dressing CGA standing from EOB with CGA for balance while standing. Patient then transferred to chair min A with verbal cues for use of AE. Patient then completed functional sit to stands with facilitation of LE to decrease weight shift to stronger side to increase balance and decrease soreness in LE. Patient then provided medication by RN, OT left with RN.    Session 2: Skilled Therapeutic Interventions/Progress Updates:  Completed family training with patients daughters. Therapist educated family regarding patient functional status, patient binder, patient functional transfers. Education also provided on strategies and compensatory techniques to use if patient is fatigue and does not use LE. Education provided for gait belt use and handling technique. Patient daughter completed hands on training for patient tub/ shower transfer and chair transfer with therapist providing VC as needed for technique and form. All education completed and all questions answered. Patient and therapist educated daughters on use of affected UE, needs for dressing, and safety with all mobility. All needs  met, alarm placed on upon leaving.   Therapy Documentation Precautions:  Precautions Precautions: Fall Recall of Precautions/Restrictions: Impaired Restrictions Weight Bearing Restrictions Per Provider Order: No    Therapy/Group: Individual Therapy  D'mariea L Keontay Vora 07/28/2024, 7:27 AM

## 2024-07-28 NOTE — Progress Notes (Addendum)
 Patient ID: Darlene Pugh, female   DOB: 09-15-1960, 63 y.o.   MRN: [id number] Two daughter's here-Latoya and Gordy here for family training. It is going well and can see the assist Mom will require at discharge. Gordy to get fax number so her FMLA forms can be faxed to the [employer]. She has the forms still. Discussed equipment will order wheelchair, 3 in 1 and tub bench via [employer] and they will get the rolling walker themselves. Work toward discharge on Tuesday 12/23.  2;55 PM Daughter got fax number [phone] to fax her FMLA to the [employer]. Have faxed and gave back to daughter with confirmation paper. She will follow up with

## 2024-07-28 NOTE — Progress Notes (Signed)
 Physical Therapy Session Note  Patient Details  Name: Lizzett Nobile MRN: [id number] Date of Birth: 03-Mar-1961  Today's Date: 07/28/2024 PT Individual Time: 1102-1200 PT Individual Time Calculation (min): 58 min   Short Term Goals: Week 1:  PT Short Term Goal 1 (Week 1): Pt will transfer sup to sit w/ CGA consistently w/o bed features. PT Short Term Goal 2 (Week 1): Pt will transfer sit to stand w/ min A consistently. PT Short Term Goal 3 (Week 1): Pt will amb x 25' w/ RW and min A PT Short Term Goal 4 (Week 1): PT to assess stairs. Week 2:     Skilled Therapeutic Interventions/Progress Updates:      Therapy Documentation Precautions:  Precautions Precautions: Fall Recall of Precautions/Restrictions: Impaired Restrictions Weight Bearing Restrictions Per Provider Order: No General:   Vital Signs: Therapy Vitals Temp: 98.3 F (36.8 C) Temp Source: Oral Pulse Rate: 100 Resp: 18 BP: 96/74 Patient Position (if appropriate): Sitting Oxygen Therapy SpO2: 99 % O2 Device: Room Air Pain:   Mobility:   Locomotion :    Trunk/Postural Assessment :    Balance:   Exercises:   Other Treatments:      Therapy/Group: Individual Therapy  [name] PT, DPT, CSRS 07/28/2024, 3:24 PM

## 2024-07-29 LAB — GLUCOSE, CAPILLARY
Glucose-Capillary: 90 mg/dL (ref 70–99)
Glucose-Capillary: 95 mg/dL (ref 70–99)
Glucose-Capillary: 96 mg/dL (ref 70–99)
Glucose-Capillary: 149 mg/dL — ABNORMAL HIGH (ref 70–99)

## 2024-07-29 NOTE — Progress Notes (Signed)
 Speech Language Pathology Daily Session Note  Patient Details  Name: Darlene Pugh MRN: [id number] Date of Birth: [birth year]  Today's Date: 07/29/2024 SLP Individual Time: 8697-8664 SLP Individual Time Calculation (min): 33 min  Short Term Goals: Week 1: SLP Short Term Goal 1 (Week 1): Patient will utilize swallowing compensatory strategies during consumption of least restrictive diet given min verbal A SLP Short Term Goal 2 (Week 1): Patient will demonstrate orientation to situation given min verbal A SLP Short Term Goal 3 (Week 1): Patient will name functional items with 100% accuracy given min multimodal A SLP Short Term Goal 4 (Week 1): Patient will recall daily events given mod multimodal A SLP Short Term Goal 5 (Week 1): Patient will demonstrate functional problem solving skills during daily tasks given mod multimodal A  Skilled Therapeutic Interventions:  Patient was seen in PM to address cognitive training. Pt was alert and seated upright in bed upon SLP arrival. Pt indep oriented to situation. Further discussion completed on upcoming discharge and ways life will be different. Pt warranting min to mod A to identify potential challenges. Pt recalled participation in PT session earlier in the day indep. Pt requesting SLP assistance in correcting meal. Pt noted to have significant difficulty communicating specific information of what she wanted to accomplish. SLP providing up to mod A in order for pt to communicate desire to have fruit cup added to dinner meal. Pt demonstrated semantic paraphasias. She does endorse difficulty with word finding. SLP providing pt a handout with instruction for caregivers to facilitate successful communication exchanges. Direct handoff to nurse at conclusion of session for med pass. SLP to continue POC.   Pain Pain Assessment Pain Scale: 0-10 Pain Score: 0-No pain  Therapy/Group: Individual Therapy  [name] 07/29/2024, 4:01 PM

## 2024-07-29 NOTE — Progress Notes (Signed)
 "                                                        PROGRESS NOTE   Subjective/Complaints:  Pt doing well, slept well, pain doing fine, LBM 2d ago per pt but none documented since 12/15. Urinating fine. No other complaints or concerns.   ROS: negative chest pain shortness of breath abd pain nausea vomiting diarrhea; per HPI   Objective:   No results found.   No results for input(s): WBC, HGB, HCT, PLT in the last 72 hours.  No results for input(s): NA, K, CL, CO2, GLUCOSE, BUN, CREATININE, CALCIUM  in the last 72 hours.   Intake/Output Summary (Last 24 hours) at 07/29/2024 1243 Last data filed at 07/29/2024 0522 Gross per 24 hour  Intake --  Output 700 ml  Net -700 ml        Physical Exam: Vital Signs Blood pressure 108/73, pulse 86, temperature 98.4 F (36.9 C), temperature source Oral, resp. rate 18, height 5' 4 (1.626 m), weight 74 kg, SpO2 98%.  General: No acute distress, appears comfortable, sitting up in bed Heart: Regular rate and rhythm no rubs murmurs or extra sounds Lungs: Clear to auscultation, breathing unlabored, no rales or wheezes Abdomen: somewhat hypoactive bowel sounds, soft, nontender to palpation, nondistended but obese Extremities: No clubbing, cyanosis, or edema Skin: No evidence of breakdown, no evidence of rash over exposed surfaces Psych: flat  PRIOR EXAMS: Neurologic: Cranial nerves II through XII intact, motor strength is 5/5 in left and 3 -/5 right deltoid, bicep, tricep, grip, hip flexor, knee extensors, 5/5 left and 3 -/5 right ankle dorsiflexor and plantar flexor Sensory exam normal sensation to light touch in bilateral upper and lower extremities Cerebellar exam normal finger to nose to finger as well as heel to shin in left upper and lower extremities, right upper extremity testing limited by weakness Musculoskeletal: Full range of motion in all 4 extremities. No joint swelling, no pain with range of  motion   Assessment/Plan: 1. Functional deficits which require 3+ hours per day of interdisciplinary therapy in a comprehensive inpatient rehab setting. Physiatrist is providing close team supervision and 24 hour management of active medical problems listed below. Physiatrist and rehab team continue to assess barriers to discharge/monitor patient progress toward functional and medical goals  Care Tool:  Bathing    Body parts bathed by patient: Right arm, Chest, Abdomen, Front perineal area, Right upper leg, Buttocks, Left upper leg, Face   Body parts bathed by helper: Left arm, Right lower leg, Left lower leg     Bathing assist Assist Level: Moderate Assistance - Patient 50 - 74%     Upper Body Dressing/Undressing Upper body dressing   What is the patient wearing?: Pull over shirt    Upper body assist Assist Level: Moderate Assistance - Patient 50 - 74%    Lower Body Dressing/Undressing Lower body dressing      What is the patient wearing?: Pants     Lower body assist Assist for lower body dressing: Moderate Assistance - Patient 50 - 74%     Toileting Toileting    Toileting assist Assist for toileting: Maximal Assistance - Patient 25 - 49%     Transfers Chair/bed transfer  Transfers assist     Chair/bed transfer assist level: Minimal  Assistance - Patient > 75%     Locomotion Ambulation   Ambulation assist      Assist level: Moderate Assistance - Patient 50 - 74% Assistive device: Walker-rolling Max distance: 11   Walk 10 feet activity   Assist     Assist level: Moderate Assistance - Patient - 50 - 74% Assistive device: Walker-rolling   Walk 50 feet activity   Assist Walk 50 feet with 2 turns activity did not occur: Safety/medical concerns         Walk 150 feet activity   Assist Walk 150 feet activity did not occur: Safety/medical concerns         Walk 10 feet on uneven surface  activity   Assist Walk 10 feet on uneven  surfaces activity did not occur: Safety/medical concerns         Wheelchair     Assist Is the patient using a wheelchair?: Yes Type of Wheelchair: Manual    Wheelchair assist level: Moderate Assistance - Patient 50 - 74% Max wheelchair distance: 30    Wheelchair 50 feet with 2 turns activity    Assist        Assist Level: Maximal Assistance - Patient 25 - 49%   Wheelchair 150 feet activity     Assist      Assist Level: Total Assistance - Patient < 25%   Blood pressure 108/73, pulse 86, temperature 98.4 F (36.9 C), temperature source Oral, resp. rate 18, height 5' 4 (1.626 m), weight 74 kg, SpO2 98%.  Medical Problem List and Plan: 1. 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   2.  Antithrombotics: -DVT/anticoagulation:  Pharmaceutical: Xarelto  10mg  daily x 30 days for DVT prophylaxis- may d/c if amb distance >100' -antiplatelet therapy: Aspirin  81 mg daily and Plavix  75 mg daily x 3 weeks then Plavix  alone-confirmed with IM team  3. Pain Management: Voltaren  gel 2 g 4 times daily, lidocaine  patches, tylenol  PRN  4. Mood/Behavior/Sleep: Provide emotional support             -antipsychotic agents: N/A  5. Neuropsych/cognition: This patient is capable of making decisions on her own behalf.  6. Skin/Wound Care: Routine skin checks  7. Fluids/Electrolytes/Nutrition: Routine in and outs with follow-up chemistries, continue vitamins/supplements. Pepcid  40mg  daily.  8.  Hypertension.  Norvasc  5 mg daily, Cozaar  25 mg daily.  Monitor with increased mobility. Avoid hypotension  -12/12-14 controlled, continue to  monitor   Lowered amlodipine  dose to 2.5mg  dose 12/18, monitor effect for 1-2 more days , if BP remains low can d/c or if pt gets dizzy with standing  -07/29/24 BPs somewhat soft still, if continued by tomorrow, will d/c amlodipine  Vitals:   07/25/24 1325 07/25/24 1930 07/26/24 0449 07/26/24 1411  BP: 112/72 112/72 108/77 (!) 93/59   07/26/24 1924 07/27/24 0410 07/27/24 1318 07/27/24 1944  BP: 101/64 128/85 103/72 93/65   07/28/24 0557 07/28/24 1304 07/28/24 2001 07/29/24 0521  BP: 100/76 96/74 106/68 108/73   9.  Diabetes mellitus.  Hemoglobin A1c  9.5.  Glucophage  XR 500 mg daily, NovoLog  3 units 3 times daily, Lantus  insulin  14 units daily, Farxiga  10 mg daily -12/12 CBGs elevated yesterday, glucerna changed to ensure max, HS SSI was started. CBG stable this AM -07/29/24 CBGs well controlled, cont regimen CBG (last 3)  Recent Labs    07/28/24 2053 07/29/24 0538 07/29/24 1150  GLUCAP 113* 90 95    10.  Hyperlipidemia.  Lipitor  80mg  daily, Zetia  10mg  daily  11.  Tobacco abuse as well as history of alcohol.  NicoDerm patch daily-21mg  dose.  Alcohol negative on admission.  12.  Constipation. Increase miralax  to BID, continue senokot 1 tab BID, start sorbitol  PRN  -12/12 no abd tenderness, LBM 12/10, check abd xray -07/22/24 xray neg, no BM since 12/10 documented but pt states only 2d, added colace PRN but if no BM tomorrow, might need to use sorbitol /PRNs.  -07/23/24 LBM yesterday, cont regimen -07/29/24 LBM 2d ago per pt, none documented since 12/15; per report, 12/17; nursing to encourage pt to try PRNs, if no BM by tomorrow then likely will use sorbitol .  13.  L knee pain.  Improved with conservative treatment.  X-ray indicates effusion and OA.  14. Urinary urgency/pressure: -07/22/24 c/o pressure and urgency, had retention and UTI at admission to hospital, treated with 3d of Ceftriaxone  per notes.  Has UTI but cult showed multiple species will recollect Completed keflex   afebrile without dysuria     LOS: 10 days A FACE TO FACE EVALUATION WAS PERFORMED  [address] 07/29/2024, 12:43 PM     "

## 2024-07-29 NOTE — Plan of Care (Signed)
  Problem: Consults Goal: RH STROKE PATIENT EDUCATION Description: See Patient Education module for education specifics  Outcome: Progressing   Problem: RH BLADDER ELIMINATION Goal: RH STG MANAGE BLADDER WITH ASSISTANCE Description: STG Manage Bladder With min Assistance Outcome: Progressing   Problem: RH SKIN INTEGRITY Goal: RH STG SKIN FREE OF INFECTION/BREAKDOWN Description: Manage skin free of infection with min assistance Outcome: Progressing

## 2024-07-29 NOTE — Progress Notes (Signed)
 Physical Therapy Session Note  Patient Details  Name: Darlene Pugh MRN: [id number] Date of Birth: 24-Apr-1961  Today's Date: 07/29/2024 PT Individual Time: 0802-0916 PT Individual Time Calculation (min): 74 min   Short Term Goals: Week 1:  PT Short Term Goal 1 (Week 1): Pt will transfer sup to sit w/ CGA consistently w/o bed features. PT Short Term Goal 2 (Week 1): Pt will transfer sit to stand w/ min A consistently. PT Short Term Goal 3 (Week 1): Pt will amb x 25' w/ RW and min A PT Short Term Goal 4 (Week 1): PT to assess stairs. Week 2:     Skilled Therapeutic Interventions/Progress Updates:  Patient *** on entrance to room. Patient alert and agreeable to PT session.   Patient with no pain complaint at start of session.  Therapeutic Activity: Bed Mobility: Pt performed supine <> sit with ***. VC/ tc required for ***. Transfers: Pt performed sit<>stand and stand pivot transfers throughout session with ***. Provided vc/ tc for***.  Gait Training:  Pt ambulated *** ft using *** with ***. Demonstrated ***. Provided vc/ tc for ***.  Wheelchair Mobility:  Pt propelled wheelchair *** feet with ***. Provided vc/ tc for ***.  Neuromuscular Re-ed: NMR facilitated during session with focus on ***. Pt guided in ***. NMR performed for improvements in motor control and coordination, balance, sequencing, judgement, and self confidence/ efficacy in performing all aspects of mobility at highest level of independence.   Therapeutic Exercise: Pt performed the following exercises with vc/ tc for proper technique. ***  Patient *** at end of session with brakes locked, *** alarm set, and all needs within reach.  - washed in bed pre pt's choice then relates she doesn't know how to put brief on since someon else always does it.  - got to EOB with time and supervision - dressed while sitting on EOB with setup/ supervision - ambulatd to w/c and then sat at sink to complete hair (uses R  hand!) and then wash teeth in sink.  - taken to Nustep to complete on L2 with BUE and BLE to maintain 40 steps/ min.  - L2 x4min with BLE only and cued to focus on maintaining RLE hip alignment - ambulated back to room.   Therapy Documentation Precautions:  Precautions Precautions: Fall Recall of Precautions/Restrictions: Impaired Restrictions Weight Bearing Restrictions Per Provider Order: No  Pain:  No pain related this session.   Therapy/Group: Individual Therapy  [name] PT, DPT, CSRS 07/29/2024, 1:24 PM

## 2024-07-30 LAB — GLUCOSE, CAPILLARY
Glucose-Capillary: 99 mg/dL (ref 70–99)
Glucose-Capillary: 118 mg/dL — ABNORMAL HIGH (ref 70–99)
Glucose-Capillary: 97 mg/dL (ref 70–99)
Glucose-Capillary: 118 mg/dL — ABNORMAL HIGH (ref 70–99)

## 2024-07-30 MED ORDER — MAGNESIUM HYDROXIDE 400 MG/5ML PO SUSP
15.0000 mL | Freq: Once | ORAL | Status: AC
  Administered 2024-07-30: 15 mL via ORAL
  Filled 2024-07-30: qty 30

## 2024-07-30 NOTE — Plan of Care (Signed)
" °  Problem: RH SKIN INTEGRITY Goal: RH STG SKIN FREE OF INFECTION/BREAKDOWN Description: Manage skin free of infection with min assistance Outcome: Progressing   Problem: RH SAFETY Goal: RH STG ADHERE TO SAFETY PRECAUTIONS W/ASSISTANCE/DEVICE Description: STG Adhere to Safety Precautions With min Assistance/Device. Outcome: Progressing   "

## 2024-07-30 NOTE — Progress Notes (Signed)
 Speech Language Pathology Daily Session Note  Patient Details  Name: Darlene Pugh MRN: [id number] Date of Birth: 16-Dec-1960  Today's Date: 07/30/2024 SLP Individual Time: 9057-8977 SLP Individual Time Calculation (min): 40 min  Short Term Goals: Week 1: SLP Short Term Goal 1 (Week 1): Patient will utilize swallowing compensatory strategies during consumption of least restrictive diet given min verbal A SLP Short Term Goal 2 (Week 1): Patient will demonstrate orientation to situation given min verbal A SLP Short Term Goal 3 (Week 1): Patient will name functional items with 100% accuracy given min multimodal A SLP Short Term Goal 4 (Week 1): Patient will recall daily events given mod multimodal A SLP Short Term Goal 5 (Week 1): Patient will demonstrate functional problem solving skills during daily tasks given mod multimodal A  Skilled Therapeutic Interventions: Pt seen for skilled SLP intervention to address cognitive goals. Pt with flat affect and needed a lot of encouragement to participate in therapy tasks. She identified safety hazards in visual scenes with ~75% accuracy independently and needed max assistance to identify remaining hazards. Attempted multiple verbal sequencing tasks and to facilitate conversational around completing needed tasks once discharged from hospital including cooking, grocery shopping, etc. Pt continued to answer that she can do everything for herself. SLP continued to gently encourage problem solving these tasks post stroke considering resultant mobility challenges. Pt left sitting up in bed with bed alarm set and call bell in reach. RN at bedside to give medications. Continue SLP PoC.   Pain Pain Assessment Pain Scale: 0-10 Pain Score: 0-No pain Pain Location: Knee  Therapy/Group: Individual Therapy  Darlene Pugh 07/30/2024, 10:28 AM

## 2024-07-30 NOTE — Progress Notes (Signed)
 Occupational Therapy Session Note  Patient Details  Name: Darlene Pugh MRN: [id number] Date of Birth: 1961/05/13  Today's Date: 07/30/2024 OT Individual Time: 9854-9769 OT Individual Time Calculation (min): 45 min    Short Term Goals: Week 1:  OT Short Term Goal 1 (Week 1): Pt will recall hemi-dressing techniques with MIN questioning cues OT Short Term Goal 1 - Progress (Week 1): Met OT Short Term Goal 2 (Week 1): Pt will complete toilet transfers with MIN A consistently using LRAD OT Short Term Goal 2 - Progress (Week 1): Met OT Short Term Goal 3 (Week 1): Pt will complete toileting with MIN A using LRAD OT Short Term Goal 3 - Progress (Week 1): Met OT Short Term Goal 4 (Week 1): Pt will utilize R UE at a gross assist level with SUP OT Short Term Goal 4 - Progress (Week 1): Met OT Short Term Goal 5 (Week 1): Pt will complete LB dressing with MOD A OT Short Term Goal 5 - Progress (Week 1): Met  Skilled Therapeutic Interventions/Progress Updates:    Initial Encounter:  Patient in bed at the time of arrival watching television. The pt indicated that she rested during the night with no pain to report at the time of treatment. The pt agreed to work on functional t/f's and UB strength for functional gain with BADL related task performance for a safe return to home.  Functional Transfers:  The pt was able to come from supine in bed to EOB with CGA. The pt was able to come from sit  to stand 4x with CGA to MinA using the RW for coming in to stand.   UB Exercises: The pt was able to sit EOB with her feet support while maintaining upright positioning against external force directed from various planes 4x for a count of 10. The pt required 2 rest breaks. The pt went on to complete a theraband exercise 4x in different planes with 2 rest breaks. The pt completed functional transfers from EOB to w/c LOF and back to EOB with MinA to CGA incorporating the RW. At the end of the session the  patient returned to bed LOF with CGA with the call light and bedside table with in reach, with all additional needs addressed prior to me exiting the room.   Therapy Documentation Precautions:  Precautions Precautions: Fall Recall of Precautions/Restrictions: Impaired Restrictions Weight Bearing Restrictions Per Provider Order: No  Therapy/Group: Individual Therapy  Darlene Pugh 07/30/2024, 4:11 PM

## 2024-07-30 NOTE — Plan of Care (Signed)
" °  Problem: Consults Goal: RH STROKE PATIENT EDUCATION Description: See Patient Education module for education specifics  Outcome: Progressing Goal: Diabetes Guidelines if Diabetic/Glucose > 140 Description: If diabetic or lab glucose is > 140 mg/dl - Initiate Diabetes/Hyperglycemia Guidelines & Document Interventions  Outcome: Progressing   Problem: RH BOWEL ELIMINATION Goal: RH STG MANAGE BOWEL WITH ASSISTANCE Description: STG Manage Bowel with min Assistance. Outcome: Progressing   Problem: RH BLADDER ELIMINATION Goal: RH STG MANAGE BLADDER WITH ASSISTANCE Description: STG Manage Bladder With min Assistance Outcome: Progressing   Problem: RH SKIN INTEGRITY Goal: RH STG SKIN FREE OF INFECTION/BREAKDOWN Description: Manage skin free of infection with min assistance Outcome: Progressing   Problem: RH PAIN MANAGEMENT Goal: RH STG PAIN MANAGED AT OR BELOW PT'S PAIN GOAL Description: <4 w/ prns Outcome: Progressing   Problem: RH KNOWLEDGE DEFICIT Goal: RH STG INCREASE KNOWLEDGE OF DIABETES Description: Manage increase knowledge diabetes with min assistance from daughter using educational materials provided Outcome: Progressing Goal: RH STG INCREASE KNOWLEDGE OF HYPERTENSION Description: Manage increase knowledge of hypertension   with min assistance from daughter using educational materials provided Outcome: Progressing Goal: RH STG INCREASE KNOWLEGDE OF HYPERLIPIDEMIA Description: Manage increase knowledge of hyperlipidemia  with min assistance from daughter using educational materials provided Outcome: Progressing Goal: RH STG INCREASE KNOWLEDGE OF STROKE PROPHYLAXIS Outcome: Progressing   "

## 2024-07-30 NOTE — Progress Notes (Signed)
 "                                                        PROGRESS NOTE   Subjective/Complaints:  Pt doing ok, slept ok, denies pain, pt unsure of LBM but knows it's been a while, none documented since 12/15 but nursing reported 12/17-- agreeable, reluctantly, to take some MoM and did accept miralax  this morning-- says she is always constipated at baseline and this is more than I usually do. Urinating fine. No other complaints or concerns.   ROS: negative chest pain shortness of breath abd pain nausea vomiting diarrhea; per HPI   Objective:   No results found.   No results for input(s): WBC, HGB, HCT, PLT in the last 72 hours.  No results for input(s): NA, K, CL, CO2, GLUCOSE, BUN, CREATININE, CALCIUM  in the last 72 hours.   Intake/Output Summary (Last 24 hours) at 07/30/2024 1224 Last data filed at 07/30/2024 0813 Gross per 24 hour  Intake 840 ml  Output 950 ml  Net -110 ml        Physical Exam: Vital Signs Blood pressure 113/77, pulse 82, temperature 98 F (36.7 C), temperature source Oral, resp. rate 16, height 5' 4 (1.626 m), weight 73.8 kg, SpO2 98%.  General: No acute distress, appears comfortable, sitting up in bed working with SLP Heart: Regular rate and rhythm no rubs murmurs or extra sounds Lungs: Clear to auscultation, breathing unlabored, no rales or wheezes Abdomen: somewhat hypoactive bowel sounds, soft, nontender to palpation, nondistended but obese Extremities: No clubbing, cyanosis, or edema Skin: No evidence of breakdown, no evidence of rash over exposed surfaces Psych: flat  PRIOR EXAMS: Neurologic: Cranial nerves II through XII intact, motor strength is 5/5 in left and 3 -/5 right deltoid, bicep, tricep, grip, hip flexor, knee extensors, 5/5 left and 3 -/5 right ankle dorsiflexor and plantar flexor Sensory exam normal sensation to light touch in bilateral upper and lower extremities Cerebellar exam normal finger to nose to  finger as well as heel to shin in left upper and lower extremities, right upper extremity testing limited by weakness Musculoskeletal: Full range of motion in all 4 extremities. No joint swelling, no pain with range of motion   Assessment/Plan: 1. Functional deficits which require 3+ hours per day of interdisciplinary therapy in a comprehensive inpatient rehab setting. Physiatrist is providing close team supervision and 24 hour management of active medical problems listed below. Physiatrist and rehab team continue to assess barriers to discharge/monitor patient progress toward functional and medical goals  Care Tool:  Bathing    Body parts bathed by patient: Right arm, Chest, Abdomen, Front perineal area, Right upper leg, Buttocks, Left upper leg, Face   Body parts bathed by helper: Left arm, Right lower leg, Left lower leg     Bathing assist Assist Level: Moderate Assistance - Patient 50 - 74%     Upper Body Dressing/Undressing Upper body dressing   What is the patient wearing?: Pull over shirt    Upper body assist Assist Level: Moderate Assistance - Patient 50 - 74%    Lower Body Dressing/Undressing Lower body dressing      What is the patient wearing?: Pants     Lower body assist Assist for lower body dressing: Moderate Assistance - Patient 50 - 74%  Toileting Toileting    Toileting assist Assist for toileting: Maximal Assistance - Patient 25 - 49%     Transfers Chair/bed transfer  Transfers assist     Chair/bed transfer assist level: Minimal Assistance - Patient > 75%     Locomotion Ambulation   Ambulation assist      Assist level: Moderate Assistance - Patient 50 - 74% Assistive device: Walker-rolling Max distance: 11   Walk 10 feet activity   Assist     Assist level: Moderate Assistance - Patient - 50 - 74% Assistive device: Walker-rolling   Walk 50 feet activity   Assist Walk 50 feet with 2 turns activity did not occur:  Safety/medical concerns         Walk 150 feet activity   Assist Walk 150 feet activity did not occur: Safety/medical concerns         Walk 10 feet on uneven surface  activity   Assist Walk 10 feet on uneven surfaces activity did not occur: Safety/medical concerns         Wheelchair     Assist Is the patient using a wheelchair?: Yes Type of Wheelchair: Manual    Wheelchair assist level: Moderate Assistance - Patient 50 - 74% Max wheelchair distance: 30    Wheelchair 50 feet with 2 turns activity    Assist        Assist Level: Maximal Assistance - Patient 25 - 49%   Wheelchair 150 feet activity     Assist      Assist Level: Total Assistance - Patient < 25%   Blood pressure 113/77, pulse 82, temperature 98 F (36.7 C), temperature source Oral, resp. rate 16, height 5' 4 (1.626 m), weight 73.8 kg, SpO2 98%.  Medical Problem List and Plan: 1. 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   2.  Antithrombotics: -DVT/anticoagulation:  Pharmaceutical: Xarelto  10mg  daily x 30 days for DVT prophylaxis- may d/c if amb distance >100' -antiplatelet therapy: Aspirin  81 mg daily and Plavix  75 mg daily x 3 weeks then Plavix  alone-confirmed with IM team  3. Pain Management: Voltaren  gel 2 g 4 times daily, lidocaine  patches, tylenol  PRN  4. Mood/Behavior/Sleep: Provide emotional support             -antipsychotic agents: N/A  5. Neuropsych/cognition: This patient is capable of making decisions on her own behalf.  6. Skin/Wound Care: Routine skin checks  7. Fluids/Electrolytes/Nutrition: Routine in and outs with  follow-up chemistries, continue vitamins/supplements. Pepcid  40mg  daily.  8.  Hypertension.  Norvasc  5 mg daily, Cozaar  25 mg daily.  Monitor with increased mobility. Avoid hypotension  -12/12-14 controlled, continue to monitor   Lowered amlodipine  dose to 2.5mg  dose 12/18, monitor effect for 1-2 more days , if BP remains low can d/c or if pt gets dizzy with standing  -07/29/24 BPs somewhat soft still, if continued by tomorrow, will d/c amlodipine  -07/30/24 BPs actually ok, leave amlodipine  for now, monitor trend. Vitals:   07/26/24 1411 07/26/24 1924 07/27/24 0410 07/27/24 1318  BP: ROLLEN)  93/59 101/64 128/85 103/72   07/27/24 1944 07/28/24 0557 07/28/24 1304 07/28/24 2001  BP: 93/65 100/76 96/74 106/68   07/29/24 0521 07/29/24 1412 07/29/24 2004 07/30/24 0459  BP: 108/73 105/60 115/68 113/77   9.  Diabetes mellitus.  Hemoglobin A1c 9.5.  Glucophage  XR 500 mg daily, NovoLog  3 units 3 times daily, Lantus  insulin  14 units daily, Farxiga  10 mg daily -12/12 CBGs elevated yesterday, glucerna changed to ensure max, HS SSI was started. CBG stable this AM -12/20-21/25 CBGs well controlled, cont regimen CBG (last 3)  Recent Labs    07/29/24 2026 07/30/24 0528 07/30/24 1122  GLUCAP 149* 99 118*    10.  Hyperlipidemia.  Lipitor  80mg  daily, Zetia  10mg  daily  11.  Tobacco abuse as well as history of alcohol.  NicoDerm patch daily-21mg  dose.  Alcohol negative on admission.  12.  Constipation. Increase miralax  to BID, continue senokot 1 tab BID, start sorbitol  PRN  -12/12 no abd tenderness, LBM 12/10, check abd xray -07/22/24 xray neg, no BM since 12/10 documented but pt states only 2d, added colace PRN but if no BM tomorrow, might need to use sorbitol /PRNs.  -07/23/24 LBM yesterday, cont regimen -07/29/24 LBM 2d ago per pt, none documented since 12/15; per report, 12/17; nursing to encourage pt to try PRNs, if no BM by tomorrow then likely will use sorbitol . -07/30/24 still no BM, pt agreeable to  use MoM 15ml but says she's chronically constipated at baseline. Discussed importance of more regular BMs  13.  L knee pain.  Improved with conservative treatment.  X-ray indicates effusion and OA.  14. Urinary urgency/pressure: -07/22/24 c/o pressure and urgency, had retention and UTI at admission to hospital, treated with 3d of Ceftriaxone  per notes.  Has UTI but cult showed multiple species will recollect Completed keflex  afebrile without dysuria  -resolved    LOS: 11 days A FACE TO FACE EVALUATION WAS PERFORMED  [address] 07/30/2024, 12:24 PM     "

## 2024-07-30 NOTE — Progress Notes (Signed)
 Patient refused her nighttime meds. Educated on importance of being compliant with her medication most importantly her stool softener. Last documented BM 12/17. Patient stated I can't help if I could have none.  Will continue to reeducate patient.

## 2024-07-31 ENCOUNTER — Other Ambulatory Visit (HOSPITAL_COMMUNITY): Payer: Self-pay

## 2024-07-31 LAB — CBC
HCT: 30.4 % — ABNORMAL LOW (ref 36.0–46.0)
Hemoglobin: 10.1 g/dL — ABNORMAL LOW (ref 12.0–15.0)
MCH: 31.6 pg (ref 26.0–34.0)
MCHC: 33.2 g/dL (ref 30.0–36.0)
MCV: 95 fL (ref 80.0–100.0)
Platelets: 452 K/uL — ABNORMAL HIGH (ref 150–400)
RBC: 3.2 MIL/uL — ABNORMAL LOW (ref 3.87–5.11)
RDW: 11.8 % (ref 11.5–15.5)
WBC: 7.2 K/uL (ref 4.0–10.5)
nRBC: 0 % (ref 0.0–0.2)

## 2024-07-31 LAB — GLUCOSE, CAPILLARY
Glucose-Capillary: 115 mg/dL — ABNORMAL HIGH (ref 70–99)
Glucose-Capillary: 99 mg/dL (ref 70–99)
Glucose-Capillary: 122 mg/dL — ABNORMAL HIGH (ref 70–99)
Glucose-Capillary: 128 mg/dL — ABNORMAL HIGH (ref 70–99)

## 2024-07-31 LAB — BASIC METABOLIC PANEL WITH GFR
Anion gap: 9 (ref 5–15)
BUN: 17 mg/dL (ref 8–23)
CO2: 29 mmol/L (ref 22–32)
Calcium: 9.3 mg/dL (ref 8.9–10.3)
Chloride: 101 mmol/L (ref 98–111)
Creatinine, Ser: 0.77 mg/dL (ref 0.44–1.00)
GFR, Estimated: >60 mL/min
Glucose, Bld: 84 mg/dL (ref 70–99)
Potassium: 4.1 mmol/L (ref 3.5–5.1)
Sodium: 139 mmol/L (ref 135–145)

## 2024-07-31 MED ORDER — LIDOCAINE 5 % EX PTCH
1.0000 | MEDICATED_PATCH | Freq: Every day | CUTANEOUS | 0 refills | Status: AC
  Filled 2024-07-31: qty 30, 30d supply, fill #0

## 2024-07-31 MED ORDER — THIAMINE HCL 100 MG PO TABS
100.0000 mg | ORAL_TABLET | Freq: Every day | ORAL | 0 refills | Status: DC
  Filled 2024-07-31: qty 30, 30d supply, fill #0

## 2024-07-31 MED ORDER — DAPAGLIFLOZIN PROPANEDIOL 10 MG PO TABS
10.0000 mg | ORAL_TABLET | Freq: Every day | ORAL | 0 refills | Status: DC
  Filled 2024-07-31: qty 30, 30d supply, fill #0

## 2024-07-31 MED ORDER — NICOTINE 21 MG/24HR TD PT24
MEDICATED_PATCH | TRANSDERMAL | 0 refills | Status: DC
  Filled 2024-07-31: qty 7, 7d supply, fill #0

## 2024-07-31 MED ORDER — METFORMIN HCL ER 500 MG PO TB24
500.0000 mg | ORAL_TABLET | Freq: Every day | ORAL | 0 refills | Status: DC
  Filled 2024-07-31: qty 30, 30d supply, fill #0

## 2024-07-31 MED ORDER — RIVAROXABAN 10 MG PO TABS
10.0000 mg | ORAL_TABLET | Freq: Every day | ORAL | 0 refills | Status: DC
  Filled 2024-07-31: qty 16, 16d supply, fill #0

## 2024-07-31 MED ORDER — SENNOSIDES-DOCUSATE SODIUM 8.6-50 MG PO TABS
2.0000 | ORAL_TABLET | Freq: Two times a day (BID) | ORAL | Status: DC
  Administered 2024-07-31: 2 via ORAL
  Filled 2024-07-31 (×2): qty 2

## 2024-07-31 MED ORDER — PANTOPRAZOLE SODIUM 40 MG PO TBEC
40.0000 mg | DELAYED_RELEASE_TABLET | Freq: Every day | ORAL | 0 refills | Status: DC
  Filled 2024-07-31: qty 30, 30d supply, fill #0

## 2024-07-31 MED ORDER — CLOPIDOGREL BISULFATE 75 MG PO TABS
75.0000 mg | ORAL_TABLET | Freq: Every day | ORAL | 0 refills | Status: DC
  Filled 2024-07-31: qty 30, 30d supply, fill #0

## 2024-07-31 MED ORDER — MELATONIN 5 MG PO TABS
5.0000 mg | ORAL_TABLET | Freq: Every evening | ORAL | 0 refills | Status: DC | PRN
  Filled 2024-07-31: qty 30, 30d supply, fill #0

## 2024-07-31 MED ORDER — INSULIN GLARGINE 100 UNIT/ML SOLOSTAR PEN
14.0000 [IU] | PEN_INJECTOR | Freq: Every day | SUBCUTANEOUS | 0 refills | Status: DC
  Filled 2024-07-31: qty 12, 85d supply, fill #0

## 2024-07-31 MED ORDER — LOSARTAN POTASSIUM 25 MG PO TABS
25.0000 mg | ORAL_TABLET | Freq: Every day | ORAL | 0 refills | Status: AC
  Filled 2024-07-31: qty 30, 30d supply, fill #0

## 2024-07-31 MED ORDER — AMLODIPINE BESYLATE 2.5 MG PO TABS
2.5000 mg | ORAL_TABLET | Freq: Every day | ORAL | 0 refills | Status: AC
  Filled 2024-07-31: qty 30, 30d supply, fill #0

## 2024-07-31 MED ORDER — ATORVASTATIN CALCIUM 80 MG PO TABS
80.0000 mg | ORAL_TABLET | Freq: Every day | ORAL | 0 refills | Status: DC
  Filled 2024-07-31: qty 30, 30d supply, fill #0

## 2024-07-31 MED ORDER — NICOTINE 14 MG/24HR TD PT24
MEDICATED_PATCH | TRANSDERMAL | 0 refills | Status: DC
  Filled 2024-07-31: qty 21, 21d supply, fill #0

## 2024-07-31 MED ORDER — EZETIMIBE 10 MG PO TABS
10.0000 mg | ORAL_TABLET | Freq: Every day | ORAL | 0 refills | Status: DC
  Filled 2024-07-31: qty 30, 30d supply, fill #0

## 2024-07-31 MED ORDER — FOLIC ACID 1 MG PO TABS
1.0000 mg | ORAL_TABLET | Freq: Every day | ORAL | 0 refills | Status: DC
  Filled 2024-07-31: qty 30, 30d supply, fill #0

## 2024-07-31 MED ORDER — SORBITOL 70 % SOLN
60.0000 mL | Freq: Once | Status: AC
  Administered 2024-07-31: 60 mL via ORAL
  Filled 2024-07-31: qty 60

## 2024-07-31 MED ORDER — NICOTINE 7 MG/24HR TD PT24
MEDICATED_PATCH | TRANSDERMAL | 0 refills | Status: DC
  Filled 2024-07-31: qty 21, 21d supply, fill #0

## 2024-07-31 MED ORDER — NICOTINE 21 MG/24HR TD PT24: MEDICATED_PATCH | TRANSDERMAL | 0 refills | Status: DC

## 2024-07-31 MED ORDER — DICLOFENAC SODIUM 1 % EX GEL
2.0000 g | Freq: Four times a day (QID) | CUTANEOUS | 0 refills | Status: AC
  Filled 2024-07-31: qty 200, 30d supply, fill #0

## 2024-07-31 NOTE — Progress Notes (Signed)
 Physical Therapy Discharge Summary  Patient Details  Name: Darlene Pugh MRN: [id number] Date of Birth: 02/05/1961  Date of Discharge from PT service:July 31, 2024  Today's Date: 07/31/2024 PT Individual Time: 1418-1530 PT Individual Time Calculation (min): 72 min    Patient has met {NUMBERS 0-12:18577} of {NUMBERS 0-12:18577} long term goals due to {due un:6958322}.  Patient to discharge at [facility] level {LOA:3049010}.   Patient's care partner {care partner:3041650} to provide the necessary {assistance:3041652} assistance at discharge.  Reasons goals not met: ***  Recommendation:  Patient will benefit from ongoing skilled PT services in {setting:3041680} to continue to advance safe functional mobility, address ongoing impairments in ***, and minimize fall risk.  Equipment: {equipment:3041657}  Reasons for discharge: {Reason for discharge:3049018}  Patient/family agrees with progress made and goals achieved: {Pt/Family agree with progress/goals:3049020}  PT Discharge Precautions/Restrictions   Vital Signs   Pain   Pain Interference   Vision/Perception     Cognition   Sensation   Motor     Mobility   Locomotion     Trunk/Postural Assessment     Balance   Extremity Assessment            [name] 07/31/2024, 5:26 PM

## 2024-07-31 NOTE — Progress Notes (Addendum)
 "                                                        PROGRESS NOTE   Subjective/Complaints:  Chronic constipation , no abd pain  Reviewed labs , stable  Discussed CVA risk factors , smoking BP elevation   ROS: negative chest pain shortness of breath abd pain nausea vomiting diarrhea; per HPI   Objective:   No results found.   Recent Labs    07/31/24 0512  WBC 7.2  HGB 10.1*  HCT 30.4*  PLT 452*    Recent Labs    07/31/24 0512  NA 139  K 4.1  CL 101  CO2 29  GLUCOSE 84  BUN 17  CREATININE 0.77  CALCIUM  9.3     Intake/Output Summary (Last 24 hours) at 07/31/2024 0927 Last data filed at 07/31/2024 0744 Gross per 24 hour  Intake 720 ml  Output 1100 ml  Net -380 ml        Physical Exam: Vital Signs Blood pressure 127/69, pulse 75, temperature 98.5 F (36.9 C), temperature source Oral, resp. rate 16, height 5' 4 (1.626 m), weight 73.5 kg, SpO2 97%.  General: No acute distress, appears comfortable, sitting up in bed working with SLP Heart: Regular rate and rhythm no rubs murmurs or extra sounds Lungs: Clear to auscultation, breathing unlabored, no rales or wheezes Abdomen: somewhat hypoactive bowel sounds, soft, nontender to palpation, nondistended but obese Extremities: No clubbing, cyanosis, or edema Skin: No evidence of breakdown, no evidence of rash over exposed surfaces Psych: flat  PRIOR EXAMS: Neurologic: Cranial nerves II through XII intact, motor strength is 5/5 in left and 3 -/5 right deltoid, bicep, tricep, grip, hip flexor, knee extensors, 5/5 left and 3 -/5 right ankle dorsiflexor and plantar flexor Sensory exam normal sensation to light touch in bilateral upper and lower extremities Cerebellar exam normal finger to nose to finger as well as heel to shin in left upper and lower extremities, right upper extremity testing limited by weakness Musculoskeletal: Full range of motion in all 4 extremities. No joint swelling, no pain with range of  motion   Assessment/Plan: 1. Functional deficits which require 3+ hours per day of interdisciplinary therapy in a comprehensive inpatient rehab setting. Physiatrist is providing close team supervision and 24 hour management of active medical problems listed below. Physiatrist and rehab team continue to assess barriers to discharge/monitor patient progress toward functional and medical goals  Care Tool:  Bathing    Body parts bathed by patient: Right arm, Chest, Abdomen, Front perineal area, Right upper leg, Buttocks, Left upper leg, Face, Left arm, Left lower leg, Right lower leg   Body parts bathed by helper: Left arm, Right lower leg, Left lower leg     Bathing assist Assist Level: Contact Guard/Touching assist     Upper Body Dressing/Undressing Upper body dressing   What is the patient wearing?: Pull over shirt    Upper body assist Assist Level: Supervision/Verbal cueing    Lower Body Dressing/Undressing Lower body dressing      What is the patient wearing?: Underwear/pull up, Pants     Lower body assist Assist for lower body dressing: Contact Guard/Touching assist     Toileting Toileting    Toileting assist Assist for toileting: Contact Guard/Touching assist  Transfers Chair/bed transfer  Transfers assist     Chair/bed transfer assist level: Contact Guard/Touching assist     Locomotion Ambulation   Ambulation assist      Assist level: Moderate Assistance - Patient 50 - 74% Assistive device: Walker-rolling Max distance: 11   Walk 10 feet activity   Assist     Assist level: Moderate Assistance - Patient - 50 - 74% Assistive device: Walker-rolling   Walk 50 feet activity   Assist Walk 50 feet with 2 turns activity did not occur: Safety/medical concerns         Walk 150 feet activity   Assist Walk 150 feet activity did not occur: Safety/medical concerns         Walk 10 feet on uneven surface  activity   Assist Walk 10  feet on uneven surfaces activity did not occur: Safety/medical concerns         Wheelchair     Assist Is the patient using a wheelchair?: Yes Type of Wheelchair: Manual    Wheelchair assist level: Moderate Assistance - Patient 50 - 74% Max wheelchair distance: 30    Wheelchair 50 feet with 2 turns activity    Assist        Assist Level: Maximal Assistance - Patient 25 - 49%   Wheelchair 150 feet activity     Assist      Assist Level: Total Assistance - Patient < 25%   Blood pressure 127/69, pulse 75, temperature 98.5 F (36.9 C), temperature source Oral, resp. rate 16, height 5' 4 (1.626 m), weight 73.5 kg, SpO2 97%.  Medical Problem List and Plan: 1. 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  Antithrombotics: -DVT/anticoagulation:  Pharmaceutical: Xarelto  10mg  daily  for DVT prophylaxis- may d/c since amb distance >100' -antiplatelet therapy: Aspirin  81 mg daily and Plavix  75 mg daily x 3 weeks then Plavix  alone-confirmed with IM team  3. Pain Management: Voltaren  gel 2 g 4 times daily, lidocaine  patches, tylenol  PRN  4. Mood/Behavior/Sleep: Provide emotional support             -antipsychotic agents: N/A  5. Neuropsych/cognition: This patient is capable of making decisions on her own behalf.  6. Skin/Wound Care: Routine skin checks  7. Fluids/Electrolytes/Nutrition: Routine in and outs with follow-up chemistries, continue vitamins/supplements. Pepcid  40mg  daily.  8.  Hypertension.  Norvasc  5 mg daily, Cozaar  25 mg daily.  Monitor with increased mobility. Avoid hypotension  -12/12-14 controlled, continue to monitor   Lowered amlodipine  dose to 2.5mg  dose 12/18, monitor effect for 1-2 more days , if BP  remains low can d/c or if pt gets dizzy with standing  -07/29/24 BPs somewhat soft still, if continued by tomorrow, will d/c amlodipine  -07/30/24 BPs actually ok, leave amlodipine  for now, monitor trend. Vitals:   07/27/24 1318 07/27/24 1944 07/28/24 0557 07/28/24 1304  BP: 103/72 93/65 100/76 96/74   07/28/24 2001 07/29/24 0521 07/29/24 1412 07/29/24 2004  BP: 106/68 108/73 105/60 115/68   07/30/24 0459 07/30/24 1333 07/30/24 1920 07/31/24 0414  BP: 113/77 115/83 119/77 127/69   9.  Diabetes mellitus.  Hemoglobin A1c 9.5.  Glucophage  XR 500 mg daily, NovoLog  3 units 3 times daily, Lantus  insulin  14 units daily, Farxiga  10 mg daily -12/12 CBGs elevated yesterday, glucerna changed to ensure max, HS SSI was started. CBG stable this AM -12/22CBGs well controlled, cont regimen CBG (last 3)  Recent Labs    07/30/24 1609 07/30/24 2041 07/31/24 0527  GLUCAP 97 118* 115*    10.  Hyperlipidemia.  Lipitor  80mg  daily, Zetia  10mg  daily  11.  Tobacco abuse as well as history of alcohol.  NicoDerm patch daily-21mg  dose.  Alcohol negative on admission.  12.  Constipation.Chronic  Increase miralax  to BID, continue senokot 1 tab BID, start sorbitol  PRN  -07/29/24 LBM 2d ago per pt, none documented since 12/15; per report, 12/17; nursing to encourage pt to try PRNs, if no BM by tomorrow then likely will use sorbitol . -07/30/24 still no BM, pt agreeable to use MoM 15ml but says she's chronically constipated at baseline. Discussed importance of more regular BMs  13.  L knee pain.  Improved with conservative treatment.  X-ray indicates effusion and OA.  14. Urinary urgency/pressure: -07/22/24 c/o pressure and urgency, had retention and UTI at admission to hospital, treated with 3d of Ceftriaxone  per notes.  Has UTI but cult showed multiple species will recollect Completed keflex  afebrile without dysuria  -resolved  15.  Anemia- on Xarelto  for DVT prophyllaxis amb >100' so will d/c , check stool OB  , recheck CBC in am     Latest Ref Rng & Units 07/31/2024    5:12 AM 07/20/2024    4:41 AM 07/17/2024    9:00 AM  CBC  WBC 4.0 - 10.5 K/uL 7.2  8.3  7.8   Hemoglobin 12.0 - 15.0 g/dL 89.8  84.8  84.5   Hematocrit 36.0 - 46.0 % 30.4  44.5  46.1   Platelets 150 - 400 K/uL 452  378  370      LOS: 12 days A FACE TO FACE EVALUATION WAS PERFORMED  Prentice FORBES Compton 07/31/2024, 9:27 AM     "

## 2024-07-31 NOTE — Progress Notes (Signed)
 Occupational Therapy Discharge Summary  Patient Details  Name: Darlene Pugh MRN: [id number] Date of Birth: 01-Jul-1961  Date of Discharge from OT service:July 31, 2024  Today's Date: 07/31/2024 OT Individual Time: 9269-9159 OT Individual Time Calculation (min): 70 min    Patient has met 12 of 12 long term goals due to improved activity tolerance, improved balance, postural control, ability to compensate for deficits, functional use of  RIGHT upper and RIGHT lower extremity, improved attention, improved awareness, and improved coordination.  Patient to discharge at overall CGA level.  Patient's care partner is independent to provide the necessary physical and cognitive assistance at discharge.    Reasons goals not met: All goals met   Recommendation:  Patient will benefit from ongoing skilled OT services in home health setting to continue to advance functional skills in the area of BADL, iADL, and Reduce care partner burden.  Equipment: TTB & WC  Reasons for discharge: treatment goals met and discharge from hospital  Patient/family agrees with progress made and goals achieved: Yes  OT Discharge Skilled Therapeutic Interventions/Progress Updates:  Pt received sitting up in bed, presenting with flat affect requiring MOD motivation to become receptive to skilled OT session reporting 0/10 pain- OT offering intermittent rest breaks, repositioning, and therapeutic support to optimize participation in therapy session. Pt receptive to taking shower this AM and completing morning routine. She completed ADLs at levels listed below this session, sitting on TTB for majority of shower and standing using grab bars to wash peri-areas. Improved functional use of R UE noted during ADLs utilizing it as a diminished level when giving MAX effort. Following shower, she completed UB dressing in seated position donning [place] shirt SUP and pants CGA +MOD verbal cues required for safety and positioning  in RW when standing to bring pants to waist. She completed grooming/hygiene tasks in seated position at sink for energy conservation using hemi-techniques with MIN A to fasten hair tie around ponytail. Oral care completed MOD I. Spent time at end of session re-educating Pt on CVA recovery process, need for 24/7 SUP at d/c, and fall prevention with Pt receptive to education. Pt was left resting in wc with call bell in reach, chair alarm on, and all needs met.   Precautions/Restrictions  Precautions Precautions: Fall Restrictions Weight Bearing Restrictions Per Provider Order: No Pain Pain Assessment Pain Scale: 0-10 Pain Score: 0-No pain ADL ADL Eating: Modified independent Where Assessed-Eating: Bed level Grooming: Modified independent Where Assessed-Grooming: Sitting at sink Upper Body Bathing: Supervision/safety Where Assessed-Upper Body Bathing: Shower Lower Body Bathing: Contact guard Where Assessed-Lower Body Bathing: Shower Upper Body Dressing: Modified independent (Device) Where Assessed-Upper Body Dressing: Sitting at sink Lower Body Dressing: Contact guard Where Assessed-Lower Body Dressing: Standing at sink, Sitting at sink Toileting: Contact guard Where Assessed-Toileting: Toilet, Bedside Commode Toilet Transfer: Furniture Conservator/restorer Method: Surveyor, Minerals: Dealer Method: Engineer, Technical Sales: Grab bars, Insurance Underwriter: Administrator, Arts Method: Best Boy: Grab bars, Sales Promotion Account Executive Baseline Vision/History: 1 Wears glasses Patient Visual Report: No change from baseline Vision Assessment?: No apparent visual deficits Eye Alignment: Within Functional Limits Ocular Range of Motion: Within Functional Limits Alignment/Gaze Preference: Head turned Tracking/Visual Pursuits: Able to  track stimulus in all quads without difficulty Saccades: Within functional limits Convergence: Within functional limits Visual Fields: No apparent deficits Perception  Perception: Impaired Perception-Other Comments: Mild R inattention with  L Gaze preference, improved from intial eval Praxis Praxis: Impaired Praxis Impairment Details: Motor planning Praxis-Other Comments: Mild deficit, improved from inital eval Cognition Cognition Overall Cognitive Status: Impaired/Different from baseline Arousal/Alertness: Awake/alert Orientation Level: Person;Place;Situation Person: Oriented Place: Oriented Situation: Oriented Memory: Impaired Memory Impairment: Decreased short term memory Decreased Short Term Memory: Verbal basic;Functional basic Attention: Sustained Sustained Attention: Appears intact Awareness: Impaired Awareness Impairment: Anticipatory impairment Problem Solving: Impaired Safety/Judgment: Impaired Brief Interview for Mental Status (BIMS) Repetition of Three Words (First Attempt): 3 Temporal Orientation: Year: Correct Temporal Orientation: Month: Accurate within 5 days Temporal Orientation: Day: Correct Recall: Sock: Yes, no cue required Recall: Blue: Yes, no cue required Recall: Bed: No, could not recall BIMS Summary Score: 13 Sensation Sensation Light Touch: Appears Intact Hot/Cold: Not tested Proprioception: Appears Intact Stereognosis: Not tested Coordination Gross Motor Movements are Fluid and Coordinated: No Fine Motor Movements are Fluid and Coordinated: No Coordination and Movement Description: Decreased coordination on R hemibody d/t hemiplegia Finger Nose Finger Test: Slowed on R, improved from intial eval Motor  Motor Motor: Hemiplegia Motor - Discharge Observations: R hemi with slowed, effortful motor movements Mobility  Bed Mobility Bed Mobility: Rolling Left;Sit to Supine;Left Sidelying to Sit Rolling Left: Independent with assistive  device Left Sidelying to Sit: Independent with assistive device Supine to Sit: Independent with assistive device Sit to Supine: Independent with assistive device Transfers Sit to Stand: Supervision/Verbal cueing Stand to Sit: Supervision/Verbal cueing  Trunk/Postural Assessment  Cervical Assessment Cervical Assessment: Within Functional Limits Thoracic Assessment Thoracic Assessment: Exceptions to [facility] (rounded shoulders) Lumbar Assessment Lumbar Assessment: Exceptions to [facility] (posterior pelvic tilt) Postural Control Postural Control: Deficits on evaluation Righting Reactions: Decreased Protective Responses: Delayed  Balance Balance Balance Assessed: Yes Static Sitting Balance Static Sitting - Balance Support: Feet supported Static Sitting - Level of Assistance: 6: Modified independent (Device/Increase time) Dynamic Sitting Balance Dynamic Sitting - Balance Support: Feet unsupported Dynamic Sitting - Level of Assistance: 5: Stand by assistance Static Standing Balance Static Standing - Balance Support: During functional activity;Bilateral upper extremity supported Static Standing - Level of Assistance: 5: Stand by assistance Dynamic Standing Balance Dynamic Standing - Balance Support: During functional activity;Bilateral upper extremity supported Dynamic Standing - Level of Assistance: 5: Stand by assistance;4: Min assist (CGA) Extremity/Trunk Assessment RUE Assessment RUE Assessment: Exceptions to [facility] Passive Range of Motion (PROM) Comments: WFL Active Range of Motion (AROM) Comments: able to achieve full ROM with MAX effort, slowed movements General Strength Comments: 4-/5 overall LUE Assessment LUE Assessment: Within Functional Limits   Katheryn SHAUNNA Mines 07/31/2024, 8:37 AM

## 2024-07-31 NOTE — Progress Notes (Signed)
 Patient ID: Darlene Pugh, female   DOB: 1961-04-24, 63 y.o.   MRN: [id number] Met with the patient to review medications for discharge to manage secondary risk including HTN, HLD (LDL >400/Trig 435) and DM (A1C 9.5). Discussed recommendation for insulin  and blood glucose monitoring.  Patient stated she had a meter at home but did not consistently check blood glucose level and declined information/education on insulin  administration. Discussed rationale for medication and offered education to daughter if she did not want to administer the medication herself. The patient noted she was going home and take the metformin  she had but she would not be taking insulin  nor would her daughter administer the insulin  shot. Patient given information on insulin , insulin  administration and handouts for [facility] diet recommendations and insulin  care/storage. PAC made aware of patient's declaration. Reviewed HH/CMM diet recommendations given elevated cholesterol levels and stroke. Patient noted that she has never been told this before and her cardiologist told her to continue what you are doing, expressed frustration that she was allowed to eat items in the hospital when receiving insulin  but was encouraged not to eat chips and fried food when she went home. Patient noted hearing the recommendations for medications and life-style/dietary changes to decrease her risk of another stroke but she planned to go home and do as she pleased, she would take care of herself, by herself!SABRA Ronde, Barnie NOVAK

## 2024-07-31 NOTE — Progress Notes (Signed)
 Speech Language Pathology Daily Session Note  Patient Details  Name: Darlene Pugh MRN: [id number] Date of Birth: 03/09/1961  Today's Date: 07/31/2024 SLP Individual Time: 2204-2300 SLP Individual Time Calculation (min): 56 min  Short Term Goals: Week 1: SLP Short Term Goal 1 (Week 1): Patient will utilize swallowing compensatory strategies during consumption of least restrictive diet given min verbal A SLP Short Term Goal 2 (Week 1): Patient will demonstrate orientation to situation given min verbal A SLP Short Term Goal 3 (Week 1): Patient will name functional items with 100% accuracy given min multimodal A SLP Short Term Goal 4 (Week 1): Patient will recall daily events given mod multimodal A SLP Short Term Goal 5 (Week 1): Patient will demonstrate functional problem solving skills during daily tasks given mod multimodal A  Skilled Therapeutic Interventions:  Patient was seen in am to address cognitive re- training and language. Pt was alert and seated upright in WC upon SLP arrival. Pt initially appeared despondent though improved engagement noted as session progressed. SLP continuing to engage pt in functional problem solving through challenging pt to identify solutions to daily living and medical scenarios presented verbally. Pt appropriately identified solutions with min A. Pt continues to present with some reduced awareness of needs though appeared breakthrough occurred as pt confessed feeling of being mad about stroke. Pt responsive to active listening and encouragement. In other minutes of session, SLP addressing word finding. Pt warranting min A for responsive naming task. SLP also engaged pt in a guided conversational exchange given a preferred topic, cooking. Pt with overt instances of anomia warranting min to mod A for strategies of circumlocution or substitution. Reviewed recommendations for continued skilled intervention at discharge with pt verbalizing understanding. Pt  left upright in WC with call button within reach. SLP to sign off at this time   Pain Pain Assessment Pain Scale: 0-10 Pain Score: 0-No pain  Therapy/Group: Individual Therapy  [name] 07/31/2024, 10:05 AM

## 2024-07-31 NOTE — Plan of Care (Signed)
" °  Problem: RH Balance Goal: LTG Patient will maintain dynamic standing with ADLs (OT) Description: LTG:  Patient will maintain dynamic standing balance with assist during activities of daily living (OT)  Outcome: Completed/Met   Problem: Sit to Stand Goal: LTG:  Patient will perform sit to stand in prep for activites of daily living with assistance level (OT) Description: LTG:  Patient will perform sit to stand in prep for activites of daily living with assistance level (OT) Outcome: Completed/Met   Problem: RH Grooming Goal: LTG Patient will perform grooming w/assist,cues/equip (OT) Description: LTG: Patient will perform grooming with assist, with/without cues using equipment (OT) Outcome: Completed/Met   Problem: RH Bathing Goal: LTG Patient will bathe all body parts with assist levels (OT) Description: LTG: Patient will bathe all body parts with assist levels (OT) Outcome: Completed/Met   Problem: RH Dressing Goal: LTG Patient will perform upper body dressing (OT) Description: LTG Patient will perform upper body dressing with assist, with/without cues (OT). Outcome: Completed/Met Goal: LTG Patient will perform lower body dressing w/assist (OT) Description: LTG: Patient will perform lower body dressing with assist, with/without cues in positioning using equipment (OT) Outcome: Completed/Met   Problem: RH Toileting Goal: LTG Patient will perform toileting task (3/3 steps) with assistance level (OT) Description: LTG: Patient will perform toileting task (3/3 steps) with assistance level (OT)  Outcome: Completed/Met   Problem: RH Functional Use of Upper Extremity Goal: LTG Patient will use RT/LT upper extremity as a (OT) Description: LTG: Patient will use right/left upper extremity as a stabilizer/gross assist/diminished/nondominant/dominant level with assist, with/without cues during functional activity (OT) Outcome: Completed/Met   Problem: RH Laundry Goal: LTG Patient will  perform laundry w/assist, cues (OT) Description: LTG: Patient will perform laundry with assistance, with/without cues (OT). Outcome: Completed/Met   Problem: RH Toilet Transfers Goal: LTG Patient will perform toilet transfers w/assist (OT) Description: LTG: Patient will perform toilet transfers with assist, with/without cues using equipment (OT) Outcome: Completed/Met   Problem: RH Tub/Shower Transfers Goal: LTG Patient will perform tub/shower transfers w/assist (OT) Description: LTG: Patient will perform tub/shower transfers with assist, with/without cues using equipment (OT) Outcome: Completed/Met   Problem: RH Awareness Goal: LTG: Patient will demonstrate awareness during functional activites type of (OT) Description: LTG: Patient will demonstrate awareness during functional activites type of (OT) Outcome: Completed/Met   "

## 2024-07-31 NOTE — Progress Notes (Incomplete)
 Inpatient Rehabilitation Care Coordinator Discharge Note   Patient Details  Name: Darlene Pugh MRN: [id number] Date of Birth: [birth year]   Discharge location: HOME WITH FAMILY COMING IN AND OUT AWARE OF 24/7 SUPERVISION/CGA LEVEL  Length of Stay: 13 DAYS  Discharge activity level: SUPERVISION-CGA LEVEL  Home/community participation: ACTIVE  Patient response un:Yzjouy Literacy - How often do you need to have someone help you when you read instructions, pamphlets, or other written material from your doctor or pharmacy?: Never  Patient response un:Dnrpjo Isolation - How often do you feel lonely or isolated from those around you?: Never  Services provided included: MD, RD, PT, OT, SLP, RN, CM, TR, Pharmacy, Neuropsych, SW  Financial Services:  Field Seismologist Utilized: Private Insurance [facility] MEDICAID  Choices offered to/list presented to: PT AND DAUGHTER'S  Follow-up services arranged:  Home Health, DME, Patient/Family has no preference for HH/DME agencies Home Health Agency: [facility]  PT  OT  SP    DME : [facility] WHEELCHAIR, 3 IN1 AND TUB BENCH FAMILY TO GET ROLLING WALKER  PCS REFERRAL MADE FOR AIDE SERVICES-[facility] CM [phone]  Patient response to transportation need: Is the patient able to respond to transportation needs?: Yes In the past 12 months, has lack of transportation kept you from medical appointments or from getting medications?: No In the past 12 months, has lack of transportation kept you from meetings, work, or from getting things needed for daily living?: No   Patient/Family verbalized understanding of follow-up arrangements:  Yes  Individual responsible for coordination of the follow-up plan: LATOYA-DUAHGTER [phone]  Confirmed correct DME delivered: Darlene Pugh 07/31/2024    Comments (or additional information):DAUGHTER'S CAME IN FRIDAY TO DO HANDS ON EDUCATION. AWARE OF CARE NEEDS AND 24/7  CARE  Summary of Stay    Date/Time Discharge Planning CSW  07/26/24 0745 According to family will roatate and be able to provde care at discharge. Pt feels at times will be alone, will need assist. Await team's recommendations and work on dc needs RGD       Olsen Mccutchan, Asberry Pugh

## 2024-07-31 NOTE — Progress Notes (Signed)
 Patient ID: Darlene Pugh, female   DOB: August 07, 1961, 63 y.o.   MRN: [id number] Spoke with Latoya-daughter via telephone to answer her questions regarding tomorrow and process. Will see in am for any last minute questions

## 2024-07-31 NOTE — Plan of Care (Signed)
" °  Problem: RH Swallowing Goal: LTG Patient will consume least restrictive diet using compensatory strategies with assistance (SLP) Description: LTG:  Patient will consume least restrictive diet using compensatory strategies with assistance (SLP) Outcome: Completed/Met Flowsheets (Taken 07/20/2024 1055 by Sockwell, Cassidi F, CCC-SLP) LTG: Pt Patient will consume least restrictive diet using compensatory strategies with assistance of (SLP): Supervision   Problem: RH Cognition - SLP Goal: RH LTG Patient will demonstrate orientation with cues Description:  LTG:  Patient will demonstrate orientation to person/place/time/situation with cues (SLP)   Outcome: Completed/Met Flowsheets (Taken 07/20/2024 1055 by Sockwell, Cassidi F, CCC-SLP) LTG Patient will demonstrate orientation to: Situation LTG: Patient will demonstrate orientation using cueing (SLP): Supervision   Problem: RH Expression Communication Goal: LTG Patient will increase speech intelligibility (SLP) Description: LTG: Patient will increase speech intelligibility at word/phrase/conversation level with cues, % of the time (SLP) Outcome: Completed/Met Flowsheets (Taken 07/20/2024 1055 by Sockwell, Cassidi F, CCC-SLP) LTG: Patient will increase speech intelligibility (SLP): Supervision Level: Conversation level Percent of time patient will use intelligible speech: 95 Goal: LTG Patient will increase word finding of common (SLP) Description: LTG:  Patient will increase word finding of common objects/daily info/abstract thoughts with cues using compensatory strategies (SLP). Outcome: Not Met (add Reason) Flowsheets Taken 07/31/2024 1200 by Lars Joane RAMAN, CCC-SLP LTG: Patient will increase word finding of common (SLP): Minimal Assistance - Patient > 75% Taken 07/20/2024 1055 by Sockwell, Cassidi F, CCC-SLP Patient will use compensatory strategies to increase word finding of: Daily info Note: Slower than anticipated progress toward goal     Problem: RH Problem Solving Goal: LTG Patient will demonstrate problem solving for (SLP) Description: LTG:  Patient will demonstrate problem solving for basic/complex daily situations with cues  (SLP) Outcome: Completed/Met Flowsheets Taken 07/31/2024 1200 by Lars Joane RAMAN, CCC-SLP LTG Patient will demonstrate problem solving for: Minimal Assistance - Patient > 75% Taken 07/20/2024 1055 by Sockwell, Cassidi F, CCC-SLP LTG: Patient will demonstrate problem solving for (SLP): Basic daily situations   Problem: RH Memory Goal: LTG Patient will demonstrate ability for day to day (SLP) Description: LTG:   Patient will demonstrate ability for day to day recall/carryover during cognitive/linguistic activities with assist  (SLP) Outcome: Completed/Met Flowsheets (Taken 07/20/2024 1055 by Sockwell, Cassidi F, CCC-SLP) LTG: Patient will demonstrate ability for day to day recall: New information LTG: Patient will demonstrate ability for day to day recall/carryover during cognitive/linguistic activities with assist (SLP): Minimal Assistance - Patient > 75%   Problem: RH Awareness Goal: LTG: Patient will demonstrate awareness during functional activites type of (SLP) Description: LTG: Patient will demonstrate awareness during functional activites type of (SLP) Outcome: Completed/Met Flowsheets (Taken 07/20/2024 1055 by Sockwell, Cassidi F, CCC-SLP) Patient will demonstrate during cognitive/linguistic activities awareness type of: Intellectual LTG: Patient will demonstrate awareness during cognitive/linguistic activities with assistance of (SLP): Minimal Assistance - Patient > 75%   "

## 2024-07-31 NOTE — Progress Notes (Incomplete)
 Inpatient Rehabilitation Discharge Medication Review by a Pharmacist  A complete drug regimen review was completed for this patient to identify any potential clinically significant medication issues.  High Risk Drug Classes Is patient taking? Indication by Medication  Antipsychotic No   Anticoagulant Yes Xarelto : VTE ppx (30 days)  Antibiotic No   Opioid No   Antiplatelet Yes Plavix : CVA ppx  Hypoglycemics/insulin  Yes Farxiga , Lantus , metformin : DM  Vasoactive Medication Yes Amlodipine , losartan : HTN  Chemotherapy No   Other Yes Tylenol , Voltaren  gel, Lidoderm : pain Zetia , Lipitor : HLD Nitroglycerin : chest pain Nicoderm: smoking cessation Protonix : GERD Miralax : constipation Melatonin: sleep Folic acid , thiamine , MVI: vitamins/supplements     Type of Medication Issue Identified Description of Issue Recommendation(s)  Drug Interaction(s) (clinically significant)     Duplicate Therapy     Allergy     No Medication Administration End Date     Incorrect Dose     Additional Drug Therapy Needed     Significant med changes from prior encounter (inform family/care partners about these prior to discharge).  Restart or discontinue as appropriate. Communicate medication changes with patient/family at discharge  Other       Clinically significant medication issues were identified that warrant physician communication and completion of prescribed/recommended actions by midnight of the next day:  No   Time spent performing this drug regimen review (minutes): 30   Thank you for allowing pharmacy to be a part of this patients care.   Bascom JAYSON Louder, PharmD 07/31/2024 11:21 AM    **Pharmacist phone directory can be found on [email or web address] listed under [facility]**

## 2024-07-31 NOTE — Progress Notes (Signed)
 Speech Language Pathology Discharge Summary  Patient Details  Name: Darlene Pugh MRN: [id number] Date of Birth: 1961-02-26  Date of Discharge from SLP service:July 31, 2024  Today's Date: 07/31/2024 SLP Individual Time: 2204-2300 SLP Individual Time Calculation (min): 56 min  Patient has met 5 of 6 long term goals.  Patient to discharge at overall Min;Supervision level.  Reasons goals not met: slower than anticipated progress   Clinical Impression/Discharge Summary: Pt has made steady gains and has met 5 of 6 STG's this admission due to improved recall, problem solving, speech intelligibility, and swallowing function within current environment. Pt is consuming a regular diet and thin liquids with supervision for use of compensatory strategies. She continues to require between supervision and min A for cognitive tasks including memory and problem solving and min to mod A for word finding.  Pt/ family education complete and pt will discharge home with 24 hour supervision from family. SLP recommending outpatient or home health skilled SLP intervention to maximize cognition, speech intelligibility, language and swallowing in order to maximize her functional independence prior to discharge.  Care Partner:  Caregiver Able to Provide Assistance: Yes  Type of Caregiver Assistance: Cognitive;Physical  Recommendation:  Home Health SLP;Outpatient SLP  Rationale for SLP Follow Up: Maximize functional communication;Maximize cognitive function and independence   Equipment: none   Reasons for discharge: Treatment goals met;Discharged from hospital   Patient/Family Agrees with Progress Made and Goals Achieved: Yes    [name] 07/31/2024, 12:16 PM

## 2024-08-01 ENCOUNTER — Other Ambulatory Visit (HOSPITAL_COMMUNITY): Payer: Self-pay

## 2024-08-01 LAB — GLUCOSE, CAPILLARY: Glucose-Capillary: 86 mg/dL (ref 70–99)

## 2024-08-01 NOTE — Progress Notes (Signed)
 Inpatient Rehabilitation Discharge Medication Review by a Pharmacist  A complete drug regimen review was completed for this patient to identify any potential clinically significant medication issues.  High Risk Drug Classes Is patient taking? Indication by Medication  Antipsychotic No   Anticoagulant Yes Xarelto : VTE ppx (30 days)  Antibiotic No   Opioid No   Antiplatelet Yes Plavix : CVA ppx  Hypoglycemics/insulin  Yes Farxiga , Lantus , metformin : DM  Vasoactive Medication Yes Amlodipine , losartan : HTN  Chemotherapy No   Other Yes Tylenol , Voltaren  gel, Lidoderm : pain Zetia , Lipitor : HLD Nitroglycerin : chest pain Nicoderm: smoking cessation Protonix : GERD Melatonin: sleep     Type of Medication Issue Identified Description of Issue Recommendation(s)  Drug Interaction(s) (clinically significant)     Duplicate Therapy     Allergy     No Medication Administration End Date     Incorrect Dose     Additional Drug Therapy Needed     Significant med changes from prior encounter (inform family/care partners about these prior to discharge).  Restart or discontinue as appropriate. Communicate medication changes with patient/family at discharge  Other       Clinically significant medication issues were identified that warrant physician communication and completion of prescribed/recommended actions by midnight of the next day:  No   Time spent performing this drug regimen review (minutes): 30   Thank you for allowing pharmacy to be a part of this patients care.    Benedetta Heath BS, PharmD, BCPS Clinical Pharmacist 08/01/2024 6:57 AM  Contact: [phone] after 3 PM

## 2024-08-01 NOTE — Plan of Care (Signed)
 " Problem: RH Wheelchair Mobility Goal: LTG Patient will propel w/c in controlled environment (PT) Description: LTG: Patient will propel wheelchair in controlled environment, # of feet with assist (PT) Outcome: Adequate for Discharge Flowsheets (Taken 08/01/2024 1824) LTG: Pt will propel w/c in controlled environ  assist needed:: Minimal Assistance - Patient > 75% LTG: Propel w/c distance in controlled environment: 50 ft Goal: LTG Patient will propel w/c in home environment (PT) Description: LTG: Patient will propel wheelchair in home environment, # of feet with assistance (PT). Outcome: Adequate for Discharge Flowsheets Taken 08/01/2024 1824 by Thaddeus Mliss LABOR, PT LTG: Pt will propel w/c in home environ  assist needed:: Minimal Assistance - Patient > 75% Taken 07/20/2024 1605 by Jakie Reyes SQUIBB, PT LTG: Propel w/c distance in home environment: 50   Problem: RH Balance Goal: LTG Patient will maintain dynamic sitting balance (PT) Description: LTG:  Patient will maintain dynamic sitting balance with assistance during mobility activities (PT) Outcome: Completed/Met Flowsheets (Taken 07/20/2024 1605 by Jakie Reyes SQUIBB, PT) LTG: Pt will maintain dynamic sitting balance during mobility activities with:: Supervision/Verbal cueing Goal: LTG Patient will maintain dynamic standing balance (PT) Description: LTG:  Patient will maintain dynamic standing balance with assistance during mobility activities (PT) Outcome: Completed/Met Flowsheets (Taken 07/20/2024 1605 by Jakie Reyes SQUIBB, PT) LTG: Pt will maintain dynamic standing balance during mobility activities with:: Contact Guard/Touching assist   Problem: Sit to Stand Goal: LTG:  Patient will perform sit to stand with assistance level (PT) Description: LTG:  Patient will perform sit to stand with assistance level (PT) Outcome: Completed/Met Flowsheets (Taken 07/20/2024 1605 by Jakie Reyes SQUIBB, PT) LTG: PT will perform sit to  stand in preparation for functional mobility with assistance level: Supervision/Verbal cueing   Problem: RH Bed Mobility Goal: LTG Patient will perform bed mobility with assist (PT) Description: LTG: Patient will perform bed mobility with assistance, with/without cues (PT). Outcome: Completed/Met Flowsheets (Taken 08/01/2024 1824) LTG: Pt will perform bed mobility with assistance level of: Independent with assistive device    Problem: RH Bed to Chair Transfers Goal: LTG Patient will perform bed/chair transfers w/assist (PT) Description: LTG: Patient will perform bed to chair transfers with assistance (PT). Outcome: Completed/Met Flowsheets (Taken 07/20/2024 1605 by Jakie Reyes SQUIBB, PT) LTG: Pt will perform Bed to Chair Transfers with assistance level: Supervision/Verbal cueing   Problem: RH Car Transfers Goal: LTG Patient will perform car transfers with assist (PT) Description: LTG: Patient will perform car transfers with assistance (PT). Outcome: Completed/Met Flowsheets (Taken 07/20/2024 1605 by Jakie Reyes SQUIBB, PT) LTG: Pt will perform car transfers with assist:: Supervision/Verbal cueing   Problem: RH Furniture Transfers Goal: LTG Patient will perform furniture transfers w/assist (OT/PT) Description: LTG: Patient will perform furniture transfers  with assistance (OT/PT). Outcome: Completed/Met Flowsheets (Taken 07/20/2024 1605 by Jakie Reyes SQUIBB, PT) LTG: Pt will perform furniture transfers with assist:: Supervision/Verbal cueing   Problem: RH Ambulation Goal: LTG Patient will ambulate in controlled environment (PT) Description: LTG: Patient will ambulate in a controlled environment, # of feet with assistance (PT). Outcome: Completed/Met Flowsheets Taken 08/01/2024 1824 by Thaddeus Mliss A, PT LTG: Ambulation distance in controlled environment: 135 ft Taken 07/20/2024 1605 by Jakie Reyes SQUIBB, PT LTG: Pt will ambulate in controlled environ  assist needed::  Contact Guard/Touching assist Goal: LTG Patient will ambulate in home environment (PT) Description: LTG: Patient will ambulate in home environment, # of feet with assistance (PT). Outcome: Completed/Met Flowsheets (Taken 07/20/2024 1605 by Jakie Reyes SQUIBB, PT) LTG: Pt  will ambulate in home environ  assist needed:: Contact Guard/Touching assist LTG: Ambulation distance in home environment: 50   Problem: RH Stairs Goal: LTG Patient will ambulate up and down stairs w/assist (PT) Description: LTG: Patient will ambulate up and down # of stairs with assistance (PT) Outcome: Completed/Met Flowsheets (Taken 07/20/2024 1605 by Jakie Reyes SQUIBB, PT) LTG: Pt will ambulate up/down stairs assist needed:: Contact Guard/Touching assist LTG: Pt will  ambulate up and down number of stairs: 4   "

## 2024-08-16 ENCOUNTER — Encounter: Admitting: Registered Nurse

## 2024-08-17 ENCOUNTER — Encounter: Attending: Physical Medicine & Rehabilitation | Admitting: Physical Medicine & Rehabilitation

## 2024-08-17 ENCOUNTER — Encounter: Payer: Self-pay | Admitting: Physical Medicine & Rehabilitation

## 2024-08-17 VITALS — BP 95/63 | HR 71

## 2024-08-17 DIAGNOSIS — Z794 Long term (current) use of insulin: Secondary | ICD-10-CM

## 2024-08-17 DIAGNOSIS — I10 Essential (primary) hypertension: Secondary | ICD-10-CM | POA: Diagnosis not present

## 2024-08-17 DIAGNOSIS — I639 Cerebral infarction, unspecified: Secondary | ICD-10-CM | POA: Diagnosis not present

## 2024-08-17 DIAGNOSIS — E1169 Type 2 diabetes mellitus with other specified complication: Secondary | ICD-10-CM | POA: Insufficient documentation

## 2024-08-17 NOTE — Progress Notes (Signed)
 "  Subjective:    Patient ID: Darlene Pugh, female    DOB: [birth year], 64 y.o.   MRN: [id number] 63 y.o. right-handed female with history significant for depression, hyperlipidemia, alcohol use, tobacco use, hypertension, diabetes mellitus as well as history of CVA with residual left facial numbness and maintained on aspirin  as well as Brilinta .  Per chart review patient lives alone.  Independent driving prior to admission.  1 level home 4 steps to entry.  Presented 07/10/2024 after being found down on the ground by her property manager with facial droop dysarthria and weakness.  She endorses 2 recent fall without loss of consciousness.  Blood pressure on admission 160/99.  CTA showed no evidence of emergent large vessel occlusion.  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  MRI showed acute infarct involving the left internal capsule/basal ganglia.  Patient did not receive TNK.  LVO negative.  Admission chemistries unremarkable except potassium 3.4 glucose 218 alcohol negative total CK of 172, hemoglobin A1c 9.5.  Echocardiogram ejection fraction of 55 to 60% no wall motion abnormalities.  Neurology follow-up maintained on aspirin  81 mg daily and Plavix  75 mg daily for 3 weeks then Plavix  alone confirmed with neurology services.  She was placed on Xarelto  10 mg daily for 30 days for VTE prophylaxis.  She did have a urine culture greater than 100,000 colony of yeast completing a 3-day course of Rocephin .  Patient reports she had left knee pain due to the fall before admission.  Appears she was treated with Ace wrap as well as ice and Toradol .  X-ray with mild lateral compartment degeneration arthritis and moderate joint effusion.  Therapy evaluations completed due to patient decreased functional mobility was admitted for a comprehensive rehab program.  Admit date: 07/19/2024 Discharge date:  08/01/2024 HPI  Pain Inventory Average Pain 0 Pain Right Now 0 My pain is .  In the last 24 hours, has pain interfered with the following? General activity 0 Relation with others 0 Enjoyment of life 0 What TIME of day is your pain at its worst? varies Sleep (in general) Good  Pain is worse with: . Pain improves with: . Relief from Meds: .  walk without assistance use a cane use a walker how many minutes can you walk? 10 ability to climb steps?  yes do you drive?  no use a wheelchair  disabled: date disabled . I need assistance with the following:  meal prep, household duties, and shopping  weakness trouble walking confusion  Hospital f/u  Hospital f/u    No family history on file. Social History   Socioeconomic History   Marital status: Legally Separated    Spouse name: Not on file   Number of children: Not on file   Years of education: Not on file   Highest education level: Not on file  Occupational History   Not on file  Tobacco Use   Smoking status: Every Day    Types: Cigarettes   Smokeless tobacco: Never  Substance and Sexual Activity   Alcohol use: No   Drug use: No   Sexual activity: Not on file  Other Topics Concern   Not on file  Social History Narrative   Not on file   Social Drivers of Health   Tobacco Use: High Risk (07/19/2024)   Patient History    Smoking Tobacco Use: Every Day    Smokeless Tobacco Use: Never  Passive Exposure: Not on file  Financial Resource Strain: Not on File (11/27/2021)   Received from [employer] [facility]    Financial Resource Strain: 0  Food Insecurity: No Food Insecurity (07/10/2024)   Epic    Worried About Programme Researcher, Broadcasting/film/video in the Last Year: Never true    Ran Out of Food in the Last Year: Never true  Transportation Needs: Unmet Transportation Needs (07/10/2024)   Epic    Lack of Transportation (Medical): Yes    Lack of Transportation (Non-Medical): Yes  Physical Activity: Not on  File (11/27/2021)   Received from [facility]   Physical Activity    Physical Activity: 0  Stress: Not on File (11/27/2021)   Received from [facility]   Stress    Stress: 0  Social Connections: Not on File (05/03/2023)   Received from [facility]   Social Connections    Connectedness: 0  Depression (PHQ2-9): Not on file  Alcohol Screen: Not on file  Housing: Low Risk (07/10/2024)   Epic    Unable to Pay for Housing in the Last Year: No    Number of Times Moved in the Last Year: 0    Homeless in the Last Year: No  Utilities: Not At Risk (07/10/2024)   Epic    Threatened with loss of utilities: No  Health Literacy: Not on file   Past Surgical History:  Procedure Laterality Date   LEFT HEART CATH AND CORONARY ANGIOGRAPHY N/A 08/02/2017   Procedure: LEFT HEART CATH AND CORONARY ANGIOGRAPHY;  Surgeon: Levern Hutching, MD;  Location: [facility] INVASIVE CV LAB;  Service: Cardiovascular;  Laterality: N/A;   Past Medical History:  Diagnosis Date   Diabetes mellitus without complication (HCC)    Hypertension    BP 95/63   Pulse 71   SpO2 96%   Opioid Risk Score:   Fall Risk Score:  `1  Depression screen PHQ 2/9      No data to display            Review of Systems  Musculoskeletal:  Positive for gait problem.  Neurological:  Positive for weakness.  Psychiatric/Behavioral:  Positive for confusion.   All other systems reviewed and are negative.      Objective:   Physical Exam  General no acute distress Mood and affect flat but otherwise appropriate she is able to follow simple commands Speech without dysarthria or aphasia. Motor strength is 3 - in the right deltoid bicep tricep finger flexors and extensors She can oppose finger to thumb to all 4 digits. Finger-nose-finger testing is slower on the right side than on the left but otherwise intact. Motor strength in the right lower extremity is 4/5 in the hip flexor knee extensor ankle dorsiflexor She ambulates with a walker she has  hyperextension of the right knee but no foot drag      Assessment & Plan:   Assessment and Plan Assessment & Plan Functional deficits secondary to left internal capsule, basal ganglia infarction Residual right hemiparesis and mild motor deficits persist post-infarction. Gradual improvement noted with home health therapy. - Continue home health therapy as long as progress is evident and services are available. - Discussed typical duration of home health therapy (approximately two months); consider outpatient therapy if further rehabilitation is needed and transportation is available. - Follow-up in six weeks to reassess functional status and determine need for outpatient therapy.  Type 2 diabetes mellitus Diabetes managed with insulin  and fingerstick glucose monitoring. - Confirmed self-administration of  insulin  and fingerstick glucose monitoring.  Hypertension Blood pressure well controlled with adherence to antihypertensive regimen. - Reviewed blood pressure during visit; confirmed within goal range. - Encouraged continued adherence to prescribed antihypertensive medications.  Have made referral to [facility] health primary care at [facility] since the patient has not been able to contact her usual PCP.  She is called multiple times as has her sister Sister Davi Rotan wishes to be the contact [phone]  I will see the patient back in rehab medicine follow-up 6 weeks "

## 2024-08-17 NOTE — Patient Instructions (Signed)
" °  °  VISIT SUMMARY: You had a follow-up visit to check on your recovery after your stroke. You are doing well with your daily activities and managing your diabetes and blood pressure effectively.  YOUR PLAN: FUNCTIONAL DEFICITS FROM STROKE: You have some remaining weakness on your right side and mild motor deficits, but you are gradually improving with home health therapy. -Continue with home health therapy as long as you are making progress and the services are available. -We discussed that home health therapy typically lasts about two months. If you need more rehabilitation after that, consider outpatient therapy if you have transportation. -Follow up in six weeks to check your progress and decide if you need outpatient therapy.  TYPE 2 DIABETES MELLITUS: Your diabetes is managed with insulin  and blood sugar monitoring. -Continue oral meds and monitor your blood sugar with fingersticks.  HYPERTENSION: Your blood pressure is well controlled with your current medication. -Keep taking your prescribed blood pressure medications as directed. -Your blood pressure readings are within the goal range, so keep up the good work.                      Contains text generated by [employer].                                 Contains text generated by [employer].                        Contains text generated by [employer].                                 Contains text generated by [employer].   "

## 2024-08-25 NOTE — Progress Notes (Signed)
 [facility] Worker Note Stroke Post Discharge Follow-Up  Darlene Pugh [id number]   Contact Type:  Telephone Encounter Date: 08/25/2024  Outreach Project:  Stroke post discharge follow-up Managed Medicaid Plan Participant:   PCP: Yes - See Care Teams in patient chart Payor Status: Does the patient have health insurance (Y/N): Yes Payor Name: : [facility]    Community Health Worker Documentation     Row Name 08/25/24 1058     Post-Stroke CHW Follow-Up Telephone Call   Discharge Date 07/19/24   Discharge Location [facility]   How have you been feeling since being released from the hospital? --  Unable to reach patient to verify   Program RN notified of new or worsening symptoms --  Unable to reach patient to verify   Depression Screening Exception: Other- indicate reason in comment box  Unable to reach patient   PHQ2-9 >4 Program RN notified of new or worsening symptoms. N/A     Post-Discharge Instructions Reviewed   Did the patient receive and understand the discharge instructions provided? --  Unable to reach patient to verify     Follow-Up Appointments Review   PCP Hospital F/U appointment scheduled? Yes   Did the patient keep PCP discharge appointment? Yes  Patient has upcoming PCP appt scheduled 08/31/2024 per chart review   Specialist Hospital F/U appointment scheduled? Yes   Did the patient keep the specialist appointment Yes  Patient has upcoming appt scheduled   Did the patient have referral(s) to PT/OT/SLP post discharge? OT;PT;SLP   Has the patient been given appointments for the referrals? Yes   Did the patient keep their PT/OT/SLP appointment(s)? Yes  Patient completed inpatient rehab which consisted of PT, OT, SLP     Risk Factor Follow-Up   Is the pateint diabetic? Yes  Per chart review problem list   Does the patient have the medications/tools needed to manage their diabetes? --  Unable to reach patient to verify   Does the patient have  hypertension? Yes  Per chart review problem list   Does the patient have the medications/tools needed to manage their hypertension? --  Unable to reach patient to verify   Does patient smoke, use tobacco product, and/or vape?  Yes  Confirmed via smoking SDOH questions dated 08/15/2024   Does the patient want information about smoking and/or tobacco use/vaping cessation? --  Unable to reach patient   Does the patient have nutritional needs/restrictions? Yes   Does the patient have nutritional needs/restrictions? Daibetic Diet  Confirmed via discharge summary note dated 08/01/2024   Does the patient exercise IF recommended by the discharge care team? --  Unable to reach patient to verify   Can the patient verbalize understanding of the actions needed to manage their current condition? --  Unable to reach patient to verify   Can the patient verbalize actions needed to address his/her current risk factors to prevent another stroke?  --  Unable to reach patient to verify   Would the patient like helping finding additional community resources and/or stroke support groups? --  Unable to reach patient to verify       Past Medical History:  Diagnosis Date   Diabetes mellitus without complication (HCC)    Hypertension    Social History   Substance and Sexual Activity  Alcohol Use No   Social History   Substance and Sexual Activity  Drug Use No   Tobacco Use History[1]  SDOH Screenings   Food Insecurity: No Food Insecurity (  07/10/2024)  Housing: Low Risk (07/10/2024)  Transportation Needs: Unmet Transportation Needs (07/10/2024)  Utilities: Not At Risk (07/10/2024)  Financial Resource Strain: Not on File (11/27/2021)   [facility]  Physical Activity: Not on File (11/27/2021)   Received from [facility]  Social Connections: Not on File (05/03/2023)   Received from [facility]  Stress: Not on File (11/27/2021)   Received from [facility]  Tobacco Use: High Risk (08/17/2024)   Referrals (if applicable):         Education:    Limiting Factors:   Unable to reach patient to complete f/u  Follow-up:   Initial  Follow-up Type:   Telephone  Per [employer] protocol additional f/u needed   Lashauna Arpin A Antigone Crowell         [1]  Social History Tobacco Use  Smoking Status Every Day   Types: Cigarettes  Smokeless Tobacco Never

## 2024-08-31 ENCOUNTER — Ambulatory Visit: Payer: Self-pay | Admitting: Family Medicine

## 2024-08-31 ENCOUNTER — Encounter: Payer: Self-pay | Admitting: Family Medicine

## 2024-08-31 VITALS — BP 132/89 | HR 94 | Ht 64.0 in | Wt 156.2 lb

## 2024-08-31 DIAGNOSIS — Z8673 Personal history of transient ischemic attack (TIA), and cerebral infarction without residual deficits: Secondary | ICD-10-CM | POA: Diagnosis not present

## 2024-08-31 DIAGNOSIS — Z7984 Long term (current) use of oral hypoglycemic drugs: Secondary | ICD-10-CM | POA: Diagnosis not present

## 2024-08-31 DIAGNOSIS — Z794 Long term (current) use of insulin: Secondary | ICD-10-CM | POA: Diagnosis not present

## 2024-08-31 DIAGNOSIS — I10 Essential (primary) hypertension: Secondary | ICD-10-CM

## 2024-08-31 DIAGNOSIS — E1169 Type 2 diabetes mellitus with other specified complication: Secondary | ICD-10-CM | POA: Diagnosis not present

## 2024-08-31 DIAGNOSIS — E785 Hyperlipidemia, unspecified: Secondary | ICD-10-CM

## 2024-08-31 NOTE — Progress Notes (Unsigned)
 "  New Patient Office Visit  Subjective    Patient ID: Darlene Pugh, female    DOB: October 24, 1960  Age: 64 y.o. MRN: [id number]  CC:  Chief Complaint  Patient presents with   Establish Care    Pt had stroke 07/19/24 and was unable to schedule an appt with previous provider, so has decided to establish here.  Pt was given instructions from the hospital to take metformin  and insulin , pt reports not taking insulin  because she does not want to give herself a shot     HPI Darlene Pugh presents to establish care. Patient had a stroke 07/19/24. She has been having therapies at home and improving. She also has diabetes and hypertension.    Outpatient Encounter Medications as of 08/31/2024  Medication Sig   acetaminophen  (TYLENOL ) 500 MG tablet Take 1 tablet (500 mg total) by mouth every 6 (six) hours as needed for mild pain (pain score 1-3), fever or headache.   amLODipine  (NORVASC ) 2.5 MG tablet Take 1 tablet (2.5 mg total) by mouth daily.   atorvastatin  (LIPITOR ) 80 MG tablet Take 1 tablet (80 mg total) by mouth daily.   clopidogrel  (PLAVIX ) 75 MG tablet Take 1 tablet (75 mg total) by mouth daily.   dapagliflozin  propanediol (FARXIGA ) 10 MG TABS tablet Take 1 tablet (10 mg total) by mouth daily.   diclofenac  Sodium (VOLTAREN ) 1 % GEL Apply 2 g topically 4 (four) times daily.   ezetimibe  (ZETIA ) 10 MG tablet Take 1 tablet (10 mg total) by mouth daily.   folic acid  (FOLVITE ) 1 MG tablet Take 1 tablet (1 mg total) by mouth daily.   insulin  glargine (LANTUS ) 100 UNIT/ML Solostar Pen Inject 14 Units into the skin daily.   lidocaine  (LIDODERM ) 5 % Place 1 patch onto the skin daily. Remove & Discard patch within 12 hours or as directed by MD   losartan  (COZAAR ) 25 MG tablet Take 1 tablet (25 mg total) by mouth daily.   melatonin 5 MG TABS Take 1 tablet (5 mg total) by mouth at bedtime as needed.   metFORMIN  (GLUCOPHAGE -XR) 500 MG 24 hr tablet Take 1 tablet (500 mg total) by mouth  daily with breakfast.   Multiple Vitamin (MULTIVITAMIN WITH MINERALS) TABS tablet Take 1 tablet by mouth daily.   nicotine  (NICODERM CQ  - DOSED IN MG/24 HOURS) 21 mg/24hr patch Place one 21 mg patch onto the skin daily x 1 week then 14 mg patch daily x 3 weeks then 7 mg patch daily x 3 weeks and stop   nitroGLYCERIN  (NITROSTAT ) 0.4 MG SL tablet Place 1 tablet (0.4 mg total) under the tongue every 5 (five) minutes x 3 doses as needed for chest pain.   pantoprazole  (PROTONIX ) 40 MG tablet Take 1 tablet (40 mg total) by mouth daily.   polyethylene glycol (MIRALAX  / GLYCOLAX ) 17 g packet Take 17 g by mouth daily as needed.   rivaroxaban  (XARELTO ) 10 MG TABS tablet Take 1 tablet (10 mg total) by mouth daily.   thiamine  (VITAMIN B1) 100 MG tablet Take 1 tablet (100 mg total) by mouth daily.   No facility-administered encounter medications on file as of 08/31/2024.    Past Medical History:  Diagnosis Date   Diabetes mellitus without complication (HCC)    Hypertension     Past Surgical History:  Procedure Laterality Date   LEFT HEART CATH AND CORONARY ANGIOGRAPHY N/A 08/02/2017   Procedure: LEFT HEART CATH AND CORONARY ANGIOGRAPHY;  Surgeon: Levern Hutching, MD;  Location:  [facility];  Service: Cardiovascular;  Laterality: N/A;    History reviewed. No pertinent family history.  Social History   Socioeconomic History   Marital status: Legally Separated    Spouse name: Not on file   Number of children: Not on file   Years of education: Not on file   Highest education level: Not on file  Occupational History   Not on file  Tobacco Use   Smoking status: Every Day    Types: Cigarettes   Smokeless tobacco: Never  Substance and Sexual Activity   Alcohol use: No   Drug use: No   Sexual activity: Not on file  Other Topics Concern   Not on file  Social History Narrative   Not on file   Social Drivers of Health   Tobacco Use: High Risk (08/31/2024)   Patient History    Smoking  Tobacco Use: Every Day    Smokeless Tobacco Use: Never    Passive Exposure: Not on file  Financial Resource Strain: Not on File (11/27/2021)   Received from [employer]    Financial Resource Strain: 0  Food Insecurity: No Food Insecurity (08/31/2024)   Epic    Worried About Programme Researcher, Broadcasting/film/video in the Last Year: Never true    Ran Out of Food in the Last Year: Never true  Transportation Needs: No Transportation Needs (08/31/2024)   Epic    Lack of Transportation (Medical): No    Lack of Transportation (Non-Medical): No  Recent Concern: Transportation Needs - Unmet Transportation Needs (07/10/2024)   Epic    Lack of Transportation (Medical): Yes    Lack of Transportation (Non-Medical): Yes  Physical Activity: Not on File (11/27/2021)   Received from [facility]   Physical Activity    Physical Activity: 0  Stress: Not on File (11/27/2021)   Received [facility]   Stress    Stress: 0  Social Connections: Not on File (05/03/2023)   Received from [facility]   Social Connections    Connectedness: 0  Intimate Partner Violence: Not At Risk (08/31/2024)   Epic    Fear of Current or Ex-Partner: No    Emotionally Abused: No    Physically Abused: No    Sexually Abused: No  Depression (PHQ2-9): Medium Risk (08/31/2024)   Depression (PHQ2-9)    PHQ-2 Score: 8  Alcohol Screen: Low Risk (08/31/2024)   Alcohol Screen    Last Alcohol Screening Score (AUDIT): 0  Housing: Low Risk (08/31/2024)   Epic    Unable to Pay for Housing in the Last Year: No    Number of Times Moved in the Last Year: 0    Homeless in the Last Year: No  Utilities: Not At Risk (07/10/2024)   Epic    Threatened with loss of utilities: No  Health Literacy: Inadequate Health Literacy (08/31/2024)   B1300 Health Literacy    Frequency of need for help with medical instructions: Sometimes    Review of Systems  All other systems reviewed and are negative.       Objective   BP 132/89   Pulse 94   Ht 5' 4  (1.626 m)   Wt 156 lb 3.2 oz (70.9 kg)   SpO2 91%   BMI 26.81 kg/m   Physical Exam Vitals and nursing note reviewed.  Constitutional:      General: She is not in acute distress. Cardiovascular:     Rate and Rhythm: Normal rate and regular rhythm.  Pulmonary:     Effort: Pulmonary effort is normal.     Breath sounds: Normal breath sounds.  Abdominal:     Palpations: Abdomen is soft.     Tenderness: There is no abdominal tenderness.  Musculoskeletal:     Comments: Patient utilizing walker for stability  Neurological:     General: No focal deficit present.     Mental Status: She is alert and oriented to person, place, and time.         Assessment & Plan:   1. Essential hypertension (Primary) Appears stable. continue  2. History of CVA (cerebrovascular accident) Improving. Continue therapies  3. Hyperlipidemia, unspecified hyperlipidemia type Continue   4. Type 2 diabetes mellitus with other specified complication, without long-term current use of insulin  (HCC) Elevated A1c. Referral to [facility] for med management.      No follow-ups on file.   Tanda Raguel SQUIBB, MD  "

## 2024-09-01 ENCOUNTER — Encounter: Payer: Self-pay | Admitting: Family Medicine

## 2024-09-05 ENCOUNTER — Other Ambulatory Visit (HOSPITAL_COMMUNITY): Payer: Self-pay

## 2024-09-06 ENCOUNTER — Other Ambulatory Visit (HOSPITAL_COMMUNITY): Payer: Self-pay

## 2024-09-07 ENCOUNTER — Other Ambulatory Visit (HOSPITAL_COMMUNITY): Payer: Self-pay

## 2024-09-11 NOTE — Progress Notes (Unsigned)
 "   S:     No chief complaint on file.  64 y.o. female who presents for diabetes evaluation, education, and management. Patient arrives in okay spirits and presents with walker for assistance. Patient is accompanied by her sister, Daphine, who is helping her get to appointments and manage her medications. Currently has home health visits twice weekly.  Patient was referred and last seen by Primary Care Provider, Dr. Tanda, on 08/31/2024. At last visit, BP appeared stable and A1C elevated (9.5% on 07/10/24).   PMH is significant for MI (2018), CVA (07/10/2024), HTN, dysphagia, T2D, tobacco and alcohol use disorder, hyperlipidemia (2020). Patient reports diabetes is longstanding. Briefly, patient was admitted 12/1 to 07/19/2024 with acute CVA. Neurology consulted and DAPT initiated for 21 days. ASA then discontinued with Plavix  indefinitely. Of note, A1c was 9.5% on admission. She was discharged on Farxiga , Lantus , and metformin . Today, she's doing okay but ran out of medication ~2 weeks ago. She was taking the Farxiga  and metformin  but not Lantus  due to not wanting to inject.   Family/Social History:  Fhx: Heart attack (dad), high blood pressure (sister, mom) Tobacco: Quit smoking after hospitalization. Alcohol: none reported  Current diabetes medications include:  - Farxiga  10 mg daily (not taking - out of refills) - Lantus  14 units daily (not using - does not want to inject any medications) - Metformin  500 mg daily (has an Rx of 1000 mg BID from [facility] that takes daily) Current hypertension medications include:  - Amlodipine  2.5 mg daily (not taking - out of refills) - Losartan  25 mg daily (not taking - out of refills) Current hyperlipidemia medications include:  - Atorvastatin  80 mg daily (not taking - out of refills) - Ezetimibe  10 mg daily (not taking - out of refills) Current ASCVD medications include:  - Clopidogrel  75 mg daily (not taking - out of refills) - Nitroglycerin   PRN  Patient reports adherence to taking all medications as prescribed when she has them in a pill box. Patient currently denies adherence with medications, due to being out of refills on medications.   Do you feel that your medications are working for you? Unable to assess because out of medications. Have you been experiencing any side effects to the medications prescribed? no Do you have any problems obtaining medications due to transportation or finances? No, but ran out of medication refills  Insurance coverage: Medicaid  Patient denies hypoglycemic events.   Reported home fasting glucose: N/A  Reported 2 hour post-meal/random glucose: Once daily checks finger blood glucose (about 170-180 in the afternoon before eating).   Patient denies nocturia (nighttime urination).  Patient denies neuropathy (nerve pain). Patient denies visual changes. Patient reports self foot exams.  Patient denies difficulty swallowing medications.  Patient reported dietary habits: Eats 1 meals/day, biscuit with fruit, no protein, no snacks, food now doesn't taste the same after stroke, water, some juice mixed with water, sweet tea with sugar today  Patient-reported exercise habits: Not assessed  O:  Lab Results  Component Value Date   HGBA1C 9.5 (H) 07/10/2024   Vitals:   09/12/24 1552  BP: 126/80  Pulse: 96   Lipid Panel     Component Value Date/Time   CHOL 277 (H) 07/11/2024 0237   TRIG 435 (H) 07/11/2024 0237   HDL 49 07/11/2024 0237   CHOLHDL 5.7 07/11/2024 0237   VLDL UNABLE TO CALCULATE IF TRIGLYCERIDE OVER 400 mg/dL 87/97/7974 9762   LDLCALC UNABLE TO CALCULATE IF TRIGLYCERIDE OVER 400 mg/dL  07/11/2024 0237   LDLDIRECT 170 (H) 07/11/2024 1045   Clinical Atherosclerotic Cardiovascular Disease (ASCVD): Yes  The ASCVD Risk score (Arnett DK, et al., 2019) failed to calculate for the following reasons:   Risk score cannot be calculated because patient has a medical history suggesting  prior/existing ASCVD   * - Cholesterol units were assumed  Lab Results  Component Value Date   CHOL 277 (H) 07/11/2024   HDL 49 07/11/2024   LDLCALC UNABLE TO CALCULATE IF TRIGLYCERIDE OVER 400 mg/dL 87/97/7974   LDLDIRECT 170 (H) 07/11/2024   TRIG 435 (H) 07/11/2024   CHOLHDL 5.7 07/11/2024   Lab Results  Component Value Date   CREATININE 0.77 07/31/2024   BUN 17 07/31/2024   NA 139 07/31/2024   K 4.1 07/31/2024   CL 101 07/31/2024   CO2 29 07/31/2024   Medications Reviewed Today     Reviewed by Fleeta Tonia Garnette LITTIE, RPH-CPP (Pharmacist) on 09/12/24 at 1652  Med List Status: <None>   Medication Order Taking? Sig Documenting Provider Last Dose Status Informant  acetaminophen  (TYLENOL ) 500 MG tablet [id number]  Take 1 tablet (500 mg total) by mouth every 6 (six) hours as needed for mild pain (pain score 1-3), fever or headache. Myrna Bitters, DO  Active   amLODipine  (NORVASC ) 2.5 MG tablet [id number]  Take 1 tablet (2.5 mg total) by mouth daily. Pegge Toribio PARAS, PA-C  Active   atorvastatin  (LIPITOR ) 80 MG tablet [id number]  Take 1 tablet (80 mg total) by mouth daily. Newlin, Enobong, MD  Active   clopidogrel  (PLAVIX ) 75 MG tablet [id number]  Take 1 tablet (75 mg total) by mouth daily. Newlin, Enobong, MD  Active   Continuous Glucose Receiver (FREESTYLE LIBRE 3 READER) DEVI [id number] Yes Use to check blood sugar continuously. Newlin, Enobong, MD  Active   Continuous Glucose Sensor (FREESTYLE LIBRE 3 PLUS SENSOR) MISC [id number] Yes Change sensor every 15 days. Use to check blood sugar continuously. Newlin, Enobong, MD  Active   dapagliflozin  propanediol (FARXIGA ) 10 MG TABS tablet [id number]  Take 1 tablet (10 mg total) by mouth daily. Newlin, Enobong, MD  Active   diclofenac  Sodium (VOLTAREN ) 1 % GEL [id number]  Apply 2 g topically 4 (four) times daily. Angiulli, Toribio PARAS, PA-C  Active   ezetimibe  (ZETIA ) 10 MG tablet [id number]  Take 1 tablet (10 mg total) by mouth daily. Newlin,  Enobong, MD  Active   folic acid  (FOLVITE ) 1 MG tablet [id number]  Take 1 tablet (1 mg total) by mouth daily. Newlin, Enobong, MD  Active   lidocaine  (LIDODERM ) 5 % [id number]  Place 1 patch onto the skin daily. Remove & Discard patch within 12 hours or as directed by MD Angiulli, Toribio PARAS, PA-C  Active   losartan  (COZAAR ) 25 MG tablet [id number]  Take 1 tablet (25 mg total) by mouth daily. Angiulli, Daniel J, PA-C  Active   melatonin 5 MG TABS [id number]  Take 1 tablet (5 mg total) by mouth at bedtime as needed. Newlin, Enobong, MD  Active   metFORMIN  (GLUCOPHAGE ) 1000 MG tablet [id number] Yes Take 1 tablet (1,000 mg total) by mouth daily. Newlin, Enobong, MD  Active   Multiple Vitamin (MULTIVITAMIN WITH MINERALS) TABS tablet [id number]  Take 1 tablet by mouth daily. Angiulli, Toribio PARAS, PA-C  Active   nitroGLYCERIN  (NITROSTAT ) 0.4 MG SL tablet [id number]  Place 1 tablet (0.4 mg total) under the tongue every 5 (five) minutes x 3 doses as needed for chest pain. Newlin, Enobong, MD  Active   pantoprazole  (PROTONIX ) 40 MG tablet [id number]  Take 1 tablet (40 mg total) by mouth daily. Newlin, Enobong, MD  Active   thiamine  (VITAMIN B1) 100 MG tablet [id number]  Take 1 tablet (100 mg total) by mouth daily. Newlin, Enobong, MD  Active              A/P: Diabetes longstanding currently uncontrolled, ideally A1C target would be < 7%, however, with her recent stroke she is at high risk of fall due to impaired gait. Therefore, medications will be cautiously titrated to avoid hypoglycemia especially with patient's decrease in appetite. Patient is able to verbalize appropriate hypoglycemia management plan. Medication adherence appears suboptimal. Control is suboptimal due to running out of refills. - STOPPED Lantus  as patient reported not taking because they will not inject themselves and sister is also uncomfortable injection. - Restarted Farxiga  10 mg (dapagliflozin ) daily. Counseled on sick day rules. - Continued  metformin  1000 mg daily.  - Patient educated on purpose, proper use, and potential adverse effects of Farxiga .  - Extensively discussed pathophysiology of diabetes, recommended lifestyle interventions, dietary effects on glucose control.  - Counseled on s/sx of and management of hypoglycemia.  - Next A1c anticipated 10/2024.   ASCVD risk - Secondary prevention in patient with diabetes. Last LDL 170 mg/dl is not at goal of <44 mg/dL. High intensity statin indicated. Repeat LDL ordered. - Continued Atorvastatin  80 mg daily.  - Continued Ezetimibe  10 mg daily  Hypertension longstanding currently controlled. Blood pressure goal of <130/80  mmHg. Medication adherence suboptimal due to medications running out of refills. Patient had not taken blood pressure medications in 1-2 weeks, if not longer, and labs (CMP, UACR, and LDL) will be taken today. - HELD losartan  and amlodipine  due to pending labs, controlled BP without medication, and impaired gait that can contribute to fall risk. Will discuss restarting with patient once labs result.  Written patient instructions provided. Patient verbalized understanding of treatment plan.  Total time in face to face counseling 45 minutes.    Follow-up:  Pharmacist visit 3 weeks 10/03/24 PCP clinic visit 01/02/25 Cardiology with Dr. Levern at [facility]  Powell Gallus, PharmD, MPH Pharmacy Resident  Herlene Fleeta Morris, PharmD, BCACP, CPP Clinical Pharmacist [facility] [phone]  "

## 2024-09-12 ENCOUNTER — Ambulatory Visit: Payer: Self-pay | Admitting: Pharmacist

## 2024-09-12 ENCOUNTER — Other Ambulatory Visit: Payer: Self-pay

## 2024-09-12 ENCOUNTER — Encounter: Payer: Self-pay | Admitting: Pharmacist

## 2024-09-12 VITALS — BP 126/80 | HR 96

## 2024-09-12 DIAGNOSIS — I10 Essential (primary) hypertension: Secondary | ICD-10-CM | POA: Diagnosis not present

## 2024-09-12 DIAGNOSIS — Z7984 Long term (current) use of oral hypoglycemic drugs: Secondary | ICD-10-CM | POA: Diagnosis not present

## 2024-09-12 DIAGNOSIS — E1169 Type 2 diabetes mellitus with other specified complication: Secondary | ICD-10-CM

## 2024-09-12 DIAGNOSIS — I639 Cerebral infarction, unspecified: Secondary | ICD-10-CM

## 2024-09-12 MED ORDER — PANTOPRAZOLE SODIUM 40 MG PO TBEC
40.0000 mg | DELAYED_RELEASE_TABLET | Freq: Every day | ORAL | 1 refills | Status: AC
  Filled 2024-09-12: qty 90, 90d supply, fill #0

## 2024-09-12 MED ORDER — FOLIC ACID 1 MG PO TABS
1.0000 mg | ORAL_TABLET | Freq: Every day | ORAL | 1 refills | Status: AC
  Filled 2024-09-12: qty 90, 90d supply, fill #0

## 2024-09-12 MED ORDER — MELATONIN 5 MG PO TABS
5.0000 mg | ORAL_TABLET | Freq: Every evening | ORAL | 1 refills | Status: AC | PRN
  Filled 2024-09-12: qty 90, 90d supply, fill #0

## 2024-09-12 MED ORDER — EZETIMIBE 10 MG PO TABS
10.0000 mg | ORAL_TABLET | Freq: Every day | ORAL | 1 refills | Status: AC
  Filled 2024-09-12: qty 90, 90d supply, fill #0

## 2024-09-12 MED ORDER — THIAMINE HCL 100 MG PO TABS
100.0000 mg | ORAL_TABLET | Freq: Every day | ORAL | 1 refills | Status: AC
  Filled 2024-09-12: qty 90, 90d supply, fill #0

## 2024-09-12 MED ORDER — CLOPIDOGREL BISULFATE 75 MG PO TABS
75.0000 mg | ORAL_TABLET | Freq: Every day | ORAL | 1 refills | Status: AC
  Filled 2024-09-12: qty 90, 90d supply, fill #0

## 2024-09-12 MED ORDER — FREESTYLE LIBRE 3 READER DEVI
0 refills | Status: AC
  Filled 2024-09-12: qty 1, 30d supply, fill #0

## 2024-09-12 MED ORDER — NITROGLYCERIN 0.4 MG SL SUBL
0.4000 mg | SUBLINGUAL_TABLET | SUBLINGUAL | 12 refills | Status: AC | PRN
  Filled 2024-09-12: qty 25, 30d supply, fill #0

## 2024-09-12 MED ORDER — ATORVASTATIN CALCIUM 80 MG PO TABS
80.0000 mg | ORAL_TABLET | Freq: Every day | ORAL | 1 refills | Status: AC
  Filled 2024-09-12: qty 90, 90d supply, fill #0

## 2024-09-12 MED ORDER — FREESTYLE LIBRE 3 PLUS SENSOR MISC
6 refills | Status: AC
  Filled 2024-09-12: qty 2, 30d supply, fill #0

## 2024-09-12 MED ORDER — METFORMIN HCL 1000 MG PO TABS
1000.0000 mg | ORAL_TABLET | Freq: Every day | ORAL | 1 refills | Status: AC
  Filled 2024-09-12: qty 90, 90d supply, fill #0

## 2024-09-12 MED ORDER — DAPAGLIFLOZIN PROPANEDIOL 10 MG PO TABS
10.0000 mg | ORAL_TABLET | Freq: Every day | ORAL | 1 refills | Status: AC
  Filled 2024-09-12: qty 90, 90d supply, fill #0

## 2024-09-13 ENCOUNTER — Ambulatory Visit: Payer: Self-pay | Admitting: Family Medicine

## 2024-09-13 LAB — MICROALBUMIN / CREATININE URINE RATIO

## 2024-09-14 ENCOUNTER — Other Ambulatory Visit: Payer: Self-pay

## 2024-09-15 ENCOUNTER — Other Ambulatory Visit: Payer: Self-pay

## 2024-09-15 LAB — CMP14+EGFR
ALT: 12 [IU]/L (ref 0–32)
AST: 14 [IU]/L (ref 0–40)
Albumin: 4.6 g/dL (ref 3.9–4.9)
Alkaline Phosphatase: 94 [IU]/L (ref 49–135)
BUN/Creatinine Ratio: 15 (ref 12–28)
BUN: 13 mg/dL (ref 8–27)
Bilirubin Total: 1.3 mg/dL — ABNORMAL HIGH (ref 0.0–1.2)
CO2: 25 mmol/L (ref 20–29)
Calcium: 10.2 mg/dL (ref 8.7–10.3)
Chloride: 102 mmol/L (ref 96–106)
Creatinine, Ser: 0.87 mg/dL (ref 0.57–1.00)
Globulin, Total: 2.5 g/dL (ref 1.5–4.5)
Glucose: 108 mg/dL — ABNORMAL HIGH (ref 70–99)
Potassium: 4 mmol/L (ref 3.5–5.2)
Sodium: 143 mmol/L (ref 134–144)
Total Protein: 7.1 g/dL (ref 6.0–8.5)
eGFR: 75 mL/min/{1.73_m2}

## 2024-09-15 LAB — LIPID PANEL
Chol/HDL Ratio: 3.1 ratio (ref 0.0–4.4)
Cholesterol, Total: 119 mg/dL (ref 100–199)
HDL: 39 mg/dL — ABNORMAL LOW
LDL Chol Calc (NIH): 51 mg/dL (ref 0–99)
Triglycerides: 175 mg/dL — ABNORMAL HIGH (ref 0–149)
VLDL Cholesterol Cal: 29 mg/dL (ref 5–40)

## 2024-09-15 LAB — MICROALBUMIN / CREATININE URINE RATIO

## 2024-09-26 ENCOUNTER — Encounter: Admitting: Physical Medicine & Rehabilitation

## 2024-10-03 ENCOUNTER — Ambulatory Visit: Payer: Self-pay | Admitting: Pharmacist

## 2025-01-02 ENCOUNTER — Ambulatory Visit: Payer: Self-pay | Admitting: Family Medicine
